# Patient Record
Sex: Female | Born: 1970 | Race: White | Hispanic: No | Smoking: Current every day smoker
Health system: Southern US, Community
[De-identification: ages and names within clinical notes are randomized; demographics above are authoritative.]

## PROBLEM LIST (undated history)

## (undated) DIAGNOSIS — F17200 Nicotine dependence, unspecified, uncomplicated: Secondary | ICD-10-CM

## (undated) DIAGNOSIS — F1910 Other psychoactive substance abuse, uncomplicated: Secondary | ICD-10-CM

## (undated) DIAGNOSIS — D649 Anemia, unspecified: Secondary | ICD-10-CM

## (undated) DIAGNOSIS — J189 Pneumonia, unspecified organism: Secondary | ICD-10-CM

## (undated) DIAGNOSIS — S66819A Strain of other specified muscles, fascia and tendons at wrist and hand level, unspecified hand, initial encounter: Secondary | ICD-10-CM

## (undated) DIAGNOSIS — F3110 Bipolar disorder, current episode manic without psychotic features, unspecified: Secondary | ICD-10-CM

## (undated) DIAGNOSIS — S62102A Fracture of unspecified carpal bone, left wrist, initial encounter for closed fracture: Secondary | ICD-10-CM

## (undated) DIAGNOSIS — F4310 Post-traumatic stress disorder, unspecified: Secondary | ICD-10-CM

## (undated) DIAGNOSIS — F419 Anxiety disorder, unspecified: Secondary | ICD-10-CM

## (undated) DIAGNOSIS — F1911 Other psychoactive substance abuse, in remission: Secondary | ICD-10-CM

## (undated) HISTORY — PX: TUBAL LIGATION: SHX77

## (undated) HISTORY — PX: WRIST SURGERY: SHX841

## (undated) HISTORY — PX: CHOLECYSTECTOMY: SHX55

---

## 1999-04-12 ENCOUNTER — Encounter: Admission: RE | Admit: 1999-04-12 | Discharge: 1999-04-12 | Payer: Self-pay | Admitting: Obstetrics

## 2000-05-14 ENCOUNTER — Emergency Department (HOSPITAL_COMMUNITY): Admission: EM | Admit: 2000-05-14 | Discharge: 2000-05-14 | Payer: Self-pay | Admitting: Internal Medicine

## 2000-05-14 ENCOUNTER — Encounter: Payer: Self-pay | Admitting: Internal Medicine

## 2001-02-02 ENCOUNTER — Emergency Department (HOSPITAL_COMMUNITY): Admission: EM | Admit: 2001-02-02 | Discharge: 2001-02-02 | Payer: Self-pay | Admitting: Emergency Medicine

## 2001-02-02 ENCOUNTER — Encounter: Payer: Self-pay | Admitting: Obstetrics

## 2001-02-03 ENCOUNTER — Ambulatory Visit (HOSPITAL_COMMUNITY): Admission: RE | Admit: 2001-02-03 | Discharge: 2001-02-03 | Payer: Self-pay | Admitting: Surgery

## 2001-02-03 ENCOUNTER — Encounter: Payer: Self-pay | Admitting: Surgery

## 2001-02-04 ENCOUNTER — Inpatient Hospital Stay (HOSPITAL_COMMUNITY): Admission: AD | Admit: 2001-02-04 | Discharge: 2001-02-04 | Payer: Self-pay | Admitting: Obstetrics

## 2001-02-05 ENCOUNTER — Encounter: Payer: Self-pay | Admitting: Surgery

## 2001-02-09 ENCOUNTER — Ambulatory Visit (HOSPITAL_COMMUNITY): Admission: RE | Admit: 2001-02-09 | Discharge: 2001-02-09 | Payer: Self-pay | Admitting: Surgery

## 2001-02-23 ENCOUNTER — Encounter: Payer: Self-pay | Admitting: Surgery

## 2001-02-23 ENCOUNTER — Observation Stay (HOSPITAL_COMMUNITY): Admission: RE | Admit: 2001-02-23 | Discharge: 2001-02-24 | Payer: Self-pay | Admitting: Surgery

## 2001-02-23 ENCOUNTER — Encounter (INDEPENDENT_AMBULATORY_CARE_PROVIDER_SITE_OTHER): Payer: Self-pay

## 2001-08-26 ENCOUNTER — Emergency Department (HOSPITAL_COMMUNITY): Admission: EM | Admit: 2001-08-26 | Discharge: 2001-08-27 | Payer: Self-pay

## 2001-08-28 ENCOUNTER — Emergency Department (HOSPITAL_COMMUNITY): Admission: EM | Admit: 2001-08-28 | Discharge: 2001-08-28 | Payer: Self-pay | Admitting: Emergency Medicine

## 2001-08-28 ENCOUNTER — Encounter: Payer: Self-pay | Admitting: Emergency Medicine

## 2001-09-20 ENCOUNTER — Emergency Department (HOSPITAL_COMMUNITY): Admission: EM | Admit: 2001-09-20 | Discharge: 2001-09-21 | Payer: Self-pay | Admitting: *Deleted

## 2001-09-23 ENCOUNTER — Emergency Department (HOSPITAL_COMMUNITY): Admission: EM | Admit: 2001-09-23 | Discharge: 2001-09-23 | Payer: Self-pay | Admitting: Emergency Medicine

## 2001-09-23 ENCOUNTER — Encounter: Payer: Self-pay | Admitting: Emergency Medicine

## 2001-10-09 ENCOUNTER — Encounter: Payer: Self-pay | Admitting: Emergency Medicine

## 2001-10-09 ENCOUNTER — Inpatient Hospital Stay (HOSPITAL_COMMUNITY): Admission: EM | Admit: 2001-10-09 | Discharge: 2001-10-12 | Payer: Self-pay | Admitting: Emergency Medicine

## 2001-10-20 ENCOUNTER — Encounter: Admission: RE | Admit: 2001-10-20 | Discharge: 2001-10-20 | Payer: Self-pay | Admitting: Internal Medicine

## 2001-12-24 ENCOUNTER — Emergency Department (HOSPITAL_COMMUNITY): Admission: EM | Admit: 2001-12-24 | Discharge: 2001-12-24 | Payer: Self-pay | Admitting: Emergency Medicine

## 2001-12-25 ENCOUNTER — Emergency Department (HOSPITAL_COMMUNITY): Admission: EM | Admit: 2001-12-25 | Discharge: 2001-12-25 | Payer: Self-pay | Admitting: Emergency Medicine

## 2001-12-25 ENCOUNTER — Encounter: Payer: Self-pay | Admitting: Emergency Medicine

## 2002-04-22 ENCOUNTER — Encounter: Payer: Self-pay | Admitting: Emergency Medicine

## 2002-04-22 ENCOUNTER — Emergency Department (HOSPITAL_COMMUNITY): Admission: EM | Admit: 2002-04-22 | Discharge: 2002-04-22 | Payer: Self-pay | Admitting: Emergency Medicine

## 2002-04-29 ENCOUNTER — Encounter: Admission: RE | Admit: 2002-04-29 | Discharge: 2002-04-29 | Payer: Self-pay | Admitting: Internal Medicine

## 2003-11-11 ENCOUNTER — Emergency Department (HOSPITAL_COMMUNITY): Admission: EM | Admit: 2003-11-11 | Discharge: 2003-11-11 | Payer: Self-pay | Admitting: Emergency Medicine

## 2003-11-26 ENCOUNTER — Emergency Department (HOSPITAL_COMMUNITY): Admission: EM | Admit: 2003-11-26 | Discharge: 2003-11-27 | Payer: Self-pay

## 2004-02-26 ENCOUNTER — Emergency Department (HOSPITAL_COMMUNITY): Admission: EM | Admit: 2004-02-26 | Discharge: 2004-02-26 | Payer: Self-pay | Admitting: Family Medicine

## 2005-08-15 ENCOUNTER — Emergency Department (HOSPITAL_COMMUNITY): Admission: EM | Admit: 2005-08-15 | Discharge: 2005-08-15 | Payer: Self-pay | Admitting: Emergency Medicine

## 2006-04-17 ENCOUNTER — Emergency Department (HOSPITAL_COMMUNITY): Admission: EM | Admit: 2006-04-17 | Discharge: 2006-04-17 | Payer: Self-pay | Admitting: Family Medicine

## 2008-04-16 ENCOUNTER — Emergency Department (HOSPITAL_COMMUNITY): Admission: EM | Admit: 2008-04-16 | Discharge: 2008-04-16 | Payer: Self-pay | Admitting: Emergency Medicine

## 2008-07-22 ENCOUNTER — Emergency Department (HOSPITAL_COMMUNITY): Admission: EM | Admit: 2008-07-22 | Discharge: 2008-07-22 | Payer: Self-pay | Admitting: Emergency Medicine

## 2008-07-25 ENCOUNTER — Emergency Department (HOSPITAL_COMMUNITY): Admission: EM | Admit: 2008-07-25 | Discharge: 2008-07-25 | Payer: Self-pay | Admitting: Family Medicine

## 2008-08-12 ENCOUNTER — Emergency Department (HOSPITAL_COMMUNITY): Admission: EM | Admit: 2008-08-12 | Discharge: 2008-08-13 | Payer: Self-pay | Admitting: Emergency Medicine

## 2008-08-30 ENCOUNTER — Emergency Department (HOSPITAL_COMMUNITY): Admission: EM | Admit: 2008-08-30 | Discharge: 2008-08-31 | Payer: Self-pay | Admitting: Emergency Medicine

## 2010-01-14 ENCOUNTER — Emergency Department (HOSPITAL_COMMUNITY): Admission: EM | Admit: 2010-01-14 | Discharge: 2010-01-14 | Payer: Self-pay | Admitting: Emergency Medicine

## 2010-04-22 ENCOUNTER — Emergency Department (HOSPITAL_COMMUNITY): Admission: EM | Admit: 2010-04-22 | Discharge: 2010-04-22 | Payer: Self-pay | Admitting: Emergency Medicine

## 2010-08-24 ENCOUNTER — Emergency Department (HOSPITAL_COMMUNITY): Admission: EM | Admit: 2010-08-24 | Discharge: 2010-08-24 | Payer: Self-pay | Admitting: Emergency Medicine

## 2010-12-02 ENCOUNTER — Emergency Department (HOSPITAL_COMMUNITY)
Admission: EM | Admit: 2010-12-02 | Discharge: 2010-12-02 | Payer: Self-pay | Source: Home / Self Care | Admitting: Emergency Medicine

## 2011-01-20 LAB — URINE MICROSCOPIC-ADD ON

## 2011-01-20 LAB — URINALYSIS, ROUTINE W REFLEX MICROSCOPIC
Glucose, UA: NEGATIVE mg/dL
Specific Gravity, Urine: 1.015 (ref 1.005–1.030)
pH: 5.5 (ref 5.0–8.0)

## 2011-01-20 LAB — PREGNANCY, URINE: Preg Test, Ur: NEGATIVE

## 2011-03-22 NOTE — Op Note (Signed)
[facility]  Patient:    MONTGOMERY, ROTHLISBERGER                       MRN: [id number] Proc. Date: 02/23/01 Adm. Date:  [id number] Disc. Date: [id number] Attending:  Charlton Haws                           Operative Report  PREOPERATIVE DIAGNOSIS:  Chronic calculus cholecystitis.  POSTOPERATIVE DIAGNOSIS:  Chronic calculus cholecystitis.  OPERATION PERFORMED:  Laparoscopic cholecystectomy with the operative cholangiogram.  SURGEON:  Dr. Jamey Ripa.  ASSISTANT:  Dr. Orpah Greek.  ANESTHESIA:  General endotracheal.  CLINICAL HISTORY:  This patient is a 40 year old who presented two weeks ago to the emergency room with abdominal pain and was found to have gallstones plus an active ulcer. She is undergoing therapy for the ulcer and some of her pain is better but some of her postprandial right sided pain has persisted.  DESCRIPTION OF PROCEDURE:  The patient was brought to the operating room and after satisfactory general endotracheal anesthesia had been obtained, the abdomen was prepped and draped. A 0.25% Marcaine was used at each incision. An umbilical incision was made first and the peritoneal cavity entered under direct vision. The Hasson was introduced and the abdomen insufflated to 15. The gallbladder was chronically inflamed with a little chronic thickening around it but there was no evidence of acute inflammation. The remainder of the abdomen looked normal but I was not able to visualize the pelvic organs.  Three additional trocars were placed in the usual positions with the patient in reverse Trendelenburg tilted to the left. The gallbladder was retracted and lifted over the liver. The area around the cystic duct was opened and what appeared to be a cystic duct was fairly large so I spent a long time trying to dissect this out and found the anterior branch of the cystic artery which once I had the clearly identified was clipped and divided to give [place] a  little more mobility. I was able to work my way around the cystic duct and I thought there appeared to be a stone wedged in it and what I thought I had was a very short cystic duct. I opened the peritoneum up on both sides of the gallbladder to make sure I couldnt find the other structures and all I could see was the posterior branch of the cystic artery. Once I thought I had the anatomy adequate identified, I put a clip above where I thought the stone was and then opened this structure and this did appear to be the cystic duct. Using a right angle, I was able to manipulate a single large stone from it and it was probably actually in the ampulla of the gallbladder. To confirm the anatomy, I placed a Reddick catheter in and held it in place with a clip and did an operative cholangiography. This showed what appeared to be a short cystic duct consistent with what the operative findings looked like and good filling of the hepatic ducts, distal common duct and duodenum with no evidence of filling defects.  The catheter was removed and four clips placed on the cystic duct. It was dilated from the stone having been in it and wanted to be sure we had adequate closure. Three clips were placed on the posterior branch of the cystic artery and these were divided. The gallbladder was removed from below to above with  coagulation current of the cautery. Just prior to disconnecting it, we irrigated it and made sure everything was dry. The gallbladder was placed in a bag because there had been some leakage around the cystic duct and I wanted to minimize contamination. This was removed and the gallbladder brought out the umbilical port.  The umbilical port was replaced and we made a final irrigation check for hemostasis and again everything seemed dry. The lateral trocars were removed and there was no bleeding. The umbilical port was closed with the pursestring and it appeared to be dry. The abdomen was  deflated through the epigastric port. The skin was closed with 4-0 monocryl subcuticular and Steri-Strips. The patient tolerated the procedure well. There were no operative complications. All counts were correct. DD:  02/23/01 TD:  02/24/01 Job: [id number]

## 2011-03-22 NOTE — Discharge Summary (Signed)
[place]  Patient:    Regina Daniel, Regina Daniel Visit Number: [id number] MRN: [id number]          Service Type: Attending:  Alvester Morin, M.D. Dictated by:   Ladell Pier, M.D. Adm. Date:  10/10/01 Disc. Date: 10/12/01                             Discharge Summary  ADMISSION DIAGNOSES: 1. Nausea and vomiting. 2. Diarrhea. 3. Substance abuse. 4. Orthostatic hypotension.  DISCHARGE DIAGNOSES: 1. Nausea and vomiting. 2. Diarrhea. 3. Substance abuse. 4. Orthostatic hypotension.  CONSULTATION:  Cardiology.  PROCEDURE:  Transesophageal echocardiogram on October 12, 2001.  HISTORY OF PRESENT ILLNESS:  [name] is a 40 year old, white female with past medical history significant for bipolar.  The patient has been having abdominal pain and nausea with diarrhea for the past four years on an off.  In April 2002, she had a cholecystectomy.  She stated that they told her at the time that she had a stomach ulcer for which she took Prevacid for two months. She came to the emergency department four times in the past three months because of abdominal pain and nausea.  She returned on the night of admission secondary to nausea, vomiting and diarrhea that started over the past 24 hours.  She had no fever, no chills or shortness of breath.  She had some lower right-sided pain that hurts when she breathes.  Her right upper quadrant pain radiated to her right side of her back.  She noted that the pain was sharp.  She also had some decreased appetite and lightheadedness.  PAST MEDICAL HISTORY: 1. Bipolar. 2. Inflammatory bowel disease. 3. Ovarian cyst.  PAST SURGICAL HISTORY: 1. BTL. 2. Cholecystectomy.  FAMILY HISTORY:  The patient was adopted.  SOCIAL HISTORY:  She smoked 1/2 pack a day since she was 40 years old. Occasional alcohol use.  She does cocaine.  She has a daughter and two sons. Her daughter lives with her sister and her two sons live  with her ex-husband. She is unemployed.  She lives with her fiance.  MEDICATIONS: 1. Tegretol.  She states she has not taken her Tegretol in a long time. 2. MiraLax.  ALLERGIES:  No known drug allergies.  REVIEW OF SYSTEMS:  Positive for weakness, headaches, nausea, vomiting, shortness of breath, cough, diarrhea, abdominal pain.  Diarrhea alternated with constipation, change in appetite.  She uses two pillows and has lightheadedness.  PHYSICAL EXAMINATION:  VITAL SIGNS:  Temperature 97.8, blood pressure 108/71, pulse 64, respiratory rate 16 with orthostatic blood pressure 108/54, pulse 59 lying down; sitting blood pressure 114/64, pulse 65; standing up blood pressure 109/64, pulse 82.  GENERAL:  The patient is laying in bed, sleepy, but awakes to voice.  HEENT:  Head normocephalic, atraumatic.  Pupils are equal, round and reactive to light.  Throat with no erythema.  CARDIOVASCULAR:  Regular rate and rhythm with no murmurs, rubs or gallops. S1, S2, no JVD or carotid bruits.  LUNGS:  Clear to auscultation bilaterally.  No wheezes, rhonchi or crackles.  ABDOMEN:  Soft, tender in the right upper quadrant to palpation.  Hypoactive bowel sounds.  RECTAL:  Heme negative, nontender.  PELVIC:  Negative, per ER physician.  EXTREMITIES:  No edema, 2+ DP pulse, 3+ DTRs.  LYMPHATICS:  No lymphadenopathy.  NEUROLOGIC:  Cranial nerves II-XII intact.  Strength 5/5 throughout.  LABORATORY DATA AND X-RAY FINDINGS:  Urine  pregnancy negative.  UA with ketones 15 mg/dl, otherwise normal.  Urine drug screen positive for cocaine. CBC with hemoglobin 13.6/39.6, WBC 8.8, platelets 193, MCV 92.6, neutrophils 81%.  BMP with sodium 136, potassium 3.6, chloride 106, CO2 25, BUN and creatinine 11/0.9, glucose 98, calcium 9.2.  Total protein 6.9, albumin 4.3, AST 26, ALT 21, Alk phos 75, total bilirubin 0.7.  Wet-prep negative.  EKG with normal sinus rhythm, normal axis, no ST segment elevation or  depression. 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  The findings suggest emboli.  Her pelvic CT showed a 2 cm cyst on the left ovary and 15 cm cyst on the right ovary.  There was a small amount of free fluid in the posterior pelvis.  Her TEE with normal LAR, ARV and LV, normal AV, MV, PV and TV.  No evidence for endocarditis.  She had a patent foramen ovale. Therefore, her TEE was normal.  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  It was determined that it is possible that the patient could have embolic source of her infarct of her kidney to her bowel.  She had a TEE done which was negative.  Most likely cause for infarct to kidney and bowel is patients cocaine use causing vasospasm.  The patient was told to discontinue use of her cocaine.  #2 - HYPOTENSION:  The patient was rehydrated during her stay and became normotensive.  Her nausea, vomiting and diarrhea resolved during her stay.  #3 - SUBSTANCE ABUSE:  The patient was offered substance abuse counseling and refused this.  She stated that she needs cocaine to numb her pain.  The patient was advised that she should follow up with me in the outpatient clinic and we will work through this.  If she is still having pain, we would try to manage her pain with pain medication which would work much better for her.  If she continues to use cocaine, she runs the risk of this happening again or something even worse.  DISPOSITION:  Discharged to home with her fiance.  CONDITION ON DISCHARGE:  Stable.  DISCHARGE MEDICATION:  Percocet 20 one p.o. q.6h. as needed for pain.  SPECIAL INSTRUCTIONS:  Do not use cocaine anymore since it is possible that she will have this happen again.   FOLLOWUP:  She  was told to follow up in the office at clinic and she will be called with appointment.  She was given a number, [id number], if she was not called within the next week. Dictated by:   Ladell Pier, M.D. Attending:  Alvester Morin, M.D. DD:  10/12/01 TD:  10/13/01 Job: [id number]

## 2011-03-22 NOTE — Consult Note (Signed)
[facility]  Patient:    Regina Daniel, Regina Daniel                       MRN: [id number] Adm. Date:  [id number] Disc. Date: [id number] Attending:  Doug Sou CC:         Charles A. Clearance Coots, M.D.   Consultation Report  REASON FOR CONSULTATION:  Abdominal pain.  CLINICAL HISTORY:  The patient is a 41 year old who came to the [facility] today to be checked out for abdominal pain.  It has been going on, what she told the nurses there, for one to two weeks and seems to radiate into her back.  It is sometimes associated with some nausea but she was currently hungry and is hungry this evening when I am seeing her at 2145 hours.  Patient actually relates that this pain has been going on off and on for four years.  At one time she was told she had a cyst in her ovary but that that was not the cause of her pain.  When she points, she points to about even with her umbilicus on the right side as the source of her pain but it does radiate up. It is no worse today than yesterday, the day before or last week, is not exacerbated any whatsoever and because she has been chronically complaining of this pain, her father made her come in for evaluation today.  She has had no fever or chills, no diarrhea, etc.  She has not had any other GI complaints.  PAST MEDICAL HISTORY:  She is a gravida 3.  She had a tubal ligation after her last child was born.  She has also had some hand surgery apparently.  PRIOR MEDICATIONS:  None.  ALLERGIES:  None known.  HABITS:  She smokes about a half pack per day and drinks occasional alcohol. The nurses at [facility] got a history positive for marijuana and crack.  REVIEW OF SYSTEMS:  HEENT:  Basically negative.  CHEST:  No cough or shortness of breath.  HEART:  Negative.  ABDOMEN:  Negative except for HPI.  GU: Negative.  EXTREMITIES:  Negative.  PHYSICAL EXAMINATION:  GENERAL:  This patient is a thin, otherwise healthy-appearing  40 year old who is alert, oriented and in no acute distress.  Vital signs have been unremarkable in the emergency room.  Her main complaint currently is that she is hungry and is tired from having been evaluated since 1:00 this afternoon.  HEENT:  Her head is normocephalic.  Eyes are nonicteric.  Pupils are round and regular.  NECK:  Supple.  No masses or thyromegaly.  LUNGS:  Clear to auscultation.  HEART:  Regular.  No murmurs, rubs, or gallops are heard.  ABDOMEN:  Soft.  She does have some tenderness in the right mid-abdomen parallel with the umbilicus.  The right subcostal area seems to be less tender actually.  She has no guarding, no rebound and bowel sounds are normal.  PELVIC:  Pelvic was negative at [facility], not repeated by me.  EXTREMITIES:  Good range of motion.  No evidence of any varicose veins, edema, etc.  LABORATORY DATA:  Laboratory studies are basically negative.  Her WBC is normal.  Liver functions are normal.  Amylase and lipase are normal.  Pelvic ultrasound was basically unremarkable.  Gallbladder ultrasound done here shows cholelithiasis with no evidence of acute cholecystitis.  Gallbladder is not tense or distended, no thick wall, no pericholecystic fluid.  IMPRESSION:  Abdominal pain, probably biliary in origin, although her tenderness seems a little low for gallbladder.  PLAN:  I would like to just get an upper GI and small-bowel follow-through to make sure we do not miss any other GI problem and we will try to set that up for tomorrow and then subsequently try to get her set up for a laparoscopic cholecystectomy.  I have discussed the cholecystectomy with her and gone over indications, risks and complications, etc.  I think all questions have been answered.  I am going to give her ______  Vicodins for pain should she need it in the interval between now and when we get her set up for surgery and she will follow up with our office tomorrow. DD:   02/02/01 TD:  02/03/01 Job: [id number]

## 2011-08-01 LAB — CBC
HCT: 37.8
MCV: 93.1
Platelets: 159
RBC: 4.06
WBC: 8.3

## 2011-08-01 LAB — URINALYSIS, ROUTINE W REFLEX MICROSCOPIC
Bilirubin Urine: NEGATIVE
Ketones, ur: NEGATIVE
Nitrite: NEGATIVE
pH: 6.5

## 2011-08-01 LAB — COMPREHENSIVE METABOLIC PANEL
BUN: 6
CO2: 26
Chloride: 107
Creatinine, Ser: 0.86
GFR calc non Af Amer: >60
Glucose, Bld: 83
Total Bilirubin: 0.6

## 2011-08-01 LAB — DIFFERENTIAL
Basophils Absolute: 0
Lymphocytes Relative: 14
Neutro Abs: 6.3
Neutrophils Relative %: 76

## 2011-08-01 LAB — POCT PREGNANCY, URINE: Operator id: 196461

## 2011-08-01 LAB — URINE MICROSCOPIC-ADD ON

## 2011-08-01 LAB — WET PREP, GENITAL: Yeast Wet Prep HPF POC: NONE SEEN

## 2011-08-01 LAB — GC/CHLAMYDIA PROBE AMP, GENITAL: GC Probe Amp, Genital: NEGATIVE

## 2011-08-05 LAB — COMPREHENSIVE METABOLIC PANEL
ALT: 13
AST: 14
Alkaline Phosphatase: 74
CO2: 26
Calcium: 9.1
Chloride: 102
GFR calc non Af Amer: 55 — ABNORMAL LOW
Glucose, Bld: 90
Sodium: 135
Total Bilirubin: 0.6

## 2011-08-05 LAB — POCT PREGNANCY, URINE: Preg Test, Ur: NEGATIVE

## 2011-08-05 LAB — URINALYSIS, ROUTINE W REFLEX MICROSCOPIC
Bilirubin Urine: NEGATIVE
Glucose, UA: NEGATIVE
Protein, ur: 30 — AB
Protein, ur: NEGATIVE
Specific Gravity, Urine: 1.012
Specific Gravity, Urine: 1.015
pH: 6

## 2011-08-05 LAB — URINE CULTURE

## 2011-08-05 LAB — DIFFERENTIAL
Basophils Absolute: 0
Basophils Relative: 0
Eosinophils Absolute: 0
Eosinophils Relative: 0
Neutrophils Relative %: 79 — ABNORMAL HIGH

## 2011-08-05 LAB — URINE MICROSCOPIC-ADD ON

## 2011-08-05 LAB — CBC
Platelets: 172
WBC: 11.2 — ABNORMAL HIGH

## 2011-08-05 LAB — LIPASE, BLOOD: Lipase: 17

## 2011-11-23 ENCOUNTER — Emergency Department (HOSPITAL_COMMUNITY)
Admission: EM | Admit: 2011-11-23 | Discharge: 2011-11-23 | Disposition: A | Discharge status: Home / Self Care | Payer: Self-pay | Attending: Emergency Medicine | Admitting: Emergency Medicine

## 2011-11-23 DIAGNOSIS — R109 Unspecified abdominal pain: Secondary | ICD-10-CM | POA: Insufficient documentation

## 2011-11-23 DIAGNOSIS — N390 Urinary tract infection, site not specified: Secondary | ICD-10-CM | POA: Insufficient documentation

## 2011-11-23 DIAGNOSIS — R300 Dysuria: Secondary | ICD-10-CM | POA: Insufficient documentation

## 2011-11-23 DIAGNOSIS — R350 Frequency of micturition: Secondary | ICD-10-CM | POA: Insufficient documentation

## 2011-11-23 DIAGNOSIS — R3915 Urgency of urination: Secondary | ICD-10-CM | POA: Insufficient documentation

## 2011-11-23 LAB — CBC
Hemoglobin: 12.5 g/dL (ref 12.0–15.0)
MCH: 32.6 pg (ref 26.0–34.0)
MCHC: 35.2 g/dL (ref 30.0–36.0)
RDW: 12.3 % (ref 11.5–15.5)

## 2011-11-23 LAB — POCT I-STAT, CHEM 8
Creatinine, Ser: 1 mg/dL (ref 0.50–1.10)
Glucose, Bld: 86 mg/dL (ref 70–99)
HCT: 36 % (ref 36.0–46.0)
Hemoglobin: 12.2 g/dL (ref 12.0–15.0)
TCO2: 27 mmol/L (ref 0–100)

## 2011-11-23 LAB — DIFFERENTIAL
Basophils Absolute: 0 10*3/uL (ref 0.0–0.1)
Basophils Relative: 1 % (ref 0–1)
Eosinophils Absolute: 0.2 10*3/uL (ref 0.0–0.7)
Monocytes Absolute: 0.5 10*3/uL (ref 0.1–1.0)
Monocytes Relative: 7 % (ref 3–12)
Neutro Abs: 3.8 10*3/uL (ref 1.7–7.7)
Neutrophils Relative %: 59 % (ref 43–77)

## 2011-11-23 LAB — URINE CULTURE: Colony Count: >=100000

## 2011-11-23 LAB — URINALYSIS, ROUTINE W REFLEX MICROSCOPIC
Bilirubin Urine: NEGATIVE
Ketones, ur: NEGATIVE mg/dL
Nitrite: POSITIVE — AB
Protein, ur: 30 mg/dL — AB

## 2011-11-23 LAB — URINE MICROSCOPIC-ADD ON

## 2011-11-23 MED ORDER — HYDROMORPHONE HCL PF 1 MG/ML IJ SOLN: 0.5000 mg | Freq: Once | INTRAMUSCULAR | Status: DC

## 2011-11-23 MED ORDER — CIPROFLOXACIN HCL 500 MG PO TABS: 500.0000 mg | ORAL_TABLET | Freq: Two times a day (BID) | ORAL | Status: AC

## 2011-11-23 MED ORDER — CIPROFLOXACIN HCL 500 MG PO TABS
500.0000 mg | ORAL_TABLET | Freq: Once | ORAL | Status: AC
  Administered 2011-11-23: 500 mg via ORAL
  Filled 2011-11-23: qty 1

## 2011-11-23 NOTE — ED Provider Notes (Signed)
History/physical exam/procedure(s) were performed by non-physician practitioner and as supervising physician I was immediately available for consultation/collaboration. I have reviewed all notes and am in agreement with care and plan.   Hilario Quarry, MD 11/23/11 [phone]

## 2011-11-23 NOTE — ED Provider Notes (Signed)
History     CSN: [id number]  Arrival date & time 11/23/11  1806   First MD Initiated Contact with Patient 11/23/11 1830      No chief complaint on file.   (Consider location/radiation/quality/duration/timing/severity/associated sxs/prior treatment) Patient is a 41 y.o. female presenting with frequency. The history is provided by the patient.  Urinary Frequency This is a new problem. The current episode started in the past 7 days. The problem occurs constantly. The problem has been gradually worsening. Associated symptoms include abdominal pain. Pertinent negatives include no chills, fever, nausea or vomiting.  Pt states she has had abdominal pressure, her urine is cloudy, and it is burning when she is urinating. Pt denies fever, chills, malaise, flank pain. 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   No past medical history on file.  No past surgical history on file.  No family history on file.  History  Substance Use Topics  . Smoking status: Not on file  . Smokeless tobacco: Not on file  . Alcohol Use: Not on file    OB History    No data available      Review of Systems  Constitutional: Negative for fever and chills.  HENT: Negative.   Eyes: Negative.   Respiratory: Negative.   Cardiovascular: Negative.   Gastrointestinal: Positive for abdominal pain. Negative for nausea and vomiting.  Genitourinary: Positive for dysuria, urgency and frequency. Negative for hematuria, flank pain, vaginal bleeding, vaginal discharge and pelvic pain.  Musculoskeletal: Negative.   Skin: Negative.   Neurological: Negative.   Psychiatric/Behavioral: Negative.     Allergies  Review of patient's allergies indicates no known allergies.  Home Medications   Current Outpatient Rx  Name Route Sig Dispense Refill  . ASPIRIN 81 MG PO TABS Oral Take 160 mg by mouth daily.      BP 90/47  Pulse 65  Temp(Src) 97.7 F (36.5 C)  (Oral)  Resp 16  SpO2 100%  Physical Exam  Nursing note and vitals reviewed. Constitutional: She is oriented to person, place, and time. She appears well-developed and well-nourished. No distress.  HENT:  Head: Normocephalic and atraumatic.  Eyes: Conjunctivae are normal.  Neck: Neck supple.  Cardiovascular: Normal rate, regular rhythm and normal heart sounds.   Pulmonary/Chest: Effort normal and breath sounds normal. No respiratory distress.  Abdominal: Soft. Bowel sounds are normal. She exhibits no distension. There is no tenderness.  Genitourinary:       No cva tenderness  Musculoskeletal: Normal range of motion.  Neurological: She is alert and oriented to person, place, and time.  Skin: Skin is warm and dry.  Psychiatric: She has a normal mood and affect.    ED Course  Procedures (including critical care time)  Labs Reviewed  URINALYSIS, ROUTINE W REFLEX MICROSCOPIC - Abnormal; Notable for the following:    APPearance CLOUDY (*)    Hgb urine dipstick SMALL (*)    Protein, ur 30 (*)    Nitrite POSITIVE (*)    Leukocytes, UA LARGE (*)    All other components within normal limits  CBC - Abnormal; Notable for the following:    RBC 3.83 (*)    HCT 35.5 (*)    All other components within normal limits  URINE MICROSCOPIC-ADD ON - Abnormal; Notable for the following:    Bacteria, UA MANY (*)    All other components within normal limits  DIFFERENTIAL  POCT PREGNANCY, URINE  POCT  I-STAT, CHEM 8  POCT PREGNANCY, URINE  I-STAT, CHEM 8  URINE CULTURE   Urine infected. Normal renal function. WIll treat, started on cipro. Cultures sent. Pt has lowe  Blood pressure, 90/47, however pt states her bp is always this low, and even warned nurse about it, before it was taken the first time. Pt has no sings of pyelonephritis, no vomiting, no flank pain, no fever. WIll d/c   No diagnosis found.    MDM          Lottie Mussel, PA 11/23/11 2044

## 2011-11-23 NOTE — ED Notes (Signed)
Patient stable upon discharge.  

## 2011-11-23 NOTE — ED Notes (Signed)
Pt states " my blood pressure is normally low so don' freak out". Pt placed on cardiac monitor and BP cycling for precaution.

## 2011-11-26 NOTE — ED Notes (Signed)
+   Urine Patient treated with Cipro-sensitive to same-chart appended per protocol MD. 

## 2011-11-28 ENCOUNTER — Encounter (HOSPITAL_COMMUNITY): Payer: Self-pay | Admitting: Emergency Medicine

## 2012-04-19 ENCOUNTER — Encounter (HOSPITAL_COMMUNITY): Payer: Self-pay | Admitting: Adult Health

## 2012-04-19 ENCOUNTER — Emergency Department (HOSPITAL_COMMUNITY)
Admission: EM | Admit: 2012-04-19 | Discharge: 2012-04-19 | Disposition: A | Discharge status: Home / Self Care | Payer: Self-pay | Attending: Emergency Medicine | Admitting: Emergency Medicine

## 2012-04-19 DIAGNOSIS — W108XXA Fall (on) (from) other stairs and steps, initial encounter: Secondary | ICD-10-CM | POA: Insufficient documentation

## 2012-04-19 DIAGNOSIS — F17200 Nicotine dependence, unspecified, uncomplicated: Secondary | ICD-10-CM | POA: Insufficient documentation

## 2012-04-19 DIAGNOSIS — S0181XA Laceration without foreign body of other part of head, initial encounter: Secondary | ICD-10-CM

## 2012-04-19 DIAGNOSIS — S0180XA Unspecified open wound of other part of head, initial encounter: Secondary | ICD-10-CM | POA: Insufficient documentation

## 2012-04-19 HISTORY — DX: Other psychoactive substance abuse, uncomplicated: F19.10

## 2012-04-19 MED ORDER — TETANUS-DIPHTH-ACELL PERTUSSIS 5-2.5-18.5 LF-MCG/0.5 IM SUSP
0.5000 mL | Freq: Once | INTRAMUSCULAR | Status: AC
  Administered 2012-04-19: 0.5 mL via INTRAMUSCULAR
  Filled 2012-04-19: qty 0.5

## 2012-04-19 MED ORDER — OXYMETAZOLINE HCL 0.05 % NA SOLN
1.0000 | Freq: Once | NASAL | Status: AC
  Administered 2012-04-19: 1 via NASAL
  Filled 2012-04-19: qty 15

## 2012-04-19 NOTE — ED Notes (Signed)
C/o fall down 12  Carpeted steps after tripping,  that happened one hour ago denies Loss of consciousness, denies neck pain, denies nausea. LAceration to forehead approx inch, bleeding controlled. Pt alert and oriented, PERRLA.

## 2012-04-19 NOTE — Discharge Instructions (Signed)
Facial Laceration A facial laceration is a cut on the face. It can take 1 to 2 years for the scar to heal completely. HOME CARE  For stitches (sutures):  Keep the cut clean and dry.   If you have a bandage (dressing), change it at least once a day. Change the bandage if it gets wet or dirty, or as told by your doctor.   Wash the cut with soap and water 2 times a day. Rinse the cut with water. Pat it dry with a clean towel.   Put a thin layer of medicated cream on the cut as told by your doctor.   You may shower after the first 24 hours. Do not soak the cut in water until the stitches are removed.   Only take medicines as told by your doctor.   Have your stitches removed as told by your doctor.   Do not wear makeup until a few days after your stitches are removed.  For skin adhesive strips:  Keep the cut clean and dry.   Do not get the strips wet. You may take a bath, but be careful to keep the cut dry.   If the cut gets wet, pat it dry with a clean towel.   The strips will fall off on their own. Do not remove the strips that are still stuck to the cut.  For wound glue:  You may shower or take baths. Do not soak or scrub the cut. Do not swim. Avoid heavy sweating until the glue falls off on its own. After a shower or bath, pat the cut dry with a clean towel.   Do not put medicine or makeup on your cut until the glue falls off.   If you have a bandage, do not put tape over the glue.   Avoid lots of sunlight or tanning lamps until the glue falls off. Put sunscreen on the cut for the first year to reduce your scar.   The glue will fall off on its own. Do not pick at the glue.  You may need a tetanus shot if:  You cannot remember when you had your last tetanus shot.   You have never had a tetanus shot.  If you need a tetanus shot and you choose not to have one, you may get tetanus. Sickness from tetanus can be serious. GET HELP RIGHT AWAY IF:   Your cut gets red, painful,  or puffy (swollen).   There is yellowish-white fluid (pus) coming from the cut.   You have chills or a fever.  MAKE SURE YOU:   Understand these instructions.   Will watch your condition.   Will get help right away if you are not doing well or get worse.  Document Released: 04/08/2008 Document Revised: 10/10/2011 Document Reviewed: 04/16/2011 [employer] Patient Information 2012 [employer]. 

## 2012-04-19 NOTE — ED Provider Notes (Addendum)
History  This chart was scribed for Gwyneth Sprout, MD by Cherlynn Perches. The patient was seen in room STRE8/STRE8. Patient's care was started at 1613.   CSN: [id number]  Arrival date & time 04/19/12  1613   First MD Initiated Contact with Patient 04/19/12 1631      Chief Complaint  Patient presents with  . Head Laceration    (Consider location/radiation/quality/duration/timing/severity/associated sxs/prior treatment) HPI  Regina Daniel is a 41 y.o. female who presents to the Emergency Department complaining of a laceration to her forehead that occurred about 1 hour ago with associated pain localized to the forehead and swelling of the nose. Pt states that she tripped and fell down 12 carpeted steps, hitting her head on the wall. Pt denies LOC, neck pain, nausea, and nose pain. Pt's last tetanus shot was over 10 years ago.   Past Medical History  Diagnosis Date  . Renal disorder   . Drug abuse     History reviewed. No pertinent past surgical history.  History reviewed. No pertinent family history.  History  Substance Use Topics  . Smoking status: Current Everyday Smoker    Types: Cigarettes  . Smokeless tobacco: Not on file  . Alcohol Use: No    OB History    Grav Para Term Preterm Abortions TAB SAB Ect Mult Living                  Review of Systems  Constitutional: Negative for fever and chills.  HENT: Negative for nosebleeds and neck pain.        Head laceration, nose swelling  Respiratory: Negative for shortness of breath.   Gastrointestinal: Negative for nausea and vomiting.  Skin: Positive for wound.  Neurological: Negative for weakness.    Allergies  Review of patient's allergies indicates no known allergies.  Home Medications   Current Outpatient Rx  Name Route Sig Dispense Refill  . ACETAMINOPHEN 500 MG PO TABS Oral Take 1,000 mg by mouth every 6 (six) hours as needed. For pain      BP 117/77  Pulse 94  Temp 98.2 F (36.8 C) (Oral)  Resp  22  SpO2 97%  Physical Exam  Nursing note and vitals reviewed. Constitutional: She is oriented to person, place, and time. She appears well-developed and well-nourished. No distress.  HENT:  Head: Normocephalic and atraumatic.         6 cm jagged wound on forehead, eyes and neck normal   Eyes: EOM are normal. Right eye exhibits no discharge. Left eye exhibits no discharge.  Neck: Neck supple. No tracheal deviation present.  Cardiovascular: Normal rate.   Pulmonary/Chest: Effort normal. No respiratory distress.  Musculoskeletal: Normal range of motion.  Neurological: She is alert and oriented to person, place, and time.  Skin: Skin is warm and dry.  Psychiatric: She has a normal mood and affect. Her behavior is normal.    ED Course  Procedures (including critical care time)  DIAGNOSTIC STUDIES: Oxygen Saturation is 97% on room air, adequate by my interpretation.    COORDINATION OF CARE: 4:37PM - Will suture laceration. Pt agrees with plan.    Labs Reviewed - No data to display No results found. LACERATION REPAIR Performed by: Gwyneth Sprout Authorized byGwyneth Sprout Consent: Verbal consent obtained. Risks and benefits: risks, benefits and alternatives were discussed Consent given by: patient Patient identity confirmed: provided demographic data Prepped and Draped in normal sterile fashion Wound explored  Laceration Location: Right fourth  Laceration Length: 6 cm  No Foreign Bodies seen or palpated  Anesthesia: local infiltration  Local anesthetic: lidocaine 1 % with epinephrine  Anesthetic total: 5 ml  Irrigation method: syringe Amount of cleaning: standard  Skin closure: 6.0 Prolene   Number of sutures: 10   Technique: Simple interrupted   Patient tolerance: Patient tolerated the procedure well with no immediate complications.   1. Facial laceration       MDM   Patient with mechanical fall down the stairs without LOC. She has a  laceration to her forehead but normal vision. Also mild swelling of her nasal bridge but no deformities. There is no deviated septum or septal hematoma. Rest of her exam is within normal limits. Was repaired and tetanus is updated.       Gwyneth Sprout, MD 04/19/12 1710  Gwyneth Sprout, MD 04/19/12 1712

## 2012-04-24 ENCOUNTER — Emergency Department (HOSPITAL_COMMUNITY)
Admission: EM | Admit: 2012-04-24 | Discharge: 2012-04-24 | Disposition: A | Discharge status: Home / Self Care | Payer: Self-pay | Attending: Emergency Medicine | Admitting: Emergency Medicine

## 2012-04-24 ENCOUNTER — Encounter (HOSPITAL_COMMUNITY): Payer: Self-pay | Admitting: Emergency Medicine

## 2012-04-24 DIAGNOSIS — Z4802 Encounter for removal of sutures: Secondary | ICD-10-CM

## 2012-04-24 DIAGNOSIS — F17200 Nicotine dependence, unspecified, uncomplicated: Secondary | ICD-10-CM | POA: Insufficient documentation

## 2012-04-24 NOTE — ED Notes (Signed)
Patient is returning from Sundays vist to have sutures removed from forehead

## 2012-04-24 NOTE — ED Provider Notes (Signed)
History     CSN: [id number]  Arrival date & time 04/24/12  7829   First MD Initiated Contact with Patient 04/24/12 1034      Chief Complaint  Patient presents with  . Suture / Staple Removal    (Consider location/radiation/quality/duration/timing/severity/associated sxs/prior treatment) HPI History from patient. 41 year old female who presents for suture removal. She tripped and fell down some steps on Sunday, 5 days ago, and suffered a laceration to her forehead. She was seen in the ED here and sutures were placed. She reports no drainage or redness to the area. She has not had fever or chills. Denies any nausea or vomiting since the fall. She complains of some intermittent dizziness which seems to be improving.  Past Medical History  Diagnosis Date  . Drug abuse   . Renal disorder     Past Surgical History  Procedure Date  . Cholecystectomy     No family history on file.  History  Substance Use Topics  . Smoking status: Current Everyday Smoker    Types: Cigarettes  . Smokeless tobacco: Not on file  . Alcohol Use: No    OB History    Grav Para Term Preterm Abortions TAB SAB Ect Mult Living                  Review of Systems as per history of present illness  Allergies  Review of patient's allergies indicates no known allergies.  Home Medications   Current Outpatient Rx  Name Route Sig Dispense Refill  . ACETAMINOPHEN 500 MG PO TABS Oral Take 1,000 mg by mouth every 6 (six) hours as needed. For pain      BP 99/40  Pulse 57  Temp 97.9 F (36.6 C) (Oral)  Resp 16  SpO2 100%  Physical Exam  Nursing note and vitals reviewed. Constitutional: She appears well-developed and well-nourished. No distress.  HENT:  Head: Normocephalic and atraumatic.  Neck: Normal range of motion.  Cardiovascular: Normal rate.   Pulmonary/Chest: Effort normal.  Musculoskeletal: Normal range of motion.  Neurological: She is alert.  Skin: Skin is warm and dry. She is not  diaphoretic.       6 cm laceration to the forehead with sutures in place. No drainage from the area. Edges are pink.  Psychiatric: She has a normal mood and affect.    ED Course  SUTURE REMOVAL Performed by: Grant Fontana Authorized by: Grant Fontana Consent: Verbal consent obtained. Risks and benefits: risks, benefits and alternatives were discussed Body area: head/neck Location details: forehead Wound Appearance: clean and pink Sutures Removed: 10 Post-removal: antibiotic ointment applied Facility: sutures placed in this facility Patient tolerance: Patient tolerated the procedure well with no immediate complications.   (including critical care time)   Labs Reviewed - No data to display No results found.   1. Visit for suture removal       MDM  No evidence of infection to the wound. Sutures removed which pt tolerated well. Wound care and return precautions discussed.        Grant Fontana, PA-C 04/24/12 1106

## 2012-04-24 NOTE — ED Provider Notes (Signed)
Medical screening examination/treatment/procedure(s) were performed by non-physician practitioner and as supervising physician I was immediately available for consultation/collaboration.   Loren Racer, MD 04/24/12 [phone]

## 2012-04-24 NOTE — Discharge Instructions (Signed)
Your stitches have been removed. Apply Neosporin or Bacitracin to the area for the next several days to help with wound healing. Apply vitamin E, cocoa butter, or Mederma to help with scarring. Use extra sunscreen on the scar when out in the sun to avoid the scar darkening. If you notice pus from the wound, redness of the surrounding skin, fever, swelling, or other concerns about worsening condition, return to the ED.   Scar Minimization You will have a scar anytime you have surgery and a cut is made in the skin or you have something removed from your skin (mole, skin cancer, cyst). Although scars are unavoidable following surgery, there are ways to minimize their appearance. It is important to follow all the instructions you receive from your caregiver about wound care. How your wound heals will influence the appearance of your scar. If you do not follow the wound care instructions as directed, complications such as infection may occur. Wound instructions include keeping the wound clean, moist, and not letting the wound form a scab. Some people form scars that are raised and lumpy (hypertrophic) or larger than the initial wound (keloidal). HOME CARE INSTRUCTIONS   Follow wound care instructions as directed.   Keep the wound clean by washing it with soap and water.   Keep the wound moist with provided antibiotic cream or petroleum jelly until completely healed. Moisten twice a day for about 2 weeks.   Get stitches (sutures) taken out at the scheduled time.   Avoid touching or manipulating your wound unless needed. Wash your hands thoroughly before and after touching your wound.   Follow all restrictions such as limits on exercise or work. This depends on where your scar is located.   Keep the scar protected from sunburn. Cover the scar with sunscreen/sunblock with SPF 30 or higher.   Gently massage the scar using a circular motion to help minimize the appearance of the scar. Do this only after  the wound has closed and all the sutures have been removed.   For hypertrophic or keloidal scars, there are several ways to treat and minimize their appearance. Methods include compression therapy, intralesional corticosteroids, laser therapy, or surgery. These methods are performed by your caregiver.  Remember that the scar may appear lighter or darker than your normal skin color. This difference in color should even out with time. SEEK MEDICAL CARE IF:   You have a fever.   You develop signs of infection such as pain, redness, pus, and warmth.   You have questions or concerns.  Document Released: 04/10/2010 Document Revised: 10/10/2011 Document Reviewed: 04/10/2010 [facility] Patient Information 2012 [place], [place].

## 2014-04-20 ENCOUNTER — Other Ambulatory Visit: Payer: Self-pay | Admitting: Family Medicine

## 2014-04-20 DIAGNOSIS — R102 Pelvic and perineal pain: Secondary | ICD-10-CM

## 2014-04-22 ENCOUNTER — Other Ambulatory Visit: Payer: Self-pay

## 2014-05-04 ENCOUNTER — Ambulatory Visit
Admission: RE | Admit: 2014-05-04 | Discharge: 2014-05-04 | Disposition: A | Discharge status: Home / Self Care | Payer: No Typology Code available for payment source | Source: Ambulatory Visit | Attending: Family Medicine | Admitting: Family Medicine

## 2014-05-04 DIAGNOSIS — R102 Pelvic and perineal pain: Secondary | ICD-10-CM

## 2014-05-16 ENCOUNTER — Emergency Department (HOSPITAL_COMMUNITY)
Admission: EM | Admit: 2014-05-16 | Discharge: 2014-05-16 | Disposition: A | Discharge status: Home / Self Care | Payer: Self-pay | Attending: Emergency Medicine | Admitting: Emergency Medicine

## 2014-05-16 ENCOUNTER — Encounter (HOSPITAL_COMMUNITY): Payer: Self-pay | Admitting: Emergency Medicine

## 2014-05-16 ENCOUNTER — Emergency Department (HOSPITAL_COMMUNITY): Payer: Self-pay

## 2014-05-16 DIAGNOSIS — IMO0002 Reserved for concepts with insufficient information to code with codable children: Secondary | ICD-10-CM

## 2014-05-16 DIAGNOSIS — Y9389 Activity, other specified: Secondary | ICD-10-CM | POA: Insufficient documentation

## 2014-05-16 DIAGNOSIS — Y929 Unspecified place or not applicable: Secondary | ICD-10-CM | POA: Insufficient documentation

## 2014-05-16 DIAGNOSIS — Z87448 Personal history of other diseases of urinary system: Secondary | ICD-10-CM | POA: Insufficient documentation

## 2014-05-16 DIAGNOSIS — W268XXA Contact with other sharp object(s), not elsewhere classified, initial encounter: Secondary | ICD-10-CM | POA: Insufficient documentation

## 2014-05-16 DIAGNOSIS — S61209A Unspecified open wound of unspecified finger without damage to nail, initial encounter: Secondary | ICD-10-CM | POA: Insufficient documentation

## 2014-05-16 DIAGNOSIS — F17200 Nicotine dependence, unspecified, uncomplicated: Secondary | ICD-10-CM | POA: Insufficient documentation

## 2014-05-16 MED ORDER — OXYCODONE-ACETAMINOPHEN 5-325 MG PO TABS
1.0000 | ORAL_TABLET | Freq: Once | ORAL | Status: AC
  Administered 2014-05-16: 1 via ORAL
  Filled 2014-05-16: qty 1

## 2014-05-16 NOTE — Discharge Instructions (Signed)

## 2014-05-16 NOTE — ED Provider Notes (Signed)
CSN: [id number]     Arrival date & time 05/16/14  1447 History  This chart was scribed for non-physician practitioner, Roxy Horsemanobert Yanna Leaks, PA-C working with Derwood KaplanAnkit Nanavati, MD by Greggory StallionKayla Andersen, ED scribe. This patient was seen in room TR11C/TR11C and the patient's care was started at 4:18 PM.   Chief Complaint  Patient presents with  . finger laceration    The history is provided by the patient. No language interpreter was used.   HPI Comments: Regina Daniel is a 43 y.o. female who presents to the Emergency Department complaining of left index finger laceration that occurred about one hour ago. Pt was cutting hedges, slipped and cut her finger with the hedge trimmer. Mild pain.  No radiating symptoms. States her last tetanus has been within the last five years. Pt is right hand dominant.   Past Medical History  Diagnosis Date  . Drug abuse   . Renal disorder    Past Surgical History  Procedure Laterality Date  . Cholecystectomy     No family history on file. History  Substance Use Topics  . Smoking status: Current Every Day Smoker    Types: Cigarettes  . Smokeless tobacco: Not on file  . Alcohol Use: No   OB History   Grav Para Term Preterm Abortions TAB SAB Ect Mult Living                 Review of Systems  Constitutional: Negative for fever.  HENT: Negative for congestion.   Eyes: Negative for redness.  Respiratory: Negative for shortness of breath.   Cardiovascular: Negative for chest pain.  Gastrointestinal: Negative for abdominal distention.  Musculoskeletal: Negative for gait problem.  Skin: Positive for wound.  Neurological: Negative for speech difficulty.  Psychiatric/Behavioral: Negative for confusion.   Allergies  Review of patient's allergies indicates no known allergies.  Home Medications   Prior to Admission medications   Medication Sig Start Date End Date Taking? Authorizing Provider  acetaminophen (TYLENOL) 500 MG tablet Take 1,000 mg by mouth every 6  (six) hours as needed. For pain    Historical Provider, MD   BP 126/84  Pulse 81  Temp(Src) 97.9 F (36.6 C) (Oral)  Resp 18  Ht 5\' 2"  (1.575 m)  Wt 115 lb (52.164 kg)  BMI 21.03 kg/m2  SpO2 99%  Physical Exam  Nursing note and vitals reviewed. Constitutional: She is oriented to person, place, and time. She appears well-developed and well-nourished. No distress.  HENT:  Head: Normocephalic and atraumatic.  Eyes: Conjunctivae and EOM are normal.  Cardiovascular: Normal rate and regular rhythm.   Pulmonary/Chest: Effort normal and breath sounds normal. No stridor. No respiratory distress.  Abdominal: She exhibits no distension.  Musculoskeletal: She exhibits no edema.  Neurological: She is alert and oriented to person, place, and time. No cranial nerve deficit.  Skin: Skin is warm and dry.  Left index finger remarkable for a 1 cm jagged laceration over the palmar aspect of the DIP, no evidence of tendon or ligament involvement. No retained foreign body. Bleeding is controlled.   Psychiatric: She has a normal mood and affect.    ED Course  Procedures (including critical care time)  LACERATION REPAIR PROCEDURE NOTE The patient's identification was confirmed and consent was obtained. This procedure was performed by Roxy Horsemanobert Charlynn Salih, PA-C at 5:29 PM. Site: left index finger Sterile procedures observed Anesthetic used (type and amt): 2% lidocaine without epi Suture type/size: 5-0 Vicryl Plus Length: 1 cm # of Sutures: 3  Technique: simple interrupted Complexity: simple Antibx ointment applied Tetanus UTD Site anesthetized, irrigated with NS, explored without evidence of foreign body, wound well approximated, site covered with dry, sterile dressing.  Patient tolerated procedure well without complications. Instructions for care discussed verbally and patient provided with additional written instructions for homecare and f/u.  DIAGNOSTIC STUDIES: Oxygen Saturation is 99% on RA,  normal by my interpretation.    COORDINATION OF CARE: 4:19 PM-Discussed treatment plan which includes cleaning wound, xray and laceration repair with pt at bedside and pt agreed to plan.   Labs Review Labs Reviewed - No data to display  Imaging Review Dg Finger Index Left  05/16/2014   CLINICAL DATA:  Left index finger injury  EXAM: LEFT INDEX FINGER 2+V  COMPARISON:  None  FINDINGS: There is no acute fracture nor dislocation. The interphalangeal joints are intact. No foreign bodies are demonstrated. There is mild soft tissue swelling along the radial aspect of the DIP joint.  IMPRESSION: There is no acute bony abnormality nor evidence retained radiopaque foreign bodies.   Electronically Signed   By: David  [place]   On: 05/16/2014 16:32     EKG Interpretation None      MDM   Final diagnoses:  Laceration      I personally performed the services described in this documentation, which was scribed in my presence. The recorded information has been reviewed and is accurate.  Roxy Horseman, PA-C 05/16/14 2123

## 2014-05-16 NOTE — ED Notes (Signed)
Declined W/C at D/C and was escorted to lobby by RN. 

## 2014-05-16 NOTE — ED Notes (Signed)
Per EMS, patient was cutting hedges and slipped and cut her finger with hedge trimmer.   Patient claims she cut her L index finger.  Patient has wrapped in gauze and bleeding controlled at this time.

## 2014-05-18 NOTE — ED Provider Notes (Signed)
Medical screening examination/treatment/procedure(s) were performed by non-physician practitioner and as supervising physician I was immediately available for consultation/collaboration.   EKG Interpretation None       Monroe Toure, MD 05/18/14 1232 

## 2014-06-13 ENCOUNTER — Emergency Department (HOSPITAL_COMMUNITY)
Admission: EM | Admit: 2014-06-13 | Discharge: 2014-06-13 | Disposition: A | Discharge status: Home / Self Care | Payer: Self-pay | Attending: Emergency Medicine | Admitting: Emergency Medicine

## 2014-06-13 ENCOUNTER — Encounter (HOSPITAL_COMMUNITY): Payer: Self-pay | Admitting: Emergency Medicine

## 2014-06-13 DIAGNOSIS — F17200 Nicotine dependence, unspecified, uncomplicated: Secondary | ICD-10-CM | POA: Insufficient documentation

## 2014-06-13 DIAGNOSIS — Z8742 Personal history of other diseases of the female genital tract: Secondary | ICD-10-CM | POA: Insufficient documentation

## 2014-06-13 DIAGNOSIS — Z4802 Encounter for removal of sutures: Secondary | ICD-10-CM | POA: Insufficient documentation

## 2014-06-13 NOTE — ED Notes (Signed)
Pt left before receiving instructions or signing discharge.

## 2014-06-13 NOTE — Discharge Instructions (Signed)
Call for a follow up appointment with a Family or Primary Care Provider.  °Return if Symptoms worsen.   °Take medication as prescribed.  ° °

## 2014-06-13 NOTE — ED Notes (Signed)
Pt reports 1 month ago, cut her left index finge, had 3 sutures placed, is here to have them removed.

## 2014-06-13 NOTE — ED Provider Notes (Signed)
CSN: [id number]     Arrival date & time 06/13/14  1300 History  This chart was scribed for non-physician practitioner, Mellody DrownLauren Lougenia Morrissey, PA-C working with Gilda Creasehristopher J. Pollina, MD by Greggory StallionKayla Andersen, ED scribe. This patient was seen in room TR06C/TR06C and the patient's care was started at 1:44 PM.   Chief Complaint  Patient presents with  . Suture / Staple Removal   HPI Comments: Elouise Munroember A Wilson is a 43 y.o. female who presents to the Emergency Department for suture removal. Pt had 3 sutures placed to her left index finger on 05/16/14. Denies drainage from the area. She states she was told they were dissolvable sutures so she never came back in for removal. Denies fever, chills.   The history is provided by the patient. No language interpreter was used.    Past Medical History  Diagnosis Date  . Drug abuse   . Renal disorder    Past Surgical History  Procedure Laterality Date  . Cholecystectomy     No family history on file. History  Substance Use Topics  . Smoking status: Current Every Day Smoker    Types: Cigarettes  . Smokeless tobacco: Not on file  . Alcohol Use: No   OB History   Grav Para Term Preterm Abortions TAB SAB Ect Mult Living                 Review of Systems  Constitutional: Negative for fever and chills.  All other systems reviewed and are negative.  Allergies  Other  Home Medications   Prior to Admission medications   Not on File   BP 98/56  Pulse 58  Temp(Src) 98.6 F (37 C) (Oral)  Resp 18  SpO2 99%  Physical Exam  Nursing note and vitals reviewed. Constitutional: She is oriented to person, place, and time. She appears well-developed and well-nourished.  Non-toxic appearance. She does not have a sickly appearance. She does not appear ill. No distress.  HENT:  Head: Normocephalic and atraumatic.  Eyes: Conjunctivae and EOM are normal.  Neck: Neck supple.  Pulmonary/Chest: Effort normal. No respiratory distress.  Musculoskeletal: Normal  range of motion.  Neurological: She is alert and oriented to person, place, and time.  Skin: Skin is warm and dry. She is not diaphoretic.  Three white, intact sutures in place in left index finger. No drainage. No erythema. No tenderness to palpation.   Psychiatric: She has a normal mood and affect. Her behavior is normal.    ED Course  Procedures (including critical care time)  COORDINATION OF CARE: 1:46 PM-Discussed treatment plan which includes suture removal with pt at bedside and pt agreed to plan.   Labs Review Labs Reviewed - No data to display  Imaging Review No results found.   EKG Interpretation None      MDM   Final diagnoses:  Visit for suture removal    Patient presents for suture removal placed approximately one month ago exam no drainage or concern for infection 3 and TAC dissolvable sutures visualizing finger. Nurse to remove sutures.  I personally performed the services described in this documentation, which was scribed in my presence. The recorded information has been reviewed and is accurate.  Mellody DrownLauren Carey Lafon, PA-C 06/16/14 1012

## 2014-06-16 NOTE — ED Provider Notes (Signed)
Medical screening examination/treatment/procedure(s) were performed by non-physician practitioner and as supervising physician I was immediately available for consultation/collaboration.  Christopher J. Pollina, MD 06/16/14 1746 

## 2015-04-18 ENCOUNTER — Encounter (HOSPITAL_COMMUNITY): Payer: Self-pay

## 2015-04-18 ENCOUNTER — Emergency Department (HOSPITAL_COMMUNITY): Payer: 59

## 2015-04-18 ENCOUNTER — Emergency Department (HOSPITAL_COMMUNITY)
Admission: EM | Admit: 2015-04-18 | Discharge: 2015-04-18 | Disposition: A | Discharge status: Home / Self Care | Payer: 59 | Attending: Emergency Medicine | Admitting: Emergency Medicine

## 2015-04-18 DIAGNOSIS — Z87448 Personal history of other diseases of urinary system: Secondary | ICD-10-CM | POA: Diagnosis not present

## 2015-04-18 DIAGNOSIS — S90122A Contusion of left lesser toe(s) without damage to nail, initial encounter: Secondary | ICD-10-CM | POA: Insufficient documentation

## 2015-04-18 DIAGNOSIS — W5519XA Other contact with horse, initial encounter: Secondary | ICD-10-CM | POA: Insufficient documentation

## 2015-04-18 DIAGNOSIS — Z720 Tobacco use: Secondary | ICD-10-CM | POA: Insufficient documentation

## 2015-04-18 DIAGNOSIS — Y998 Other external cause status: Secondary | ICD-10-CM | POA: Insufficient documentation

## 2015-04-18 DIAGNOSIS — Y9289 Other specified places as the place of occurrence of the external cause: Secondary | ICD-10-CM | POA: Insufficient documentation

## 2015-04-18 DIAGNOSIS — Y9389 Activity, other specified: Secondary | ICD-10-CM | POA: Insufficient documentation

## 2015-04-18 DIAGNOSIS — S99922A Unspecified injury of left foot, initial encounter: Secondary | ICD-10-CM | POA: Diagnosis present

## 2015-04-18 NOTE — ED Notes (Signed)
Pt reports onset last night hit left little toe on dresser.  Toe is red and swollen and having pain on lateral side of left foot.  Painful to walk on left foot.

## 2015-04-18 NOTE — ED Provider Notes (Signed)
CSN: [id number]     Arrival date & time 04/18/15  1110 History  This chart was scribed for non-physician practitioner, Teressa Lower, NP, working with Mancel Bale, MD, by Ronney Lion, ED Scribe. This patient was seen in room TR11C/TR11C and the patient's care was started at 11:38 AM.    Chief Complaint  Patient presents with  . Foot Injury   The history is provided by the patient. No language interpreter was used.    HPI Comments: Regina Daniel is a 44 y.o. female who presents to the Emergency Department complaining of constant, severe left fifth toe pain radiating to the lateral side of her foot that began last night after she stubbed her toe on a dresser. Touching it and walking exacerbates the pain. Patient had the same foot stomped on by a horse, although she is unsure whether she fractured it in the past, since she did not seek medical attention for it.  Past Medical History  Diagnosis Date  . Drug abuse   . Renal disorder    Past Surgical History  Procedure Laterality Date  . Cholecystectomy     History reviewed. No pertinent family history. History  Substance Use Topics  . Smoking status: Current Every Day Smoker -- 0.50 packs/day    Types: Cigarettes  . Smokeless tobacco: Not on file  . Alcohol Use: No   OB History    No data available     Review of Systems  Musculoskeletal: Positive for myalgias (left fifth toe).  All other systems reviewed and are negative.   Allergies  Other  Home Medications   Prior to Admission medications   Not on File   BP 98/47 mmHg  Pulse 61  Temp(Src) 97.5 F (36.4 C) (Oral)  Resp 16  Ht 5\' 2"  (1.575 m)  Wt 119 lb 8 oz (54.205 kg)  BMI 21.85 kg/m2  SpO2 97% Physical Exam  Constitutional: She is oriented to person, place, and time. She appears well-developed and well-nourished. No distress.  HENT:  Head: Normocephalic and atraumatic.  Eyes: Conjunctivae and EOM are normal.  Neck: Neck supple. No tracheal deviation  present.  Cardiovascular: Normal rate.   Pulmonary/Chest: Effort normal. No respiratory distress.  Musculoskeletal: Normal range of motion.  Neurological: She is alert and oriented to person, place, and time.  Skin:  Swelling and bruising to the left pinky toe. No gross deformity. Pulses intact  Psychiatric: She has a normal mood and affect. Her behavior is normal.  Nursing note and vitals reviewed.   ED Course  Procedures (including critical care time)  DIAGNOSTIC STUDIES: Oxygen Saturation is 97% on RA, normal by my interpretation.    COORDINATION OF CARE: 11:40 AM - Discussed treatment plan with pt at bedside which includes awaiting XR results and placing left foot in a post-op shoe, and pt agreed to plan.  Imaging Review Dg Foot Complete Left  04/18/2015   CLINICAL DATA:  44 year old female with severe left foot pain maximal at the fifth toe 2 days after blunt trauma on furniture. Initial encounter.  EXAM: LEFT FOOT - COMPLETE 3+ VIEW  COMPARISON:  None.  FINDINGS: Bone mineralization is within normal limits. Calcaneus intact. Joint spaces and alignment within normal limits. Metacarpals and phalanges appear intact. No fracture or dislocation identified. No arthropathic changes identified.  IMPRESSION: Negative radiographic appearance of the left foot.   Electronically Signed   By: Odessa Fleming M.D.   On: 04/18/2015 11:35    MDM   Final diagnoses:  Toe contusion, left, initial encounter   No acute bony abnormality noted. Pt placed in post op shoe for comfort  I personally performed the services described in this documentation, which was scribed in my presence. The recorded information has been reviewed and is accurate.     Teressa Lower, NP 04/18/15 1144  Mancel Bale, MD 04/19/15 [phone]

## 2015-04-18 NOTE — Discharge Instructions (Signed)
Contusion °A contusion is a deep bruise. Contusions are the result of an injury that caused bleeding under the skin. The contusion may turn blue, purple, or yellow. Minor injuries will give you a painless contusion, but more severe contusions may stay painful and swollen for a few weeks.  °CAUSES  °A contusion is usually caused by a blow, trauma, or direct force to an area of the body. °SYMPTOMS  °· Swelling and redness of the injured area. °· Bruising of the injured area. °· Tenderness and soreness of the injured area. °· Pain. °DIAGNOSIS  °The diagnosis can be made by taking a history and physical exam. An X-ray, CT scan, or MRI may be needed to determine if there were any associated injuries, such as fractures. °TREATMENT  °Specific treatment will depend on what area of the body was injured. In general, the best treatment for a contusion is resting, icing, elevating, and applying cold compresses to the injured area. Over-the-counter medicines may also be recommended for pain control. Ask your caregiver what the best treatment is for your contusion. °HOME CARE INSTRUCTIONS  °· Put ice on the injured area. °¨ Put ice in a plastic bag. °¨ Place a towel between your skin and the bag. °¨ Leave the ice on for 15-20 minutes, 3-4 times a day, or as directed by your health care provider. °· Only take over-the-counter or prescription medicines for pain, discomfort, or fever as directed by your caregiver. Your caregiver may recommend avoiding anti-inflammatory medicines (aspirin, ibuprofen, and naproxen) for 48 hours because these medicines may increase bruising. °· Rest the injured area. °· If possible, elevate the injured area to reduce swelling. °SEEK IMMEDIATE MEDICAL CARE IF:  °· You have increased bruising or swelling. °· You have pain that is getting worse. °· Your swelling or pain is not relieved with medicines. °MAKE SURE YOU:  °· Understand these instructions. °· Will watch your condition. °· Will get help right  away if you are not doing well or get worse. °Document Released: 07/31/2005 Document Revised: 10/26/2013 Document Reviewed: 08/26/2011 °[employer]® Patient Information ©2015 [employer]. This information is not intended to replace advice given to you by your health care provider. Make sure you discuss any questions you have with your health care provider. ° °

## 2015-06-10 ENCOUNTER — Encounter (HOSPITAL_COMMUNITY): Payer: Self-pay | Admitting: Emergency Medicine

## 2015-06-10 ENCOUNTER — Emergency Department (HOSPITAL_COMMUNITY)
Admission: EM | Admit: 2015-06-10 | Discharge: 2015-06-10 | Disposition: A | Discharge status: Home / Self Care | Payer: 59 | Attending: Emergency Medicine | Admitting: Emergency Medicine

## 2015-06-10 DIAGNOSIS — Z87448 Personal history of other diseases of urinary system: Secondary | ICD-10-CM | POA: Diagnosis not present

## 2015-06-10 DIAGNOSIS — S299XXA Unspecified injury of thorax, initial encounter: Secondary | ICD-10-CM | POA: Diagnosis not present

## 2015-06-10 DIAGNOSIS — Y9289 Other specified places as the place of occurrence of the external cause: Secondary | ICD-10-CM | POA: Diagnosis not present

## 2015-06-10 DIAGNOSIS — W01198A Fall on same level from slipping, tripping and stumbling with subsequent striking against other object, initial encounter: Secondary | ICD-10-CM | POA: Diagnosis not present

## 2015-06-10 DIAGNOSIS — R51 Headache: Secondary | ICD-10-CM

## 2015-06-10 DIAGNOSIS — Z720 Tobacco use: Secondary | ICD-10-CM | POA: Diagnosis not present

## 2015-06-10 DIAGNOSIS — Y93E5 Activity, floor mopping and cleaning: Secondary | ICD-10-CM | POA: Diagnosis not present

## 2015-06-10 DIAGNOSIS — S0990XA Unspecified injury of head, initial encounter: Secondary | ICD-10-CM | POA: Diagnosis present

## 2015-06-10 DIAGNOSIS — M546 Pain in thoracic spine: Secondary | ICD-10-CM

## 2015-06-10 DIAGNOSIS — R519 Headache, unspecified: Secondary | ICD-10-CM

## 2015-06-10 DIAGNOSIS — Y990 Civilian activity done for income or pay: Secondary | ICD-10-CM | POA: Diagnosis not present

## 2015-06-10 MED ORDER — HYDROCODONE-ACETAMINOPHEN 5-325 MG PO TABS
1.0000 | ORAL_TABLET | ORAL | Status: DC | PRN
Start: 2015-06-10 — End: 2015-11-11

## 2015-06-10 MED ORDER — METHOCARBAMOL 500 MG PO TABS: 500.0000 mg | ORAL_TABLET | Freq: Two times a day (BID) | ORAL | Status: DC

## 2015-06-10 MED ORDER — HYDROCODONE-ACETAMINOPHEN 5-325 MG PO TABS
1.0000 | ORAL_TABLET | Freq: Once | ORAL | Status: AC
  Administered 2015-06-10: 1 via ORAL
  Filled 2015-06-10: qty 1

## 2015-06-10 MED ORDER — IBUPROFEN 800 MG PO TABS: 800.0000 mg | ORAL_TABLET | Freq: Three times a day (TID) | ORAL | Status: DC

## 2015-06-10 MED ORDER — METHOCARBAMOL 500 MG PO TABS
500.0000 mg | ORAL_TABLET | Freq: Once | ORAL | Status: AC
  Administered 2015-06-10: 500 mg via ORAL
  Filled 2015-06-10: qty 1

## 2015-06-10 NOTE — ED Provider Notes (Signed)
CSN: [id number]     Arrival date & time 06/10/15  1346 History   First MD Initiated Contact with Patient 06/10/15 1427     Chief Complaint  Patient presents with  . Fall    slipped on wet floor  . Back Pain    landed on back  . Headache    hit head on door during fall     HPI   Regina Daniel is a 44 year old female who presents to the ED with back pain and headache after a mechanical fall this morning. 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 She denies loss of consciousness and states she remembers the whole event. She reports movement exacerbates her back pain and that her headache has been constant since this morning and has not worsened. She has not tried anything for symptom relief. She denies weakness, numbness, paresthesia, dizziness, light headedness, syncope, bowel/bladder incontinence, saddle anesthesia, changes in visual acuity, neck pain.    Past Medical History  Diagnosis Date  . Drug abuse   . Renal disorder    Past Surgical History  Procedure Laterality Date  . Cholecystectomy     No family history on file. History  Substance Use Topics  . Smoking status: Current Every Day Smoker -- 0.50 packs/day    Types: Cigarettes  . Smokeless tobacco: Not on file  . Alcohol Use: No   OB History    No data available     Review of Systems  HENT: Negative for ear discharge and rhinorrhea.        Denies rhinorrhea, otorrhea.  Eyes: Negative for photophobia and visual disturbance.  Respiratory: Negative for shortness of breath.   Cardiovascular: Negative for chest pain and palpitations.  Gastrointestinal: Negative for nausea, vomiting and abdominal pain.  Musculoskeletal: Positive for myalgias and back pain. Negative for gait problem, neck pain and neck stiffness.       Reports right sided thoracic back pain.  Skin: Negative for color change, rash and  wound.  Neurological: Positive for headaches. Negative for dizziness, syncope, facial asymmetry, speech difficulty, weakness, light-headedness and numbness.    Allergies  Other  Home Medications   Prior to Admission medications   Not on File    BP 119/64 mmHg  Pulse 67  Temp(Src) 98.6 F (37 C) (Oral)  Resp 18  SpO2 98% Physical Exam  Constitutional: She is oriented to person, place, and time. Vital signs are normal. She appears well-developed and well-nourished. No distress.  HENT:  Head: Normocephalic and atraumatic. Head is without raccoon's eyes, without Battle's sign, without abrasion, without contusion and without laceration.  Right Ear: Tympanic membrane, external ear and ear canal normal. No drainage. No hemotympanum.  Left Ear: Tympanic membrane, external ear and ear canal normal. No drainage. No hemotympanum.  Nose: Nose normal. No rhinorrhea or nasal deformity.  Mouth/Throat: Uvula is midline, oropharynx is clear and moist and mucous membranes are normal. No oropharyngeal exudate.  Eyes: Conjunctivae and EOM are normal. Pupils are equal, round, and reactive to light.  Neck: Normal range of motion and full passive range of motion without pain. Neck supple. No spinous process tenderness and no muscular tenderness present.  Cardiovascular: Normal rate, regular rhythm, normal heart sounds and intact distal pulses.   Pulmonary/Chest: Effort normal and breath sounds normal. No respiratory distress. She has no wheezes. She exhibits no  tenderness.  Abdominal: Soft. Normal appearance. She exhibits no distension and no mass. There is no tenderness. There is no rebound and no guarding.  Musculoskeletal: Normal range of motion.  Tenderness to palpation over right thoracic paraspinal muscles. No midline tenderness. No stepoff or deformity.  Lymphadenopathy:    She has no cervical adenopathy.  Neurological: She is alert and oriented to person, place, and time. She has normal strength  and normal reflexes. No cranial nerve deficit or sensory deficit. Coordination normal.  Strength 5/5 in lower extremities bilaterally. Sensation intact. Gait normal.  Skin: Skin is warm and dry. No rash noted. No erythema.  Psychiatric: She has a normal mood and affect. Her behavior is normal. Judgment and thought content normal.  Nursing note and vitals reviewed.   ED Course  Procedures (including critical care time)  Labs Review Labs Reviewed - No data to display  Imaging Review No results found.   EKG Interpretation None      MDM   Final diagnoses:  Right-sided thoracic back pain  Headache in front of head    44 yo female presents with right sided thoracic back pain and headache after mechanical fall this morning while mopping. No LOC. No neurological deficits and normal neuro exam. Strength and sensation intact. TTP over right thoracic paraspinal muscles. No midline tenderness, setp-off, or deformity. Patient can walk but states is painful. No loss of bowel or bladder control. No concern for cauda equina. No fever, chills, weight loss, h/o cancer, IVDU. No midline tenderness, step-off, or deformity. No evidence for fracture. Imaging of spine not indicated at this time. Patient is afebrile with no focal neuro deficits, nuchal rigidity, or change in vision. No evidence for meningitis, SAH, or ICH. Imaging of head not indicated at this time. RICE protocol, anti-inflammatory, muscle relaxant, and pain medicine indicated and discussed with patient. Strict return precautions given. Patient to follow up with PCP in 2 days.  BP 119/64 mmHg  Pulse 67  Temp(Src) 98.6 F (37 C) (Oral)  Resp 18  SpO2 98%         Mady Gemma, PA-C 06/10/15 2001  Melene Plan, DO 06/11/15 [phone]

## 2015-06-10 NOTE — ED Notes (Signed)
Pt A+ox4, reports was mopping a floor and slipped on a wet spot, approx 0930 this AM.  Pt reports landing backwards on her back and hit her head on the door.  Pt denies LOC, remembers entire event, able to stand on her own and continue working the next few hours .  Neuros grossly intact.  MAEI, ambulatory with steady gait.  Speaking full/clear sentences, rr even/un-lab.  NAD.

## 2015-06-10 NOTE — Discharge Instructions (Signed)
1. Medications: vicodin, robaxin, ibuprofen, usual home medications 2. Treatment: rest, drink plenty of fluids, heating pad, back exercises 3. Follow Up: please followup with your primary doctor in 2 days for discussion of your diagnoses and further evaluation after today's visit; if you do not have a primary care doctor use the resource guide provided to find one; please return to the ER for new or worsening symptoms   Back Exercises Back exercises help treat and prevent back injuries. The goal of back exercises is to increase the strength of your abdominal and back muscles and the flexibility of your back. These exercises should be started when you no longer have back pain. Back exercises include:  Pelvic Tilt. Lie on your back with your knees bent. Tilt your pelvis until the lower part of your back is against the floor. Hold this position 5 to 10 sec and repeat 5 to 10 times.  Knee to Chest. Pull first 1 knee up against your chest and hold for 20 to 30 seconds, repeat this with the other knee, and then both knees. This may be done with the other leg straight or bent, whichever feels better.  Sit-Ups or Curl-Ups. Bend your knees 90 degrees. Start with tilting your pelvis, and do a partial, slow sit-up, lifting your trunk only 30 to 45 degrees off the floor. Take at least 2 to 3 seconds for each sit-up. Do not do sit-ups with your knees out straight. If partial sit-ups are difficult, simply do the above but with only tightening your abdominal muscles and holding it as directed.  Hip-Lift. Lie on your back with your knees flexed 90 degrees. Push down with your feet and shoulders as you raise your hips a couple inches off the floor; hold for 10 seconds, repeat 5 to 10 times.  Back arches. Lie on your stomach, propping yourself up on bent elbows. Slowly press on your hands, causing an arch in your low back. Repeat 3 to 5 times. Any initial stiffness and discomfort should lessen with repetition over  time.  Shoulder-Lifts. Lie face down with arms beside your body. Keep hips and torso pressed to floor as you slowly lift your head and shoulders off the floor. Do not overdo your exercises, especially in the beginning. Exercises may cause you some mild back discomfort which lasts for a few minutes; however, if the pain is more severe, or lasts for more than 15 minutes, do not continue exercises until you see your caregiver. Improvement with exercise therapy for back problems is slow.  See your caregivers for assistance with developing a proper back exercise program. Document Released: 11/28/2004 Document Revised: 01/13/2012 Document Reviewed: 08/22/2011 [facility] Patient Information 2015 [place], [place]. This information is not intended to replace advice given to you by your health care provider. Make sure you discuss any questions you have with your health care provider.  Back Pain, Adult Low back pain is very common. About 1 in 5 people have back pain.The cause of low back pain is rarely dangerous. The pain often gets better over time.About half of people with a sudden onset of back pain feel better in just 2 weeks. About 8 in 10 people feel better by 6 weeks.  CAUSES Some common causes of back pain include:  Strain of the muscles or ligaments supporting the spine.  Wear and tear (degeneration) of the spinal discs.  Arthritis.  Direct injury to the back. DIAGNOSIS Most of the time, the direct cause of low back pain is not  known.However, back pain can be treated effectively even when the exact cause of the pain is unknown.Answering your caregiver's questions about your overall health and symptoms is one of the most accurate ways to make sure the cause of your pain is not dangerous. If your caregiver needs more information, he or she may order lab work or imaging tests (X-rays or MRIs).However, even if imaging tests show changes in your back, this usually does not require surgery. HOME CARE  INSTRUCTIONS For many people, back pain returns.Since low back pain is rarely dangerous, it is often a condition that people can learn to [facility] their own.   Remain active. It is stressful on the back to sit or stand in one place. Do not sit, drive, or stand in one place for more than 30 minutes at a time. Take short walks on level surfaces as soon as pain allows.Try to increase the length of time you walk each day.  Do not stay in bed.Resting more than 1 or 2 days can delay your recovery.  Do not avoid exercise or work.Your body is made to move.It is not dangerous to be active, even though your back may hurt.Your back will likely heal faster if you return to being active before your pain is gone.  Pay attention to your body when you bend and lift. Many people have less discomfortwhen lifting if they bend their knees, keep the load close to their bodies,and avoid twisting. Often, the most comfortable positions are those that put less stress on your recovering back.  Find a comfortable position to sleep. Use a firm mattress and lie on your side with your knees slightly bent. If you lie on your back, put a pillow under your knees.  Only take over-the-counter or prescription medicines as directed by your caregiver. Over-the-counter medicines to reduce pain and inflammation are often the most helpful.Your caregiver may prescribe muscle relaxant drugs.These medicines help dull your pain so you can more quickly return to your normal activities and healthy exercise.  Put ice on the injured area.  Put ice in a plastic bag.  Place a towel between your skin and the bag.  Leave the ice on for 15-20 minutes, 03-04 times a day for the first 2 to 3 days. After that, ice and heat may be alternated to reduce pain and spasms.  Ask your caregiver about trying back exercises and gentle massage. This may be of some benefit.  Avoid feeling anxious or stressed.Stress increases muscle tension and  can worsen back pain.It is important to recognize when you are anxious or stressed and learn ways to manage it.Exercise is a great option. SEEK MEDICAL CARE IF:  You have pain that is not relieved with rest or medicine.  You have pain that does not improve in 1 week.  You have new symptoms.  You are generally not feeling well. SEEK IMMEDIATE MEDICAL CARE IF:   You have pain that radiates from your back into your legs.  You develop new bowel or bladder control problems.  You have unusual weakness or numbness in your arms or legs.  You develop nausea or vomiting.  You develop abdominal pain.  You feel faint. Document Released: 10/21/2005 Document Revised: 04/21/2012 Document Reviewed: 02/22/2014 [facility] Patient Information 2015 [place], [place]. This information is not intended to replace advice given to you by your health care provider. Make sure you discuss any questions you have with your health care provider.   Emergency Department Resource Guide 1) Find a Doctor  and Pay Out of Pocket Although you won't have to find out who is covered by your insurance plan, it is a good idea to ask around and get recommendations. You will then need to call the office and see if the doctor you have chosen will accept you as a new patient and what types of options they offer for patients who are self-pay. Some doctors offer discounts or will set up payment plans for their patients who do not have insurance, but you will need to ask so you aren't surprised when you get to your appointment.  2) Contact Your Local Health Department Not all health departments have doctors that can see patients for sick visits, but many do, so it is worth a call to see if yours does. If you don't know where your local health department is, you can check in your phone book. The CDC also has a tool to help you locate your state's health department, and many state websites also have listings of all of their local health  departments.  3) Find a Walk-in Clinic If your illness is not likely to be very severe or complicated, you may want to try a walk in clinic. These are popping up all over the country in pharmacies, drugstores, and shopping centers. They're usually staffed by nurse practitioners or physician assistants that have been trained to treat common illnesses and complaints. They're usually fairly quick and inexpensive. However, if you have serious medical issues or chronic medical problems, these are probably not your best option.  No Primary Care Doctor: - Call Health Connect at  [phone] - they can help you locate a primary care doctor that  accepts your insurance, provides certain services, etc. - Physician Referral Service- [phone]  Chronic Pain Problems: Organization         Address  Phone   Notes  [facility]  [phone] Patients need to be referred by their primary care doctor.   Medication Assistance: Organization         Address  Phone   Notes  [facility] Medication [facility] [address] [place] [place], [place] [address] [phone] --Must be a resident of [facility] -- Must have NO insurance coverage whatsoever (no Medicaid/ Medicare, etc.) -- The pt. MUST have a primary care doctor that directs their care regularly and follows them in the community   [facility]  [phone]   [employer]  [phone]    Agencies that provide inexpensive medical care: Organization         Address  Phone   Notes  [facility]  [phone]   [facility]    [phone]   [facility] [address] [place], [place] [address] [phone]   [facility] [address] [place] [phone]   Planned Parenthood    [phone]   [facility]    [phone]   [facility]  [address], [place] Phone:  [phone], Fax:  [phone] Hours of Operation:  9 am - 6 pm, M-F.  Also accepts Medicaid/Medicare and self-pay.  [facility]  [address], [place] Phone: [phone], Fax: [phone]. Hours of Operation:  8:30 am - 5:30 pm, M-F.  Also accepts Medicaid and self-pay.  [facility] [place] [address] [employer] Phone: [phone]  [facility] [address] Natasha Bence [place], [place] [phone], Ext. [phone] Mondays & Thursdays: 7-9 AM.  First 15 patients are seen on a first come, first serve basis.    Medicaid-accepting [facility] Providers:  Organization         Address  Phone   Notes  [facility] [address], [place] [phone] Also accepts self-pay patients.  [facility] [address] Laurell Josephs [place], [place]  [phone]   [facility] [address], [place] [phone]   [facility] Family [facility] [address] [place] [phone]   Renaye Rakers [address], [place]   [phone] Only accepts [place] Access [place] patients after they have their name applied to their card.   Self-Pay (no insurance) in [facility]:  Organization         Address  Phone   Notes  Sickle Cell Patients, [facility] Internal Medicine [address] [place], [place] [phone]   [facility] [address] [place], [place] [phone]   [facility] [place]  [address], [place] [phone]   [facility]/Dr. Osei-Bonsu  [address], [place] or [address], [place] [phone] Phone number for both [place] and [place] locations is the same.  Urgent Medical and [facility] [address] [place] [phone]   [facility] [address], [place] or [address] [phone]   [facility] [address], [place] [phone], phone; [phone], fax Sees patients 1st and 3rd Saturday of every month.  Must not qualify for public or private insurance (i.e. Medicaid, Medicare, [place] Health Choice, Veterans' Benefits)  Household income should be no more than 200% of the poverty level The clinic cannot treat you if you are pregnant or think you are pregnant  Sexually transmitted diseases are not treated at the clinic.    Dental Care: Organization         Address  Phone  Notes  [facility] [address] [place], [place] [phone] Accepts children up to age 75 who are enrolled in [place] or [place] Health Choice; pregnant women with a Medicaid card; and children who have applied for Medicaid or [place] Health Choice, but were declined, whose parents can pay a reduced fee at time of service.  [facility] Department of [facility]  [address], [place] [phone] Accepts children up to age 70 who are enrolled in [place] or [place] Health Choice; pregnant women with a Medicaid card; and children who have applied for Medicaid or [place] Health Choice, but were declined, whose parents can pay a reduced fee at time of service.  [facility]  [address] [place], [place] [phone] Patients are seen by appointment only. Walk-ins are not accepted. [facility] will see patients 76 years of age and older. Monday - Tuesday (8am-5pm) Most Wednesdays (8:30-5pm) $30 per visit, cash only  [facility]  [address], [facility] [phone] Patients are seen by appointment only. Walk-ins are not accepted. [facility] will see patients 16 years of age and older. One Wednesday Evening (Monthly: Volunteer Based).  $30 per visit, cash  only  [employer] of Dentistry Clinics  [phone] for adults;  Children under age 36, call Graduate Pediatric Dentistry at [phone]. Children aged 28-14, please call [phone] to request a pediatric application.  Dental services are provided in all areas of dental care including fillings, crowns and bridges, complete and partial dentures, implants, gum treatment, root canals, and extractions. Preventive care is also provided. Treatment is provided to both adults and children. Patients are selected via a lottery and there is often a waiting list.   [facility] [address] [place]  [phone] [email or web address]   [facility] [address] [place], [place] [phone], Ext. [phone] Second and Fourth Thursday of each month, opens at 6:30 AM; Clinic ends at 9 AM.  Patients are seen on a first-come first-served basis, and a limited number are seen during each clinic.   [facility]  [address] Ether Griffins [place], [place] [phone]   Eligibility Requirements You must have lived in [place], [place], or [place] counties for at least the last three months.   You cannot be eligible for state or federal sponsored [place], including [employer], [place], or [employer].   You generally cannot be eligible for healthcare insurance through your employer.    How to apply: Eligibility screenings are held every Tuesday and Wednesday afternoon from 1:00 pm until 4:00 pm. You do not need an appointment for the interview!  [facility] [address] [place], [place] [phone]   [facility] Health Department  [phone]   [facility] Health Department  [phone]   [facility] Health Department  [phone]    Behavioral Health Resources in the Community: Intensive Outpatient Programs Organization         Address  Phone  Notes  [facility] [address], [place], [place] [phone]   [facility] Outpatient [address], [place], [place] [phone]   [facility] [address] [place], [place]  [phone]   [facility] [address]  [place], [place] [phone] or [phone]   Substance Abuse Resources Organization         Address  Phone  Notes  [facility]  [phone]   [facility]  [phone]   [facility]  [phone]   Floydene Flock  [phone]   Residential & Outpatient Substance Abuse Program  [phone]   Psychological Services Organization         Address  Phone  Notes  [facility] Behavioral Health  [phone]   [facility] Services  [phone]   [facility] Mental Health [address] [place] [phone] or [phone]    Mobile Crisis Teams Organization         Address  Phone  Notes  [facility], Mobile Crisis Care Unit  [phone]   [facility]  [address] [place], [place] [phone]   Doristine Locks [address] [place] [phone]    Self-Help/Support Groups Organization         Address  Phone             Notes  [facility] [place] - variety of support groups  336- I7437963 Call for more information  Narcotics Anonymous (NA), [facility] [address], [employer] [place]  2 meetings at this location   Statistician  Address  Phone  Notes  [facility] [address]    [place]  [phone]   [facility]  [address] [place] [address], [place], [place] [phone]   [facility] [address] [place], [place] [phone] Admissions: 8am-3pm M-F  [facility] [address]    [place], [place] [phone]   [facility] [address] [place], [place], [place] [phone]   [facility] [address]  [place], [place] [phone]   [facility] - Intensive  Outpatient [address] Laurell Josephs [address], [place], [place] [phone]   [facility] (Addiction Recovery [facility] [address] [place]  [place], [place] [phone] or [phone]   [facility] ([facility]) [address], [place], [place] [phone] Accepts Medicaid  [facility] [address]  [place] [phone] Substance Abuse/Addiction Treatment   [facility] Organization         Address  Phone  Notes  [facility]  5626697188   Angie Fava, PhD [address] Ervin Knack [place], [place]   [phone] or [phone]   [facility]   [address] [place], [place] [phone]   [facility] [address], [place], [place] [phone] Insurance/Medicaid/sponsorship through [facility] and Families [address]                                    [place], [place] [phone] Therapy/tele-psych/case  [facility] [address], [place] [phone]    Dr. Lolly Mustache  [phone]   [facility]  [employer] [facility] Dept. 1) [address] [place] 2) [address] [place] 3)  [address], [place] [phone]  [phone]   [facility] Child Abuse Hotline [phone] or [phone] (After Hours)

## 2015-11-10 DIAGNOSIS — S62102A Fracture of unspecified carpal bone, left wrist, initial encounter for closed fracture: Secondary | ICD-10-CM

## 2015-11-10 HISTORY — DX: Fracture of unspecified carpal bone, left wrist, initial encounter for closed fracture: S62.102A

## 2015-11-11 ENCOUNTER — Encounter (HOSPITAL_COMMUNITY): Payer: Self-pay | Admitting: Emergency Medicine

## 2015-11-11 ENCOUNTER — Emergency Department (HOSPITAL_COMMUNITY): Payer: No Typology Code available for payment source

## 2015-11-11 ENCOUNTER — Emergency Department (HOSPITAL_COMMUNITY)
Admission: EM | Admit: 2015-11-11 | Discharge: 2015-11-11 | Disposition: A | Discharge status: Home / Self Care | Payer: No Typology Code available for payment source | Attending: Emergency Medicine | Admitting: Emergency Medicine

## 2015-11-11 DIAGNOSIS — Y9389 Activity, other specified: Secondary | ICD-10-CM | POA: Insufficient documentation

## 2015-11-11 DIAGNOSIS — S022XXA Fracture of nasal bones, initial encounter for closed fracture: Secondary | ICD-10-CM | POA: Insufficient documentation

## 2015-11-11 DIAGNOSIS — Y998 Other external cause status: Secondary | ICD-10-CM | POA: Insufficient documentation

## 2015-11-11 DIAGNOSIS — Z87448 Personal history of other diseases of urinary system: Secondary | ICD-10-CM | POA: Insufficient documentation

## 2015-11-11 DIAGNOSIS — S52592A Other fractures of lower end of left radius, initial encounter for closed fracture: Secondary | ICD-10-CM | POA: Insufficient documentation

## 2015-11-11 DIAGNOSIS — Y9289 Other specified places as the place of occurrence of the external cause: Secondary | ICD-10-CM | POA: Diagnosis not present

## 2015-11-11 DIAGNOSIS — S62102A Fracture of unspecified carpal bone, left wrist, initial encounter for closed fracture: Secondary | ICD-10-CM

## 2015-11-11 DIAGNOSIS — F17210 Nicotine dependence, cigarettes, uncomplicated: Secondary | ICD-10-CM | POA: Diagnosis not present

## 2015-11-11 DIAGNOSIS — S0993XA Unspecified injury of face, initial encounter: Secondary | ICD-10-CM | POA: Diagnosis present

## 2015-11-11 DIAGNOSIS — Z791 Long term (current) use of non-steroidal anti-inflammatories (NSAID): Secondary | ICD-10-CM | POA: Insufficient documentation

## 2015-11-11 DIAGNOSIS — Z79899 Other long term (current) drug therapy: Secondary | ICD-10-CM | POA: Diagnosis not present

## 2015-11-11 MED ORDER — OXYCODONE-ACETAMINOPHEN 5-325 MG PO TABS
2.0000 | ORAL_TABLET | Freq: Once | ORAL | Status: AC
  Administered 2015-11-11: 2 via ORAL
  Filled 2015-11-11: qty 2

## 2015-11-11 MED ORDER — HYDROCODONE-ACETAMINOPHEN 5-325 MG PO TABS: 2.0000 | ORAL_TABLET | ORAL | Status: DC | PRN
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
## 2015-11-11 NOTE — Discharge Instructions (Signed)
Nasal Fracture A nasal fracture is a break or crack in the bones or cartilage of the nose. Minor breaks do not require treatment. These breaks usually heal on their own after about one month. Serious breaks may require surgery. CAUSES This injury is usually caused by a blunt injury to the nose. This type of injury often occurs from:  Contact sports.  Car accidents.  Falls.  Getting punched. SYMPTOMS Symptoms of this injury include:  Pain.  Swelling of the nose.  Bleeding from the nose.  Bruising around the nose or eyes. This may include having black eyes.  Crooked appearance of the nose. DIAGNOSIS This injury may be diagnosed with a physical exam. The health care provider will gently feel the nose for signs of broken bones. He or she will look inside the nostrils to make sure that there is not a blood-filled swelling on the dividing wall between the nostrils (septal hematoma). X-rays of the nose may not show a nasal fracture even when one is present. In some cases, X-rays or a CT scan may be done 1-5 days after the injury. Sometimes, the health care provider will want to wait until the swelling has gone down. TREATMENT Often, minor fractures that have caused no deformity do not require treatment. More serious fractures in which bones have moved out of position may require surgery, which will take place after the swelling is gone. Surgery will stabilize and align the fracture. In some cases, a health care provider may be able to reposition the bones without surgery. This may be done in the health care provider's office after medicine is given to numb the area (local anesthetic). HOME CARE INSTRUCTIONS  If directed, apply ice to the injured area:  Put ice in a plastic bag.  Place a towel between your skin and the bag.  Leave the ice on for 20 minutes, 2-3 times per day.  Take over-the-counter and prescription medicines only as told by your health care provider.  If your nose  starts to bleed, sit in an upright position while you squeeze the soft parts of your nose against the dividing wall between your nostrils (septum) for 10 minutes.  Try to avoid blowing your nose.  Return to your normal activities as told by your health care provider. Ask your health care provider what activities are safe for you.  Avoid contact sports for 3-4 weeks or as told by your health care provider.  Keep all follow-up visits as told by your health care provider. This is important. SEEK MEDICAL CARE IF:  Your pain increases or becomes severe.  You continue to have nosebleeds.  The shape of your nose does not return to normal within 5 days.  You have pus draining out of your nose. SEEK IMMEDIATE MEDICAL CARE IF:  You have bleeding from your nose that does not stop after you pinch your nostrils closed for 20 minutes and keep ice on your nose.  You have clear fluid draining out of your nose.  You notice a grape-like swelling on the septum. This swelling is a collection of blood (hematoma) that must be drained to help prevent infection.  You have difficulty moving your eyes.  You have repeated vomiting.   This information is not intended to replace advice given to you by your health care provider. Make sure you discuss any questions you have with your health care provider.   Document Released: 10/18/2000 Document Revised: 07/12/2015 Document Reviewed: 11/28/2014 Elsevier Interactive Patient Education [employer].  Wrist Fracture A wrist fracture is a break or crack in one of the bones of your wrist. Your wrist is made up of eight small bones at the palm of your hand (carpal bones) and two long bones that make up your forearm (radius and ulna). CAUSES  A direct blow to the wrist.  Falling on an outstretched hand.  Trauma, such as a car accident or a fall. RISK FACTORS Risk factors for wrist fracture include:  Participating in contact and high-risk sports, such  as skiing, biking, and ice skating.  Taking steroid medicines.  Smoking.  Being female.  Being Caucasian.  Drinking more than three alcoholic beverages per day.  Having low or lowered bone density (osteoporosis or osteopenia).  Age. Older adults have decreased bone density.  Women who have had menopause.  History of previous fractures. SIGNS AND SYMPTOMS Symptoms of wrist fractures include tenderness, bruising, and inflammation. Additionally, the wrist may hang in an odd position or appear deformed. DIAGNOSIS Diagnosis may include:  Physical exam.  X-ray. TREATMENT Treatment depends on many factors, including the nature and location of the fracture, your age, and your activity level. Treatment for wrist fracture can be nonsurgical or surgical. Nonsurgical Treatment A plaster cast or splint may be applied to your wrist if the bone is in a good position. If the fracture is not in good position, it may be necessary for your health care provider to realign it before applying a splint or cast. Usually, a cast or splint will be worn for several weeks. Surgical Treatment Sometimes the position of the bone is so far out of place that surgery is required to apply a device to hold it together as it heals. Depending on the fracture, there are a number of options for holding the bone in place while it heals, such as a cast and metal pins. HOME CARE INSTRUCTIONS  Keep your injured wrist elevated and move your fingers as much as possible.  Do not put pressure on any part of your cast or splint. It may break.  Use a plastic bag to protect your cast or splint from water while bathing or showering. Do not lower your cast or splint into water.  Take medicines only as directed by your health care provider.  Keep your cast or splint clean and dry. If it becomes wet, damaged, or suddenly feels too tight, contact your health care provider right away.  Do not use any tobacco products including  cigarettes, chewing tobacco, or electronic cigarettes. Tobacco can delay bone healing. If you need help quitting, ask your health care provider.  Keep all follow-up visits as directed by your health care provider. This is important.  Ask your health care provider if you should take supplements of calcium and vitamins C and D to promote bone healing. SEEK MEDICAL CARE IF:  Your cast or splint is damaged, breaks, or gets wet.  You have a fever.  You have chills.  You have continued severe pain or more swelling than you did before the cast was put on. SEEK IMMEDIATE MEDICAL CARE IF:  Your hand or fingernails on the injured arm turn blue or gray, or feel cold or numb.  You have decreased feeling in the fingers of your injured arm. MAKE SURE YOU:  Understand these instructions.  Will watch your condition.  Will get help right away if you are not doing well or get worse.   This information is not intended to replace advice given to you by  your health care provider. Make sure you discuss any questions you have with your health care provider.   Document Released: 07/31/2005 Document Revised: 07/12/2015 Document Reviewed: 11/08/2011 Elsevier Interactive Patient Education [employer] Elsevier Inc.
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
## 2015-11-11 NOTE — Progress Notes (Signed)
Orthopedic Tech Progress Note Patient Details:  Regina Daniel 1971-08-06 [id number] Applied fiberglass volar short arm splint to LUE.  Pulses, sensation, motion intact before and after splinting.  Capillary refill less than 2 seconds before and after splinting.  Placed splinted LUE in arm sling. Ortho Devices Type of Ortho Device: Arm sling, Volar splint Ortho Device/Splint Location: LUE Ortho Device/Splint Interventions: Application   Lesle ChrisGilliland, Meegan Shanafelt L 11/11/2015, 6:34 AM

## 2016-01-03 HISTORY — PX: NASAL SEPTUM SURGERY: SHX37

## 2016-03-20 ENCOUNTER — Other Ambulatory Visit: Payer: Self-pay | Admitting: Physician Assistant

## 2016-03-20 ENCOUNTER — Other Ambulatory Visit: Payer: Self-pay | Admitting: Orthopaedic Surgery

## 2016-03-21 NOTE — Patient Instructions (Addendum)
Regina Daniel  03/21/2016   Your procedure is scheduled on:   [facility]  [facility] to 3rd floor to  Short Stay Center at 1130 AM.  Call this number if you have problems the morning of surgery [phone]   Remember: ONLY 1 PERSON MAY GO WITH YOU TO SHORT STAY TO GET  READY MORNING OF YOUR SURGERY.  Do not eat food :After Midnight, MAY HAVE CLEAR LIQUIDS FROM MIDNIGHT UNTIL 730 AM DAY OF SURGERY, NOTHING BY MOUTH AFTER 730 AM DAY OF SURGERY.     Take these medicines the morning of surgery with A SIP OF WATER:NONE              You may not have any metal on your body including hair pins and              piercings  Do not wear jewelry, make-up, lotions, powders or perfumes, deodorant             Do not wear nail polish.  Do not shave  48 hours prior to surgery.              Men may shave face and neck.   Do not bring valuables to the hospital. [name] IS NOT             RESPONSIBLE   FOR VALUABLES.  Contacts, dentures or bridgework may not be worn into surgery.  Leave suitcase in the car. After surgery it may be brought to your room.                 Please read over the following fact sheets you were given: _____________________________________________________________________                CLEAR LIQUID DIET   Foods Allowed                                                                     Foods Excluded  Coffee and tea, regular and decaf                             liquids that you cannot  Plain Jell-O in any flavor                                             see through such as: Fruit ices (not with fruit pulp)                                     milk, soups, orange juice  Iced Popsicles                                    All solid food Carbonated beverages, regular and diet  Cranberry, grape and apple juices Sports drinks like Gatorade Lightly seasoned clear  broth or consume(fat free) Sugar, honey syrup  Sample Menu Breakfast                                Lunch                                     Supper Cranberry juice                    Beef broth                            Chicken broth Jell-O                                     Grape juice                           Apple juice Coffee or tea                        Jell-O                                      Popsicle                                                Coffee or tea                        Coffee or tea  _____________________________________________________________________  [facility] - Preparing for Surgery Before surgery, you can play an important role.  Because skin is not sterile, your skin needs to be as free of germs as possible.  You can reduce the number of germs on your skin by washing with CHG (chlorahexidine gluconate) soap before surgery.  CHG is an antiseptic cleaner which kills germs and bonds with the skin to continue killing germs even after washing. Please DO NOT use if you have an allergy to CHG or antibacterial soaps.  If your skin becomes reddened/irritated stop using the CHG and inform your nurse when you arrive at Short Stay. Do not shave (including legs and underarms) for at least 48 hours prior to the first CHG shower.  You may shave your face/neck. Please follow these instructions carefully:  1.  Shower with CHG Soap the night before surgery and the  morning of Surgery.  2.  If you choose to wash your hair, wash your hair first as usual with your  normal  shampoo.  3.  After you shampoo, rinse your hair and body thoroughly to remove the  shampoo.                           4.  Use CHG as you would any other liquid soap.  You can apply chg directly  to the skin and wash  Gently with a scrungie or clean washcloth.  5.  Apply the CHG Soap to your body ONLY FROM THE NECK DOWN.   Do not use on face/ open                           Wound or open  sores. Avoid contact with eyes, ears mouth and genitals (private parts).                       Wash face,  Genitals (private parts) with your normal soap.             6.  Wash thoroughly, paying special attention to the area where your surgery  will be performed.  7.  Thoroughly rinse your body with warm water from the neck down.  8.  DO NOT shower/wash with your normal soap after using and rinsing off  the CHG Soap.                9.  Pat yourself dry with a clean towel.            10.  Wear clean pajamas.            11.  Place clean sheets on your bed the night of your first shower and do not  sleep with pets. Day of Surgery : Do not apply any lotions/deodorants the morning of surgery.  Please wear clean clothes to the hospital/surgery center.  FAILURE TO FOLLOW THESE INSTRUCTIONS MAY RESULT IN THE CANCELLATION OF YOUR SURGERY PATIENT SIGNATURE_________________________________  NURSE SIGNATURE__________________________________  ________________________________________________________________________   Regina Daniel  An incentive spirometer is a tool that can help keep your lungs clear and active. This tool measures how well you are filling your lungs with each breath. Taking long deep breaths may help reverse or decrease the chance of developing breathing (pulmonary) problems (especially infection) following:  A long period of time when you are unable to move or be active. BEFORE THE PROCEDURE   If the spirometer includes an indicator to show your best effort, your nurse or respiratory therapist will set it to a desired goal.  If possible, sit up straight or lean slightly forward. Try not to slouch.  Hold the incentive spirometer in an upright position. INSTRUCTIONS FOR USE  1. Sit on the edge of your bed if possible, or sit up as far as you can in bed or on a chair. 2. Hold the incentive spirometer in an upright position. 3. Breathe out normally. 4. Place the mouthpiece  in your mouth and seal your lips tightly around it. 5. Breathe in slowly and as deeply as possible, raising the piston or the ball toward the top of the column. 6. Hold your breath for 3-5 seconds or for as long as possible. Allow the piston or ball to fall to the bottom of the column. 7. Remove the mouthpiece from your mouth and breathe out normally. 8. Rest for a few seconds and repeat Steps 1 through 7 at least 10 times every 1-2 hours when you are awake. Take your time and take a few normal breaths between deep breaths. 9. The spirometer may include an indicator to show your best effort. Use the indicator as a goal to work toward during each repetition. 10. After each set of 10 deep breaths, practice coughing to be sure your lungs are clear. If you have an incision (the cut made at the time of  surgery), support your incision when coughing by placing a pillow or rolled up towels firmly against it. Once you are able to get out of bed, walk around indoors and cough well. You may stop using the incentive spirometer when instructed by your caregiver.  RISKS AND COMPLICATIONS  Take your time so you do not get dizzy or light-headed.  If you are in pain, you may need to take or ask for pain medication before doing incentive spirometry. It is harder to take a deep breath if you are having pain. AFTER USE  Rest and breathe slowly and easily.  It can be helpful to keep track of a log of your progress. Your caregiver can provide you with a simple table to help with this. If you are using the spirometer at home, follow these instructions: SEEK MEDICAL CARE IF:   You are having difficultly using the spirometer.  You have trouble using the spirometer as often as instructed.  Your pain medication is not giving enough relief while using the spirometer.  You develop fever of 100.5 F (38.1 C) or higher. SEEK IMMEDIATE MEDICAL CARE IF:   You cough up bloody sputum that had not been present  before.  You develop fever of 102 F (38.9 C) or greater.  You develop worsening pain at or near the incision site. MAKE SURE YOU:   Understand these instructions.  Will watch your condition.  Will get help right away if you are not doing well or get worse. Document Released: 03/03/2007 Document Revised: 01/13/2012 Document Reviewed: 05/04/2007 [facility] Patient Information 2014 [place], [place].   ________________________________________________________________________

## 2016-03-25 ENCOUNTER — Encounter (HOSPITAL_COMMUNITY)
Admission: RE | Admit: 2016-03-25 | Discharge: 2016-03-25 | Disposition: A | Discharge status: Home / Self Care | Payer: No Typology Code available for payment source | Source: Ambulatory Visit | Attending: Orthopaedic Surgery | Admitting: Orthopaedic Surgery

## 2016-03-25 ENCOUNTER — Encounter (HOSPITAL_COMMUNITY): Payer: Self-pay

## 2016-03-25 DIAGNOSIS — M80032P Age-related osteoporosis with current pathological fracture, left forearm, subsequent encounter for fracture with malunion: Secondary | ICD-10-CM | POA: Diagnosis not present

## 2016-03-25 DIAGNOSIS — Z01812 Encounter for preprocedural laboratory examination: Secondary | ICD-10-CM | POA: Diagnosis present

## 2016-03-25 HISTORY — DX: Fracture of unspecified carpal bone, left wrist, initial encounter for closed fracture: S62.102A

## 2016-03-25 LAB — CBC
HCT: 42.6 % (ref 36.0–46.0)
Hemoglobin: 14.3 g/dL (ref 12.0–15.0)
MCH: 30.7 pg (ref 26.0–34.0)
MCHC: 33.6 g/dL (ref 30.0–36.0)
MCV: 91.4 fL (ref 78.0–100.0)
PLATELETS: 182 10*3/uL (ref 150–400)
RBC: 4.66 MIL/uL (ref 3.87–5.11)
RDW: 12.5 % (ref 11.5–15.5)
WBC: 6.6 10*3/uL (ref 4.0–10.5)

## 2016-03-25 NOTE — Progress Notes (Signed)
SPOKE WITH [place] [facility] AND MADE AWARE HISTORY OF COCAINE USE LAST USED June 2016, PT DOES NOT NEED URINE DRUG SCREEN MORNING OF SURGERY

## 2016-03-29 ENCOUNTER — Encounter (HOSPITAL_COMMUNITY)
Admission: RE | Disposition: A | Discharge status: Home / Self Care | Payer: Self-pay | Source: Ambulatory Visit | Attending: Orthopaedic Surgery

## 2016-03-29 ENCOUNTER — Encounter (HOSPITAL_COMMUNITY): Payer: Self-pay | Admitting: Anesthesiology

## 2016-03-29 ENCOUNTER — Observation Stay (HOSPITAL_COMMUNITY)
Admission: RE | Admit: 2016-03-29 | Discharge: 2016-03-30 | Disposition: A | Discharge status: Home / Self Care | Payer: No Typology Code available for payment source | Source: Ambulatory Visit | Attending: Orthopaedic Surgery | Admitting: Orthopaedic Surgery

## 2016-03-29 ENCOUNTER — Ambulatory Visit (HOSPITAL_COMMUNITY): Payer: No Typology Code available for payment source | Admitting: Anesthesiology

## 2016-03-29 DIAGNOSIS — S52502P Unspecified fracture of the lower end of left radius, subsequent encounter for closed fracture with malunion: Secondary | ICD-10-CM | POA: Diagnosis not present

## 2016-03-29 DIAGNOSIS — F17210 Nicotine dependence, cigarettes, uncomplicated: Secondary | ICD-10-CM | POA: Insufficient documentation

## 2016-03-29 DIAGNOSIS — M25532 Pain in left wrist: Secondary | ICD-10-CM | POA: Diagnosis present

## 2016-03-29 DIAGNOSIS — S52502S Unspecified fracture of the lower end of left radius, sequela: Secondary | ICD-10-CM

## 2016-03-29 DIAGNOSIS — S52509A Unspecified fracture of the lower end of unspecified radius, initial encounter for closed fracture: Secondary | ICD-10-CM | POA: Diagnosis present

## 2016-03-29 HISTORY — PX: ORIF WRIST FRACTURE: SHX2133

## 2016-03-29 LAB — RAPID URINE DRUG SCREEN, HOSP PERFORMED
AMPHETAMINES: NOT DETECTED
Barbiturates: NOT DETECTED
Benzodiazepines: NOT DETECTED
Cocaine: NOT DETECTED
OPIATES: NOT DETECTED
Tetrahydrocannabinol: NOT DETECTED

## 2016-03-29 SURGERY — OPEN REDUCTION INTERNAL FIXATION (ORIF) WRIST FRACTURE
Anesthesia: General | Site: Wrist | Laterality: Left

## 2016-03-29 MED ORDER — METHOCARBAMOL 500 MG PO TABS: 500.0000 mg | ORAL_TABLET | Freq: Four times a day (QID) | ORAL | Status: DC | PRN

## 2016-03-29 MED ORDER — ONDANSETRON HCL 4 MG/2ML IJ SOLN
4.0000 mg | Freq: Once | INTRAMUSCULAR | Status: AC
  Administered 2016-03-29: 4 mg via INTRAVENOUS

## 2016-03-29 MED ORDER — SODIUM CHLORIDE 0.9 % IR SOLN
Status: AC
  Filled 2016-03-29: qty 1

## 2016-03-29 MED ORDER — ONDANSETRON HCL 4 MG PO TABS: 4.0000 mg | ORAL_TABLET | Freq: Four times a day (QID) | ORAL | Status: DC | PRN

## 2016-03-29 MED ORDER — ACETAMINOPHEN 325 MG PO TABS: 650.0000 mg | ORAL_TABLET | Freq: Four times a day (QID) | ORAL | Status: DC | PRN

## 2016-03-29 MED ORDER — BUPIVACAINE-EPINEPHRINE (PF) 0.5% -1:200000 IJ SOLN
INTRAMUSCULAR | Status: AC
  Filled 2016-03-29: qty 30

## 2016-03-29 MED ORDER — CEFAZOLIN SODIUM-DEXTROSE 2-4 GM/100ML-% IV SOLN
INTRAVENOUS | Status: AC
  Filled 2016-03-29: qty 100

## 2016-03-29 MED ORDER — METOCLOPRAMIDE HCL 5 MG/ML IJ SOLN: 5.0000 mg | Freq: Three times a day (TID) | INTRAMUSCULAR | Status: DC | PRN

## 2016-03-29 MED ORDER — ONDANSETRON HCL 4 MG/2ML IJ SOLN: 4.0000 mg | Freq: Four times a day (QID) | INTRAMUSCULAR | Status: DC | PRN

## 2016-03-29 MED ORDER — CEFAZOLIN SODIUM 1-5 GM-% IV SOLN
1.0000 g | Freq: Four times a day (QID) | INTRAVENOUS | Status: AC
  Administered 2016-03-29 – 2016-03-30 (×3): 1 g via INTRAVENOUS
  Filled 2016-03-29 (×3): qty 50

## 2016-03-29 MED ORDER — PROMETHAZINE HCL 25 MG/ML IJ SOLN
3.1250 mg | Freq: Once | INTRAMUSCULAR | Status: AC
  Administered 2016-03-29: 3.25 mg via INTRAVENOUS

## 2016-03-29 MED ORDER — MEPERIDINE HCL 50 MG/ML IJ SOLN: 6.2500 mg | INTRAMUSCULAR | Status: DC | PRN

## 2016-03-29 MED ORDER — CEFAZOLIN SODIUM-DEXTROSE 2-4 GM/100ML-% IV SOLN
2.0000 g | INTRAVENOUS | Status: AC
  Administered 2016-03-29: 2 g via INTRAVENOUS
  Filled 2016-03-29: qty 100

## 2016-03-29 MED ORDER — PROMETHAZINE HCL 25 MG/ML IJ SOLN
INTRAMUSCULAR | Status: AC
  Filled 2016-03-29: qty 1

## 2016-03-29 MED ORDER — DEXAMETHASONE SODIUM PHOSPHATE 10 MG/ML IJ SOLN
INTRAMUSCULAR | Status: DC | PRN
  Administered 2016-03-29: 10 mg via INTRAVENOUS

## 2016-03-29 MED ORDER — OXYCODONE HCL 5 MG PO TABS
5.0000 mg | ORAL_TABLET | ORAL | Status: DC | PRN
  Administered 2016-03-30: 5 mg via ORAL
  Filled 2016-03-29: qty 1

## 2016-03-29 MED ORDER — LIDOCAINE HCL (CARDIAC) 20 MG/ML IV SOLN
INTRAVENOUS | Status: DC | PRN
  Administered 2016-03-29: 40 mg via INTRATRACHEAL

## 2016-03-29 MED ORDER — DEXAMETHASONE SODIUM PHOSPHATE 10 MG/ML IJ SOLN
INTRAMUSCULAR | Status: AC
  Filled 2016-03-29: qty 1

## 2016-03-29 MED ORDER — BUPIVACAINE-EPINEPHRINE (PF) 0.5% -1:200000 IJ SOLN
INTRAMUSCULAR | Status: DC | PRN
  Administered 2016-03-29: 20 mL via PERINEURAL

## 2016-03-29 MED ORDER — METHOCARBAMOL 1000 MG/10ML IJ SOLN
500.0000 mg | Freq: Four times a day (QID) | INTRAVENOUS | Status: DC | PRN
  Filled 2016-03-29: qty 5

## 2016-03-29 MED ORDER — FENTANYL CITRATE (PF) 250 MCG/5ML IJ SOLN
INTRAMUSCULAR | Status: AC
  Filled 2016-03-29: qty 5

## 2016-03-29 MED ORDER — SODIUM CHLORIDE 0.9 % IR SOLN
Status: DC | PRN
  Administered 2016-03-29: 500 mL

## 2016-03-29 MED ORDER — FENTANYL CITRATE (PF) 100 MCG/2ML IJ SOLN: 25.0000 ug | INTRAMUSCULAR | Status: DC | PRN

## 2016-03-29 MED ORDER — MIDAZOLAM HCL 5 MG/5ML IJ SOLN
INTRAMUSCULAR | Status: DC | PRN
  Administered 2016-03-29 (×2): 1 mg via INTRAVENOUS

## 2016-03-29 MED ORDER — MIDAZOLAM HCL 2 MG/2ML IJ SOLN
INTRAMUSCULAR | Status: AC
  Filled 2016-03-29: qty 2

## 2016-03-29 MED ORDER — ONDANSETRON HCL 4 MG/2ML IJ SOLN
INTRAMUSCULAR | Status: AC
  Filled 2016-03-29: qty 2

## 2016-03-29 MED ORDER — ACETAMINOPHEN 650 MG RE SUPP: 650.0000 mg | Freq: Four times a day (QID) | RECTAL | Status: DC | PRN

## 2016-03-29 MED ORDER — PROPOFOL 10 MG/ML IV BOLUS
INTRAVENOUS | Status: DC | PRN
  Administered 2016-03-29: 100 mg via INTRAVENOUS

## 2016-03-29 MED ORDER — PROPOFOL 10 MG/ML IV BOLUS
INTRAVENOUS | Status: AC
  Filled 2016-03-29: qty 20

## 2016-03-29 MED ORDER — SODIUM CHLORIDE 0.9 % IV SOLN
INTRAVENOUS | Status: DC
  Administered 2016-03-29: 1000 mL via INTRAVENOUS
  Administered 2016-03-30: 05:00:00 via INTRAVENOUS

## 2016-03-29 MED ORDER — LACTATED RINGERS IV SOLN
INTRAVENOUS | Status: DC
Start: 2016-03-29 — End: 2016-03-29
  Administered 2016-03-29: 13:00:00 via INTRAVENOUS

## 2016-03-29 MED ORDER — HYDROMORPHONE HCL 1 MG/ML IJ SOLN: 1.0000 mg | INTRAMUSCULAR | Status: DC | PRN

## 2016-03-29 MED ORDER — LIDOCAINE HCL (CARDIAC) 20 MG/ML IV SOLN
INTRAVENOUS | Status: AC
  Filled 2016-03-29: qty 5

## 2016-03-29 MED ORDER — DIPHENHYDRAMINE HCL 12.5 MG/5ML PO ELIX: 12.5000 mg | ORAL_SOLUTION | ORAL | Status: DC | PRN

## 2016-03-29 MED ORDER — FENTANYL CITRATE (PF) 250 MCG/5ML IJ SOLN
INTRAMUSCULAR | Status: DC | PRN
  Administered 2016-03-29 (×2): 50 ug via INTRAVENOUS

## 2016-03-29 MED ORDER — ONDANSETRON HCL 4 MG/2ML IJ SOLN
INTRAMUSCULAR | Status: DC | PRN
  Administered 2016-03-29: 4 mg via INTRAVENOUS

## 2016-03-29 MED ORDER — METOCLOPRAMIDE HCL 5 MG PO TABS: 5.0000 mg | ORAL_TABLET | Freq: Three times a day (TID) | ORAL | Status: DC | PRN

## 2016-03-29 SURGICAL SUPPLY — 62 items
BAG ZIPLOCK 12X15 (MISCELLANEOUS) ×3 IMPLANT
BANDAGE ACE 4X5 VEL STRL LF (GAUZE/BANDAGES/DRESSINGS) ×3 IMPLANT
BANDAGE ESMARK 6X9 LF (GAUZE/BANDAGES/DRESSINGS) ×1 IMPLANT
BIT DRILL 2.2 SS TIBIAL (BIT) ×3 IMPLANT
BLADE OSCILLATING/SAGITTAL (BLADE) ×2
BLADE SW THK.38XMED LNG THN (BLADE) ×1 IMPLANT
BNDG ESMARK 6X9 LF (GAUZE/BANDAGES/DRESSINGS) ×3
CLOSURE WOUND 1/2 X4 (GAUZE/BANDAGES/DRESSINGS) ×1
CUFF TOURN SGL QUICK 18 (TOURNIQUET CUFF) ×3 IMPLANT
DRAPE C-ARM 42X120 X-RAY (DRAPES) ×3 IMPLANT
DRAPE U-SHAPE 47X51 STRL (DRAPES) ×3 IMPLANT
DRSG ADAPTIC 3X8 NADH LF (GAUZE/BANDAGES/DRESSINGS) ×3 IMPLANT
DRSG PAD ABDOMINAL 8X10 ST (GAUZE/BANDAGES/DRESSINGS) ×3 IMPLANT
DURAPREP 26ML APPLICATOR (WOUND CARE) ×3 IMPLANT
ELECT REM PT RETURN 9FT ADLT (ELECTROSURGICAL) ×3
ELECTRODE REM PT RTRN 9FT ADLT (ELECTROSURGICAL) ×1 IMPLANT
GAUZE SPONGE 4X4 12PLY STRL (GAUZE/BANDAGES/DRESSINGS) ×3 IMPLANT
GAUZE XEROFORM 1X8 LF (GAUZE/BANDAGES/DRESSINGS) ×3 IMPLANT
GLOVE BIO SURGEON STRL SZ 6.5 (GLOVE) ×2 IMPLANT
GLOVE BIO SURGEON STRL SZ7.5 (GLOVE) ×3 IMPLANT
GLOVE BIO SURGEON STRL SZ8.5 (GLOVE) ×3 IMPLANT
GLOVE BIO SURGEONS STRL SZ 6.5 (GLOVE) ×1
GLOVE BIOGEL PI IND STRL 8 (GLOVE) ×1 IMPLANT
GLOVE BIOGEL PI IND STRL 8.5 (GLOVE) ×1 IMPLANT
GLOVE BIOGEL PI INDICATOR 8 (GLOVE) ×2
GLOVE BIOGEL PI INDICATOR 8.5 (GLOVE) ×2
GLOVE ECLIPSE 8.0 STRL XLNG CF (GLOVE) ×3 IMPLANT
GLOVE INDICATOR 6.5 STRL GRN (GLOVE) ×3 IMPLANT
GOWN SPEC L3 XXLG W/TWL (GOWN DISPOSABLE) ×3 IMPLANT
GOWN STRL REUS W/TWL XL LVL3 (GOWN DISPOSABLE) ×9 IMPLANT
K-WIRE 1.6 (WIRE) ×4
K-WIRE FX5X1.6XNS BN SS (WIRE) ×2
KIT BASIN OR (CUSTOM PROCEDURE TRAY) ×3 IMPLANT
KWIRE FX5X1.6XNS BN SS (WIRE) ×2 IMPLANT
PACK ORTHO EXTREMITY (CUSTOM PROCEDURE TRAY) ×3 IMPLANT
PAD CAST 4YDX4 CTTN HI CHSV (CAST SUPPLIES) ×2 IMPLANT
PADDING CAST COTTON 4X4 STRL (CAST SUPPLIES) ×4
PEG LOCKING SMOOTH 2.2X15 (Peg) ×3 IMPLANT
PEG LOCKING SMOOTH 2.2X16 (Screw) ×6 IMPLANT
PLATE NARROW DVR LEFT (Plate) ×3 IMPLANT
POSITIONER SURGICAL ARM (MISCELLANEOUS) ×3 IMPLANT
SCREW LOCK 12X2.7X 3 LD (Screw) ×3 IMPLANT
SCREW LOCK 16X2.7X 3 LD TPR (Screw) ×1 IMPLANT
SCREW LOCK 18X2.7X 3 LD TPR (Screw) ×1 IMPLANT
SCREW LOCK 20X2.7X 3 LD TPR (Screw) ×1 IMPLANT
SCREW LOCKING 2.7X11MM (Screw) ×3 IMPLANT
SCREW LOCKING 2.7X12MM (Screw) ×6 IMPLANT
SCREW LOCKING 2.7X16 (Screw) ×2 IMPLANT
SCREW LOCKING 2.7X18 (Screw) ×2 IMPLANT
SCREW LOCKING 2.7X20MM (Screw) ×2 IMPLANT
SPLINT PLASTER EXTRA FAST 3X15 (CAST SUPPLIES) ×2
SPLINT PLASTER GYPS XFAST 3X15 (CAST SUPPLIES) ×1 IMPLANT
STRIP CLOSURE SKIN 1/2X4 (GAUZE/BANDAGES/DRESSINGS) ×2 IMPLANT
SUT ETHILON 3 0 PS 1 (SUTURE) ×3 IMPLANT
SUT MNCRL AB 4-0 PS2 18 (SUTURE) ×3 IMPLANT
SUT VIC AB 0 CT1 27 (SUTURE) ×4
SUT VIC AB 0 CT1 27XBRD ANTBC (SUTURE) ×2 IMPLANT
SUT VIC AB 2-0 CT1 27 (SUTURE) ×2
SUT VIC AB 2-0 CT1 TAPERPNT 27 (SUTURE) ×1 IMPLANT
TOWEL OR 17X26 10 PK STRL BLUE (TOWEL DISPOSABLE) ×6 IMPLANT
TOWEL OR NON WOVEN STRL DISP B (DISPOSABLE) ×3 IMPLANT
UNDERPAD 30X30 INCONTINENT (UNDERPADS AND DIAPERS) ×6 IMPLANT

## 2016-03-29 NOTE — Anesthesia Procedure Notes (Addendum)
Anesthesia Regional Block:  Infraclavicular brachial plexus block  Pre-Anesthetic Checklist: ,, timeout performed, Correct Patient, Correct Site, Correct Laterality, Correct Procedure, Correct Position, site marked, Risks and benefits discussed,  Surgical consent,  Pre-op evaluation,  At surgeon's request and post-op pain management  Laterality: Left and Upper  Prep: chloraprep       Needles:  Injection technique: Single-shot  Needle Type: Echogenic Stimulator Needle     Needle Length: 5cm 5 cm Needle Gauge: 21 and 21 G    Additional Needles:  Procedures: ultrasound guided (picture in chart) Infraclavicular brachial plexus block Narrative:  Start time: 03/29/2016 12:46 PM End time: 03/29/2016 12:51 PM Injection made incrementally with aspirations every 5 mL.  Performed by: Personally  Anesthesiologist: CREWS, DAVID   Procedure Name: LMA Insertion Date/Time: 03/29/2016 1:06 PM Performed by: Thornell MuleSTUBBLEFIELD, Sherrina Zaugg G Pre-anesthesia Checklist: Patient identified, Emergency Drugs available, Suction available and Patient being monitored Patient Re-evaluated:Patient Re-evaluated prior to inductionOxygen Delivery Method: Circle system utilized Preoxygenation: Pre-oxygenation with 100% oxygen Intubation Type: IV induction LMA: LMA inserted LMA Size: 4.0 Number of attempts: 1 Placement Confirmation: positive ETCO2 Tube secured with: Tape Dental Injury: Teeth and Oropharynx as per pre-operative assessment       Left Infraclavicular block image

## 2016-03-29 NOTE — Progress Notes (Signed)
Assisted Dr Ivin Bootyrews with axillary block with ultrasound. Side rails up, monitors on throughout procedure. See vital signs in flow sheet. Tolerated Procedure well.

## 2016-03-29 NOTE — Transfer of Care (Signed)
Immediate Anesthesia Transfer of Care Note  Patient: Regina Daniel  Procedure(s) Performed: Procedure(s) with comments: LEFT WRIST OSTEOTOMY WITH OPEN REDUCTION INTERNAL FIXATION (ORIF)  (Left) - Left Intra-clavicular block  Patient Location: PACU  Anesthesia Type:General  Level of Consciousness: awake, alert  and oriented  Airway & Oxygen Therapy: Patient Spontanous Breathing and Patient connected to face mask oxygen  Post-op Assessment: Report given to RN and Post -op Vital signs reviewed and stable  Post vital signs: Reviewed and stable  Last Vitals:  Filed Vitals:   03/29/16 1248 03/29/16 1249  BP: 91/63   Pulse: 55 63  Temp:    Resp: 20 22    Last Pain: There were no vitals filed for this visit.       Complications: No apparent anesthesia complications

## 2016-03-29 NOTE — Anesthesia Postprocedure Evaluation (Signed)
Anesthesia Post Note  Patient: Hospital doctorAmber A Daniel  Procedure(s) Performed: Procedure(s) (LRB): LEFT WRIST OSTEOTOMY WITH OPEN REDUCTION INTERNAL FIXATION (ORIF)  (Left)  Patient location during evaluation: PACU Anesthesia Type: General Level of consciousness: awake and alert Pain management: pain level controlled Vital Signs Assessment: post-procedure vital signs reviewed and stable Respiratory status: spontaneous breathing, nonlabored ventilation and respiratory function stable Cardiovascular status: blood pressure returned to baseline and stable Postop Assessment: no signs of nausea or vomiting Anesthetic complications: no    Last Vitals:  Filed Vitals:   03/29/16 1544 03/29/16 1645  BP: 95/43 91/58  Pulse: 55 76  Temp: 36.4 C 36.5 C  Resp: 16 16    Last Pain:  Filed Vitals:   03/29/16 1705  PainSc: 0-No pain                 Aliegha Paullin A

## 2016-03-29 NOTE — Progress Notes (Signed)
Patient stated she has not had any illicit drug use ( Cocaine)  in at least 12 months

## 2016-03-29 NOTE — H&P (Signed)
Regina Daniel is an 45 y.o. female.   Chief Complaint: left wrist pain and decreased motion HPI:   45 yo female with a history of a left distal radius fracture several months ago.  The fracture has since healed, but with significant dorsal angulation that has negatively effected her wrist motion and is causing considerable discomfort.  Past Medical History  Diagnosis Date  . Drug abuse   . Renal disorder     RIGHT KIDNEY WAS DETERORRITING DUE TO DRUG ABUSE, PT TOLD THIS YEARS AGO  . Left wrist fracture JAN 6.2017  . 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 Cholecystectomy    . Nasal septum surgery  MARCH 2017  . Tubal ligation    . Wrist surgery Right YEARS AGO    TENDON REPAIR    No family history on file. Social History:  reports that she has been smoking Cigarettes.  She has a 16 pack-year smoking history. She has never used smokeless tobacco. She reports that she uses illicit drugs (Cocaine). She reports that she does not drink alcohol.  Allergies:  Allergies  Allergen Reactions  . Other Nausea And Vomiting    2 antibiotics cause her to get sick. She cannot remember the name of them. [facility] and they don't have any allergy information on file.     No prescriptions prior to admission    No results found for this or any previous visit (from the past 48 hour(s)). No results found.  Review of Systems  All other systems reviewed and are negative.   There were no vitals taken for this visit. Physical Exam  Constitutional: She is oriented to person, place, and time. She appears well-developed and well-nourished.  HENT:  Head: Normocephalic and atraumatic.  Eyes: EOM are normal. Pupils are equal, round, and reactive to light.  Neck: Normal range of motion. Neck supple.  Cardiovascular: Normal rate and regular rhythm.   Respiratory: Effort normal and breath  sounds normal.  GI: Soft. Bowel sounds are normal.  Musculoskeletal:       Left wrist: She exhibits decreased range of motion, tenderness, bony tenderness and deformity.  Neurological: She is alert and oriented to person, place, and time.  Skin: Skin is warm and dry.  Psychiatric: She has a normal mood and affect.     Assessment/Plan Left distal radius fracture malunion 1)  To the OR today for a corrective osteotomy and plating of the left distal radius to improve left wrist function.  Risks and benefits have been discussed in detail.  Admission overnight.  [name],Treyden Hakim Y, MD 03/29/2016, 7:21 AM

## 2016-03-29 NOTE — Anesthesia Preprocedure Evaluation (Addendum)
Anesthesia Evaluation  Patient identified by MRN, date of birth, ID band Patient awake    Reviewed: Allergy & Precautions, NPO status , Patient's Chart, lab work & pertinent test results  Airway Mallampati: I  TM Distance: >3 FB Neck ROM: Full    Dental  (+) Teeth Intact, Dental Advisory Given   Pulmonary Current Smoker,    breath sounds clear to auscultation       Cardiovascular  Rhythm:Regular Rate:Normal     Neuro/Psych    GI/Hepatic   Endo/Other    Renal/GU      Musculoskeletal   Abdominal   Peds  Hematology   Anesthesia Other Findings   Reproductive/Obstetrics                            Anesthesia Physical Anesthesia Plan  ASA: II  Anesthesia Plan: General   Post-op Pain Management: GA combined w/ Regional for post-op pain   Induction: Intravenous  Airway Management Planned: LMA  Additional Equipment:   Intra-op Plan:   Post-operative Plan: Extubation in OR  Informed Consent: I have reviewed the patients History and Physical, chart, labs and discussed the procedure including the risks, benefits and alternatives for the proposed anesthesia with the patient or authorized representative who has indicated his/her understanding and acceptance.   Dental advisory given  Plan Discussed with: CRNA, Anesthesiologist and Surgeon  Anesthesia Plan Comments:         Anesthesia Quick Evaluation

## 2016-03-29 NOTE — Brief Op Note (Signed)
03/29/2016  2:27 PM  PATIENT:  Elouise MunroeAmber A Wilson  45 y.o. female  PRE-OPERATIVE DIAGNOSIS:  left wrist fracture malunion  POST-OPERATIVE DIAGNOSIS:  left wrist fracture malunion  PROCEDURE:  Procedure(s) with comments: LEFT WRIST OSTEOTOMY WITH OPEN REDUCTION INTERNAL FIXATION (ORIF)  (Left) - Left Intra-clavicular block  SURGEON:  Surgeon(s) and Role:    * Kathryne Hitchhristopher Y Thereasa Iannello, MD - Primary  PHYSICIAN ASSISTANT: Rexene EdisonGil Clark, PA-C  ANESTHESIA:   regional and general  EBL:   minimal  COUNTS:  YES  TOURNIQUET:  * Missing tourniquet times found for documented tourniquets in log:  [id number] *  DICTATION: .Other Dictation: Dictation Number [id number]  PLAN OF CARE: Admit to inpatient   PATIENT DISPOSITION:  PACU - hemodynamically stable.   Delay start of Pharmacological VTE agent (>24hrs) due to surgical blood loss or risk of bleeding: no

## 2016-03-30 DIAGNOSIS — S52502P Unspecified fracture of the lower end of left radius, subsequent encounter for closed fracture with malunion: Secondary | ICD-10-CM | POA: Diagnosis not present

## 2016-03-30 MED ORDER — OXYCODONE-ACETAMINOPHEN 5-325 MG PO TABS: 1.0000 | ORAL_TABLET | ORAL | Status: DC | PRN

## 2016-03-30 NOTE — Progress Notes (Signed)
Discharge instructions reviewed with patient utilizing teach back method. Patient being discharged to home when ride arrives.

## 2016-03-30 NOTE — Discharge Instructions (Signed)
Expect hand swelling - ice and elevation as needed. Keep your splint clean and dry. No heavy lifting with your left hand

## 2016-03-30 NOTE — Care Management Note (Signed)
Case Management Note  Patient Details  Name: Regina Daniel MRN: [id number] Date of Birth: 11-02-1971  Subjective/Objective:     Traumatic closed displaced fracture of distal end of left radius with malunion               Action/Plan: Discharge Planning: AVS reviewed:  NCM spoke to pt and states she lives at with both Mother and Father. Pt states she can afford her medications at home. No DME or HH needed at this time.    Expected Discharge Date:  03/30/2016               Expected Discharge Plan:  Home/Self Care  In-House Referral:  NA  Discharge planning Services  CM Consult  Post Acute Care Choice:  NA Choice offered to:  NA  DME Arranged:  N/A DME Agency:  NA  HH Arranged:  NA HH Agency:  NA  Status of Service:  Completed, signed off  Medicare Important Message Given:    Date Medicare IM Given:    Medicare IM give by:    Date Additional Medicare IM Given:    Additional Medicare Important Message give by:     If discussed at Long Length of Stay Meetings, dates discussed:    Additional Comments:  Elliot CousinShavis, Aranda Bihm Ellen, RN 03/30/2016, 9:22 AM

## 2016-03-30 NOTE — Discharge Summary (Signed)
Patient ID: Regina Daniel MRN: [id number] DOB/AGE: 1971/04/19 45 y.o.  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 Drug abuse   . Renal disorder     RIGHT KIDNEY WAS DETERORRITING DUE TO DRUG ABUSE, PT TOLD THIS YEARS AGO  . Left wrist fracture JAN 6.2017  . 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 female who was admitted 03/29/2016 for operative treatment ofTraumatic closed displaced fracture of distal end of left radius with malunion. Patient has severe unremitting pain that affects sleep, daily activities, and work/hobbies. After pre-op clearance the patient was taken to the operating room on 03/29/2016 and underwent  Procedure(s): LEFT WRIST OSTEOTOMY WITH OPEN REDUCTION INTERNAL FIXATION (ORIF) .    Patient was given perioperative antibiotics: Anti-infectives    Start     Dose/Rate Route Frequency Ordered Stop   03/29/16 2000  ceFAZolin (ANCEF) IVPB 1 g/50 mL premix     1 g 100 mL/hr over 30 Minutes Intravenous Every 6 hours 03/29/16 1558 03/30/16 0909   03/29/16 1321  polymyxin B 500,000 Units, bacitracin 50,000 Units in sodium chloride irrigation 0.9 % 500 mL irrigation  Status:  Discontinued       As needed 03/29/16 1331 03/29/16 1442   03/29/16 1142  ceFAZolin (ANCEF) IVPB 2g/100 mL premix     2 g 200 mL/hr over 30 Minutes Intravenous On call to O.R. 03/29/16 1142 03/29/16 1308       Patient was given sequential compression devices, early ambulation, and chemoprophylaxis to  prevent DVT.  Patient benefited maximally from hospital stay and there were no complications.    Recent vital signs: Patient Vitals for the past 24 hrs:  BP Temp Temp src Pulse Resp SpO2 Height Weight  03/30/16 0749 (!) 88/57 mmHg 97.6 F (36.4 C) Oral (!) 53 16 - - -  03/30/16 0504 - - - (!) 50 - - - -  03/30/16 0503 (!) 80/50 mmHg - - (!) 44 - - - -  03/30/16 0456 (!) 79/46 mmHg 97.7 F (36.5 C) Oral (!) 44 16 96 % - -  03/30/16 0220 (!) 98/52 mmHg 97.8 F (36.6 C) Oral 61 16 96 % - -  03/29/16 2105 (!) 95/54 mmHg 97.7 F (36.5 C) Oral 61 16 98 % - -  03/29/16 1835 (!) 92/42 mmHg 97.8 F (36.6 C) Oral (!) 59 16 97 % - -  03/29/16 1740 97/63 mmHg 97.9 F (36.6 C) Oral (!) 53 16 97 % - -  03/29/16 1645 (!) 91/58 mmHg 97.7 F (36.5 C) Oral 76 16 100 % - -  03/29/16 1544 (!) 95/43 mmHg 97.5 F (36.4 C) - (!) 55 16 96 % - -  03/29/16 1530 114/83 mmHg - - (!) 59 14 98 % - -  03/29/16 1515 (!) 114/95 mmHg 98 F (36.7 C) - (!) 57 15 96 % - -  03/29/16 1500 (!) 104/34 mmHg - - (!) 59 19 99 % - -  03/29/16 1445 113/65 mmHg 97.9 F (  36.6 C) - (!) 58 15 100 % - -  03/29/16 1249 - - - 63 (!) 22 100 % - -  03/29/16 1248 91/63 mmHg - - (!) 55 20 100 % - -  03/29/16 1247 - - - 64 15 100 % - -  03/29/16 1246 - - - 60 19 100 % - -  03/29/16 1245 (!) 95/59 mmHg - - (!) 58 13 100 % - -  03/29/16 1244 - - - 61 12 100 % - -  03/29/16 1243 - - - (!) 59 13 100 % - -  03/29/16 1242 106/63 mmHg - - 60 15 100 % - -  03/29/16 1241 - - - 64 17 100 % - -  03/29/16 1240 - - - (!) 53 (!) 8 100 % - -  03/29/16 1239 114/77 mmHg - - (!) 56 11 100 % - -  03/29/16 1238 - - - (!) 58 - 100 % - -  03/29/16 1152 - - - - - - 5' 2.75" (1.594 m) 46.976 kg (103 lb 9 oz)  03/29/16 1132 120/60 mmHg 98 F (36.7 C) Oral (!) 55 16 100 % - -     Recent laboratory studies: No results for input(s): WBC, HGB, HCT, PLT, NA, K, CL, CO2, BUN, CREATININE, GLUCOSE, INR, CALCIUM in the last 72 hours.  Invalid input(s): PT,  2   Discharge Medications:     Medication List    TAKE these medications        calamine lotion  Apply 1 application topically as needed for itching. TO POISON OAK ON LEFT AND RIGHT ARMS     oxyCODONE-acetaminophen 5-325 MG tablet  Commonly known as:  ROXICET  Take 1-2 tablets by mouth every 4 (four) hours as needed.        Diagnostic Studies: No results found.  Disposition: 01-Home or Self Care      Discharge Instructions    Call MD / Call 911    Complete by:  As directed   If you experience chest pain or shortness of breath, CALL 911 and be transported to the hospital emergency room.  If you develope a fever above 101 F, pus (white drainage) or increased drainage or redness at the wound, or calf pain, call your surgeon's office.     Constipation Prevention    Complete by:  As directed   Drink plenty of fluids.  Prune juice may be helpful.  You may use a stool softener, such as Colace (over the counter) 100 mg twice a day.  Use MiraLax (over the counter) for constipation as needed.     Diet - low sodium heart healthy    Complete by:  As directed      Discharge patient    Complete by:  As directed      Increase activity slowly as tolerated    Complete by:  As directed            Follow-up Information    Follow up with Kathryne Hitch, MD In 2 weeks.   Specialty:  Orthopedic Surgery   Contact information:   [address] [place] [address] [phone]        Signed: Kathryne Hitch 03/30/2016, 9:19 AM

## 2016-03-30 NOTE — Progress Notes (Signed)
Subjective: 1 Day Post-Op Procedure(s) (LRB): LEFT WRIST OSTEOTOMY WITH OPEN REDUCTION INTERNAL FIXATION (ORIF)  (Left) Patient reports pain as mild.    Objective: Vital signs in last 24 hours: Temp:  [97.5 F (36.4 C)-98 F (36.7 C)] 97.6 F (36.4 C) (05/27 0749) Pulse Rate:  [44-76] 53 (05/27 0749) Resp:  [8-22] 16 (05/27 0749) BP: (79-120)/(34-95) 88/57 mmHg (05/27 0749) SpO2:  [96 %-100 %] 96 % (05/27 0456) Weight:  [46.976 kg (103 lb 9 oz)] 46.976 kg (103 lb 9 oz) (05/26 1152)  Intake/Output from previous day: 05/26 0701 - 05/27 0700 In: 3536.3 [P.O.:1380; I.V.:2156.3] Out: 1800 [Urine:1800] Intake/Output this shift:    No results for input(s): HGB in the last 72 hours. No results for input(s): WBC, RBC, HCT, PLT in the last 72 hours. No results for input(s): NA, K, CL, CO2, BUN, CREATININE, GLUCOSE, CALCIUM in the last 72 hours. No results for input(s): LABPT, INR in the last 72 hours.  Intact pulses distally  Fingers well perfused. Splint intact  Assessment/Plan: 1 Day Post-Op Procedure(s) (LRB): LEFT WRIST OSTEOTOMY WITH OPEN REDUCTION INTERNAL FIXATION (ORIF)  (Left) Discharge to home today  Regina Daniel,Regina Daniel 03/30/2016, 9:17 AM

## 2016-03-30 NOTE — Progress Notes (Signed)
Patient BP and pulse has been trending low post-op notified Dr. August Saucerean of patient's current  BP 88/57 and HR 53. Patient denies any dizziness and is currently asymptomatic at this time. No new orders received will continue to monitor and notifiy Dr. Magnus IvanBlackman on his morning rounds.
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
## 2016-04-02 ENCOUNTER — Encounter (HOSPITAL_COMMUNITY): Payer: Self-pay | Admitting: Orthopaedic Surgery

## 2016-06-26 ENCOUNTER — Ambulatory Visit: Payer: No Typology Code available for payment source | Attending: Physician Assistant | Admitting: Physical Therapy

## 2016-06-26 ENCOUNTER — Encounter: Payer: Self-pay | Admitting: Physical Therapy

## 2016-06-26 DIAGNOSIS — M25512 Pain in left shoulder: Secondary | ICD-10-CM | POA: Insufficient documentation

## 2016-06-26 DIAGNOSIS — M25632 Stiffness of left wrist, not elsewhere classified: Secondary | ICD-10-CM | POA: Diagnosis present

## 2016-06-26 DIAGNOSIS — M25532 Pain in left wrist: Secondary | ICD-10-CM | POA: Diagnosis present

## 2016-06-26 DIAGNOSIS — M6281 Muscle weakness (generalized): Secondary | ICD-10-CM | POA: Insufficient documentation

## 2016-06-26 NOTE — Patient Instructions (Signed)
Access Code: [email or web address]  URL: [email or web address]  Date: 06/26/2016  Prepared by: Army FossaJessica Kmya Placide   Exercises  Seated Scapular Retraction  Forearm Pronation and Supination with Hammer - 2x daily - 7x weekly  Wrist Flexion Extension AROM - Palms Down - 2x daily - 7x weekly

## 2016-06-26 NOTE — Therapy (Signed)
[facility] [address] [place], [place], [address] Phone: [phone]   Fax:  [phone]  Physical Therapy Evaluation  Patient Details  Name: Regina Daniel MRN: [id number] Date of Birth: 12/08/1970 Referring Provider: Kirtland BouchardGilbert W Clark PA-C  Encounter Date: 06/26/2016      PT End of Session - 06/26/16 1018    Visit Number 1   Number of Visits 13   Date for PT Re-Evaluation 08/09/16   Authorization Type self pay   PT Start Time 1015   PT Stop Time 1113   PT Time Calculation (min) 58 min   Activity Tolerance Patient limited by pain   Behavior [facility] Therapy [facility] for tasks assessed/performed;Anxious      Past Medical History:  Diagnosis Date  . Drug abuse   . Left wrist fracture JAN 6.2017  . 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 CHOLECYSTECTOMY    . NASAL SEPTUM SURGERY  MARCH 2017  . ORIF WRIST FRACTURE Left 03/29/2016   Procedure: LEFT WRIST OSTEOTOMY WITH OPEN REDUCTION INTERNAL FIXATION (ORIF) ;  Surgeon: Kathryne Hitchhristopher Y Blackman, MD;  Location: [facility];  Service: Orthopedics;  Laterality: Left;  Left Intra-clavicular block  . TUBAL LIGATION    . WRIST SURGERY Right YEARS AGO   TENDON REPAIR    There were no vitals filed for this visit.       Subjective Assessment - 06/26/16 1021    Subjective Constant ache in wirst. Denies N/T. Recently had injection which helped but still has pain with raising arm.    Patient Stated Goals back to work (used to work as Advertising copywriterhousekeeper), care for dog, make bed   Currently in Pain? Yes   Pain Score 6    Pain Location Wrist   Pain Orientation Left  palmar aspect   Pain Descriptors / Indicators Throbbing   Pain Type Surgical pain   Pain Frequency Constant   Aggravating Factors   movement, grabbing, lifting   Pain Relieving Factors rest, medications (somewhat)   Multiple Pain Sites Yes   Pain Score 9   Pain Location Shoulder   Pain Orientation Left   Pain Descriptors / Indicators Burning;Cramping   Pain Type Acute pain   Aggravating Factors  moving arm   Pain Relieving Factors rest            [facility] PT Assessment - 06/26/16 0001      Assessment   Medical Diagnosis L GHJ impingement, s/p L wrist osteotomy   Referring Provider Kirtland BouchardGilbert W Clark PA-C   Onset Date/Surgical Date 03/29/16   Hand Dominance Right   Next MD Visit 9/18   Prior Therapy no     Precautions   Precaution Comments pt reports MD requested limited use of wrist     Restrictions   Weight Bearing Restrictions No     Balance Screen   Has the patient fallen in the past 6 months Yes   How many times? 2  since surgery     Home Environment   Living Environment Private residence     Prior Function   Level of Independence Independent     Cognition   Overall Cognitive Status Within Functional Limits for tasks assessed     Observation/Other Assessments  Focus on Therapeutic Outcomes (FOTO)  45% ability, goal 65%     Posture/Postural Control   Posture Comments R cervical sidebend, L GHJ IR and scapular protraction     ROM / Strength   AROM / PROM / Strength AROM;PROM;Strength     AROM   AROM Assessment Site Shoulder;Wrist   Right/Left Shoulder Left   Left Shoulder Flexion 122 Degrees   Left Shoulder ABduction 97 Degrees   Right/Left Wrist Left   Left Wrist Extension 10 Degrees   Left Wrist Flexion 42 Degrees   Left Wrist Radial Deviation 18 Degrees   Left Wrist Ulnar Deviation 10 Degrees     PROM   Overall PROM Comments able to apply overpressure at end range AROM, no measurements due to pain   PROM Assessment Site Shoulder   Right/Left Shoulder Left     Strength   Strength Assessment Site Shoulder;Hand   Right/Left Shoulder Left   Left Shoulder Flexion 3-/5  unable  to move through full available ROM   Left Shoulder Extension 4/5   Left Shoulder ABduction 3-/5   Left Shoulder Internal Rotation 4+/5   Left Shoulder External Rotation 4+/5   Right/Left hand Left   Left Hand Grip (lbs) 15  R 50lb     Palpation   Palpation comment pectoralis major on L concordant pain upon palpation                   [facility] Adult PT Treatment/Exercise - 06/26/16 0001      Exercises   Exercises Wrist;Shoulder     Shoulder Exercises: Seated   Retraction 20 reps     Wrist Exercises   Forearm Supination Limitations alt pronation/supination, gel bottle in hand   Wrist Flexion Limitations seated alt flx/ext     Modalities   Modalities Cryotherapy;Electrical Stimulation     Cryotherapy   Number Minutes Cryotherapy 15 Minutes  concurrent with ESTIM   Cryotherapy Location Shoulder  L   Type of Cryotherapy Ice pack     Electrical Stimulation   Electrical Stimulation Location L GHJ   Electrical Stimulation Action IFC   Electrical Stimulation Goals Pain                PT Education - 06/26/16 1109    Education provided Yes   Education Details exercise form/rationale, anatomy of condition, POC, HEP   Person(s) Educated Patient   Methods Explanation;Demonstration;Tactile cues;Verbal cues;Handout   Comprehension Verbalized understanding;Returned demonstration;Verbal cues required;Tactile cues required;Need further instruction          PT Short Term Goals - 06/26/16 1122      PT SHORT TERM GOAL #1   Title Pt will verbalize average shoulder pain <7/10 by 9/15   Time 3   Period Weeks   Status New     PT SHORT TERM GOAL #2   Title Pt will be able to lift 5lb weight with L wrist pain <5/10   Time 3   Period Weeks   Status New           PT Long Term Goals - 06/26/16 1124      PT LONG TERM GOAL #1   Title Grip strength within 10 lb R to L to improve functional use of hand by 10/6   Baseline see flowsheet   Time 6   Period Weeks    Status New     PT LONG TERM GOAL #2   Title Pt will demonstrate GHJ ROM flx and abd  to at least 150 to improve functional reach   Baseline see flowsheet   Time 6   Period Weeks   Status New     PT LONG TERM GOAL #3   Title Pt will be able to perform all self care activities for she and her dog with average pain <3/10 to decrease pain effects on function   Baseline severe pain at eval   Time 6   Period Weeks   Status New     PT LONG TERM GOAL #4   Title Pt will be able to carry 10lb purse/grocery bag in L hand pain <3/10   Baseline unable at eval.    Time 6   Period Weeks   Status New     PT LONG TERM GOAL #5   Title Pt will verbalize improving confidence and ability toward returning to work utilizing L UE   Baseline unable at eval   Time 6   Period Weeks   Status New               Plan - 06/26/16 1112    Clinical Impression Statement Pt presents to PT with complaints of L wrist and shoulder pain following wrist osteotomy in May, 2017. Pt presents with limited ROM in both shoulder and wrist as well as limited strength. Notable bruising at L shoulder where injection was placed. Pt was tender along pectoralis major and was educated on trigger points and shortening causing pain v heart problems. Pt will benefit from skilled PT in order to decrease wrist and shoulder pain as well as reach LTGs to return to PLOF.    Rehab Potential Fair   PT Frequency 2x / week   PT Duration 6 weeks   PT Treatment/Interventions ADLs/Self Care Home Management;Cryotherapy;Electrical Stimulation;Iontophoresis 4mg /ml Dexamethasone;Functional mobility training;Ultrasound;Moist Heat;Therapeutic activities;Therapeutic exercise;Neuromuscular re-education;Patient/family education;Passive range of motion;Scar mobilization;Manual techniques;Dry needling;Taping   PT Next Visit Plan pulleys, measure shoulder PROM if tolerated, postural endurance   PT Home Exercise Plan scapular retraction, wrist  flx/ext/pronation/supination, tennis ball for pectoralis trigger points   Consulted and Agree with Plan of Care Patient      Patient will benefit from skilled therapeutic intervention in order to improve the following deficits and impairments:  Postural dysfunction, Decreased strength, Decreased range of motion, Increased muscle spasms, Impaired UE functional use, Pain, Decreased activity tolerance, Decreased scar mobility, Improper body mechanics  Visit Diagnosis: Pain in left shoulder - Plan: PT plan of care cert/re-cert  Pain in left wrist - Plan: PT plan of care cert/re-cert  Muscle weakness (generalized) - Plan: PT plan of care cert/re-cert  Stiffness of left wrist, not elsewhere classified - Plan: PT plan of care cert/re-cert     Problem List Patient Active Problem List   Diagnosis Date Noted  . 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 Distal radius fracture 03/29/2016    Donie Moulton C. Sopheap Basic PT, DPT 06/26/16 11:35 AM   [facility] [address] [place], [place], [address] Phone: [phone]   Fax:  [phone]  Name: Regina Daniel MRN: [id number] Date of Birth: [birth year]

## 2016-07-09 ENCOUNTER — Ambulatory Visit: Payer: No Typology Code available for payment source | Attending: Physician Assistant | Admitting: Physical Therapy

## 2016-07-09 DIAGNOSIS — M25532 Pain in left wrist: Secondary | ICD-10-CM | POA: Diagnosis present

## 2016-07-09 DIAGNOSIS — M6281 Muscle weakness (generalized): Secondary | ICD-10-CM | POA: Diagnosis present

## 2016-07-09 DIAGNOSIS — M25632 Stiffness of left wrist, not elsewhere classified: Secondary | ICD-10-CM | POA: Diagnosis present

## 2016-07-09 DIAGNOSIS — M25512 Pain in left shoulder: Secondary | ICD-10-CM | POA: Diagnosis not present

## 2016-07-09 NOTE — Therapy (Signed)
[facility] [address] [place], [place], [address] Phone: [phone]   Fax:  [phone]  Physical Therapy Treatment  Patient Details  Name: Regina Daniel MRN: [id number] Date of Birth: Nov 17, 1970 Referring Provider: Kirtland Bouchard PA-C  Encounter Date: 07/09/2016      PT End of Session - 07/09/16 1414    Visit Number 2   Number of Visits 13   Date for PT Re-Evaluation 08/09/16   PT Start Time 1415   PT Stop Time 1505   PT Time Calculation (min) 50 min   Activity Tolerance Patient tolerated treatment well   Behavior During Therapy [facility] for tasks assessed/performed      Past Medical History:  Diagnosis Date  . Drug abuse   . Left wrist fracture JAN 6.2017  . 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 CHOLECYSTECTOMY    . NASAL SEPTUM SURGERY  MARCH 2017  . ORIF WRIST FRACTURE Left 03/29/2016   Procedure: LEFT WRIST OSTEOTOMY WITH OPEN REDUCTION INTERNAL FIXATION (ORIF) ;  Surgeon: Kathryne Hitch, MD;  Location: [facility];  Service: Orthopedics;  Laterality: Left;  Left Intra-clavicular block  . TUBAL LIGATION    . WRIST SURGERY Right YEARS AGO   TENDON REPAIR    There were no vitals filed for this visit.      Subjective Assessment - 07/09/16 1416    Subjective Pt reports she has been working with her arm and it is feeling better.    Currently in Pain? Yes   Pain Score 2    Pain Location Wrist   Pain Orientation Left   Pain Descriptors / Indicators Sore   Pain Score 4   Pain Location Shoulder   Pain Orientation Left   Pain Descriptors / Indicators Sore            [facility] PT Assessment - 07/09/16 0001      AROM   Left Shoulder Flexion 152 Degrees   Left Shoulder ABduction 153 Degrees                      [facility] Adult PT Treatment/Exercise - 07/09/16 0001      Exercises   Exercises Hand     Elbow Exercises   Wrist Flexion Limitations supine flexion/ext 1# x20     Shoulder Exercises: Supine   Other Supine Exercises ABCs 1#     Shoulder Exercises: Standing   Extension Weight (lbs) 1   Extension Limitations triceps kicks x20   Row 20 reps   Theraband Level (Shoulder Row) Level 1 (Yellow)   Other Standing Exercises small circles red plyo ball overhead on wall     Shoulder Exercises: Pulleys   Flexion 2 minutes     Hand Exercises   Theraputty - Grip 3'     Wrist Exercises   Other wrist exercises velcroe small strip- large handle, large key turn; therabar pronation/supination; therabar smiley/frownies   Other wrist exercises flexion/extension stretch on table     Cryotherapy   Number Minutes Cryotherapy 10 Minutes  concurrent with ESTIM   Cryotherapy Location Wrist  L.   Type of Cryotherapy Ice pack     Electrical Stimulation   Electrical Stimulation Location L wrist  Electrical Stimulation Action [employer]   Electrical Stimulation Goals Pain                PT Education - 07/09/16 1413    Education provided Yes   Education Details exercise form/rationale   Person(s) Educated Patient   Methods Explanation;Demonstration;Tactile cues;Verbal cues   Comprehension Verbalized understanding;Returned demonstration;Verbal cues required;Tactile cues required;Need further instruction          PT Short Term Goals - 06/26/16 1122      PT SHORT TERM GOAL #1   Title Pt will verbalize average shoulder pain <7/10 by 9/15   Time 3   Period Weeks   Status New     PT SHORT TERM GOAL #2   Title Pt will be able to lift 5lb weight with L wrist pain <5/10   Time 3   Period Weeks   Status New           PT Long Term Goals - 06/26/16 1124      PT LONG TERM GOAL #1   Title Grip strength within 10 lb R to L to improve functional use of hand by  10/6   Baseline see flowsheet   Time 6   Period Weeks   Status New     PT LONG TERM GOAL #2   Title Pt will demonstrate GHJ ROM flx and abd to at least 150 to improve functional reach   Baseline see flowsheet   Time 6   Period Weeks   Status New     PT LONG TERM GOAL #3   Title Pt will be able to perform all self care activities for she and her dog with average pain <3/10 to decrease pain effects on function   Baseline severe pain at eval   Time 6   Period Weeks   Status New     PT LONG TERM GOAL #4   Title Pt will be able to carry 10lb purse/grocery bag in L hand pain <3/10   Baseline unable at eval.    Time 6   Period Weeks   Status New     PT LONG TERM GOAL #5   Title Pt will verbalize improving confidence and ability toward returning to work utilizing L UE   Baseline unable at eval   Time 6   Period Weeks   Status New               Plan - 07/09/16 1459    Clinical Impression Statement Significant improvement in pain reports as well as functional movement of GHJ. Limited strength and ROM in wrist/hand continue to inhibit functional daily activities and will benefit from further skilled therapy.    PT Next Visit Plan wrist/hand ROM and strength/endurance   Consulted and Agree with Plan of Care Patient      Patient will benefit from skilled therapeutic intervention in order to improve the following deficits and impairments:     Visit Diagnosis: Pain in left shoulder  Pain in left wrist  Muscle weakness (generalized)  Stiffness of left wrist, not elsewhere classified     Problem List Patient Active Problem List   Diagnosis Date Noted  . 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 Distal radius fracture 03/29/2016    Ilisha Blust C. Martise Waddell PT, DPT 07/09/16 3:01 PM   [facility] [address] [facility] [address] [place], [place], [address] Phone: [phone]   Fax:   [phone]  Name: MICHELENA CULMER MRN: [id number] Date of Birth: [birth year]

## 2016-07-11 ENCOUNTER — Ambulatory Visit: Payer: No Typology Code available for payment source | Admitting: Physical Therapy

## 2016-07-11 DIAGNOSIS — M25532 Pain in left wrist: Secondary | ICD-10-CM

## 2016-07-11 DIAGNOSIS — M25512 Pain in left shoulder: Secondary | ICD-10-CM

## 2016-07-11 DIAGNOSIS — M25632 Stiffness of left wrist, not elsewhere classified: Secondary | ICD-10-CM

## 2016-07-11 NOTE — Therapy (Signed)
[facility] [address] [place], [place], [address] Phone: [phone]   Fax:  [phone]  Physical Therapy Treatment  Patient Details  Name: Regina Daniel MRN: [id number] Date of Birth: [birth year] Referring Provider: Kirtland Bouchard PA-C  Encounter Date: 07/11/2016      PT End of Session - 07/11/16 1510    Visit Number 3   Number of Visits 13   PT Start Time 1420  short session .  Patient going to beach   PT Stop Time 1452   PT Time Calculation (min) 32 min   Activity Tolerance Patient tolerated treatment well   Behavior During Therapy [facility] for tasks assessed/performed      Past Medical History:  Diagnosis Date  . Drug abuse   . Left wrist fracture JAN 6.2017  . 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 CHOLECYSTECTOMY    . NASAL SEPTUM SURGERY  MARCH 2017  . ORIF WRIST FRACTURE Left 03/29/2016   Procedure: LEFT WRIST OSTEOTOMY WITH OPEN REDUCTION INTERNAL FIXATION (ORIF) ;  Surgeon: Kathryne Hitch, MD;  Location: [facility];  Service: Orthopedics;  Laterality: Left;  Left Intra-clavicular block  . TUBAL LIGATION    . WRIST SURGERY Right YEARS AGO   TENDON REPAIR    There were no vitals filed for this visit.      Subjective Assessment - 07/11/16 1424    Subjective not much pain.  I got playdough  and            [facility] Assessment - 07/11/16 0001      AROM   Left Wrist Extension 15 Degrees   Left Wrist Flexion 40 Degrees     Strength   Left Shoulder Flexion 4/5   Left Shoulder ABduction 4/5   Left Shoulder Internal Rotation 4+/5   Left Shoulder External Rotation 4+/5   Left Hand Grip (lbs) 23.66  27, 24, 20                     [facility] Adult PT Treatment/Exercise - 07/11/16 0001      Self-Care   Self-Care --  encouraged using hand more off and on.  Anatomy wrist,how      Elbow Exercises   Forearm Supination 10 reps   Forearm Pronation Limitations 10 reps     Hand Exercises   Opposition --  1 X   Theraputty - Pinch single to thumb   Thumb Opposition 1X     Wrist Exercises   Wrist Radial Deviation 10 reps   Wrist Ulnar Deviation 10 reps   Other wrist exercises wrist flexion/extension10 X each   Other wrist exercises stretching into extension1.  hands togetheroverhead height,  keeping hends together lowering hands and pulling elbows apart.  hand on flat surface, stabilized by the other hand,  passive stretch into extension by lifting elbow.     Manual Therapy   Manual therapy comments a/p glides ulna in supination grade 2 painfree,  soft tissue work to tendon medial wrist to decrease "Pinching"  with redial deviation                  PT Short Term Goals - 07/11/16 1513      PT SHORT  TERM GOAL #1   Title Pt will verbalize average shoulder pain <7/10 by 9/15   Baseline Hardly any pain at all   Time 3   Period Weeks   Status Achieved     PT SHORT TERM GOAL #2   Title Pt will be able to lift 5lb weight with L wrist pain <5/10   Time 3   Period Weeks   Status Unable to assess           PT Long Term Goals - 06/26/16 1124      PT LONG TERM GOAL #1   Title Grip strength within 10 lb R to L to improve functional use of hand by 10/6   Baseline see flowsheet   Time 6   Period Weeks   Status New     PT LONG TERM GOAL #2   Title Pt will demonstrate GHJ ROM flx and abd to at least 150 to improve functional reach   Baseline see flowsheet   Time 6   Period Weeks   Status New     PT LONG TERM GOAL #3   Title Pt will be able to perform all self care activities for she and her dog with average pain <3/10 to decrease pain effects on function   Baseline severe pain at eval   Time 6   Period Weeks   Status New     PT LONG TERM GOAL #4   Title  Pt will be able to carry 10lb purse/grocery bag in L hand pain <3/10   Baseline unable at eval.    Time 6   Period Weeks   Status New     PT LONG TERM GOAL #5   Title Pt will verbalize improving confidence and ability toward returning to work utilizing L UE   Baseline unable at eval   Time 6   Period Weeks   Status New               Plan - 07/11/16 1512    Clinical Impression Statement Grip in left hand increased to 23.66 pounds.  strength in shoulder also improved.   PT Next Visit Plan wrist/hand ROM and strength/endurance  try UBE? see if she can start with light barbells.    PT Home Exercise Plan continue,  use hand more off and on all day   Consulted and Agree with Plan of Care Patient      Patient will benefit from skilled therapeutic intervention in order to improve the following deficits and impairments:  Postural dysfunction, Decreased strength, Decreased range of motion, Increased muscle spasms, Impaired UE functional use, Pain, Decreased activity tolerance, Decreased scar mobility, Improper body mechanics  Visit Diagnosis: Pain in left shoulder  Pain in left wrist  Stiffness of left wrist, not elsewhere classified     Problem List Patient Active Problem List   Diagnosis Date Noted  . 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 Distal radius fracture 03/29/2016    [facility] CarmelARRIS,KAREN 07/11/2016, 3:16 PM  [facility] [address] [place], [place], [address] Phone: [phone]   Fax:  [phone]  Name: Regina Daniel MRN: [id number] Date of Birth: 1971/01/10   Liz BeachKaren Harris, PTA 07/11/16 3:17 PM Phone: [phone] Fax: [phone]

## 2016-07-16 ENCOUNTER — Ambulatory Visit: Payer: No Typology Code available for payment source | Admitting: Physical Therapy

## 2016-07-16 DIAGNOSIS — M25512 Pain in left shoulder: Secondary | ICD-10-CM

## 2016-07-16 DIAGNOSIS — M25632 Stiffness of left wrist, not elsewhere classified: Secondary | ICD-10-CM

## 2016-07-16 DIAGNOSIS — M6281 Muscle weakness (generalized): Secondary | ICD-10-CM

## 2016-07-16 DIAGNOSIS — M25532 Pain in left wrist: Secondary | ICD-10-CM

## 2016-07-16 NOTE — Therapy (Signed)
[facility] [address] [place], [place], [address] Phone: [phone]   Fax:  [phone]  Physical Therapy Treatment  Patient Details  Name: Regina Daniel MRN: [id number] Date of Birth: [birth year] Referring Provider: Kirtland Bouchard PA-C  Encounter Date: 07/16/2016      PT End of Session - 07/16/16 1456    Visit Number 4   Number of Visits 13   Date for PT Re-Evaluation 08/09/16   PT Start Time 1417   PT Stop Time 1459   PT Time Calculation (min) 42 min   Activity Tolerance Patient tolerated treatment well   Behavior During Therapy [facility] for tasks assessed/performed      Past Medical History:  Diagnosis Date  . Drug abuse   . Left wrist fracture JAN 6.2017  . 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 CHOLECYSTECTOMY    . NASAL SEPTUM SURGERY  MARCH 2017  . ORIF WRIST FRACTURE Left 03/29/2016   Procedure: LEFT WRIST OSTEOTOMY WITH OPEN REDUCTION INTERNAL FIXATION (ORIF) ;  Surgeon: Kathryne Hitch, MD;  Location: [facility];  Service: Orthopedics;  Laterality: Left;  Left Intra-clavicular block  . TUBAL LIGATION    . WRIST SURGERY Right YEARS AGO   TENDON REPAIR    There were no vitals filed for this visit.      Subjective Assessment - 07/16/16 1419    Subjective Mild pain.  Weather related.   Currently in Pain? Yes   Pain Score --  mild   Pain Location Wrist   Pain Orientation Left   Pain Frequency Intermittent   Aggravating Factors  weather, rain   Pain Relieving Factors nothing   Pain Score --  mild to moderate   Pain Location Shoulder   Pain Orientation Left   Pain Descriptors / Indicators Sore;Aching   Pain Radiating Towards whole arm   Aggravating Factors  weather   Pain Relieving Factors nothing            [facility] PT Assessment - 07/16/16 0001      Strength   Left Hand Grip (lbs) 31  35,28, 30  LBS.                     [facility] Adult PT Treatment/Exercise - 07/16/16 0001      Elbow Exercises   Forearm Supination 10 reps  1 pound barbell   Forearm Pronation Limitations 10,  1 pound barbell,  holding the end     Shoulder Exercises: Seated   Retraction 20 reps     Shoulder Exercises: Pulleys   Flexion 3 minutes     Shoulder Exercises: ROM/Strengthening   UBE (Upper Arm Bike) 2 minutes each direction, level 1,  hardest  reverse     Hand Exercises   Other Hand Exercises pinch, 5 X to thumb tip each finger   Other Hand Exercises velcro roll,  pinch, key,  rollong hammer handle.  1-2 X each,       Manual Therapy   Manual therapy comments 2 fans of kinesiotex tape for wrist to decrease edema,  retro rgade for sot tissue work                  PT Short Term  Goals - 07/16/16 1459      PT SHORT TERM GOAL #1   Title Pt will verbalize average shoulder pain <7/10 by 9/15   Time 3   Period Weeks     PT SHORT TERM GOAL #2   Title Pt will be able to lift 5lb weight with L wrist pain <5/10   Baseline uses 1 pound in clinic   Time 3   Period Weeks   Status On-going           PT Long Term Goals - 06/26/16 1124      PT LONG TERM GOAL #1   Title Grip strength within 10 lb R to L to improve functional use of hand by 10/6   Baseline see flowsheet   Time 6   Period Weeks   Status New     PT LONG TERM GOAL #2   Title Pt will demonstrate GHJ ROM flx and abd to at least 150 to improve functional reach   Baseline see flowsheet   Time 6   Period Weeks   Status New     PT LONG TERM GOAL #3   Title Pt will be able to perform all self care activities for she and her dog with average pain <3/10 to decrease pain effects on function   Baseline severe pain at eval   Time 6   Period Weeks   Status New     PT LONG TERM GOAL #4   Title Pt will be able  to carry 10lb purse/grocery bag in L hand pain <3/10   Baseline unable at eval.    Time 6   Period Weeks   Status New     PT LONG TERM GOAL #5   Title Pt will verbalize improving confidence and ability toward returning to work utilizing L UE   Baseline unable at eval   Time 6   Period Weeks   Status New               Plan - 07/16/16 1457    Clinical Impression Statement Grip 31 pounds. More sore with exercises due to the weather per patient (Makes her swell).  Progress toward grip strength goal.   PT Next Visit Plan Assess tape ,  strength/endurance shoulder/arm/hand   PT Home Exercise Plan continue   Consulted and Agree with Plan of Care Patient      Patient will benefit from skilled therapeutic intervention in order to improve the following deficits and impairments:     Visit Diagnosis: Pain in left shoulder  Pain in left wrist  Stiffness of left wrist, not elsewhere classified  Muscle weakness (generalized)     Problem List Patient Active Problem List   Diagnosis Date Noted  . 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 Distal radius fracture 03/29/2016    [facility] PlaceARRIS,Jabarri Stefanelli 07/16/2016, 3:01 PM  [facility] [address] [place], [place], [address] Phone: [phone]   Fax:  [phone]  Name: Regina Daniel MRN: [id number] Date of Birth: 14-Jun-1971   Liz BeachKaren Hannibal Skalla, PTA 07/16/16 3:01 PM Phone: [phone] Fax: [phone]

## 2016-07-18 ENCOUNTER — Ambulatory Visit: Payer: No Typology Code available for payment source | Admitting: Physical Therapy

## 2016-07-18 DIAGNOSIS — M25512 Pain in left shoulder: Secondary | ICD-10-CM | POA: Diagnosis not present

## 2016-07-18 DIAGNOSIS — M25632 Stiffness of left wrist, not elsewhere classified: Secondary | ICD-10-CM

## 2016-07-18 DIAGNOSIS — M25532 Pain in left wrist: Secondary | ICD-10-CM

## 2016-07-18 DIAGNOSIS — M6281 Muscle weakness (generalized): Secondary | ICD-10-CM

## 2016-07-18 NOTE — Therapy (Signed)
[facility] [address] [place], [place], [address] Phone: [phone]   Fax:  [phone]  Physical Therapy Treatment  Patient Details  Name: Regina Daniel MRN: [id number] Date of Birth: Apr 06, 1971 Referring Provider: Kirtland Bouchard PA-C  Encounter Date: 07/18/2016      PT End of Session - 07/18/16 1439    Visit Number 5   Number of Visits 13   Date for PT Re-Evaluation 08/09/16   Authorization Type self pay   PT Start Time 0217   PT Stop Time 0300   PT Time Calculation (min) 43 min   Activity Tolerance Patient tolerated treatment well   Behavior During Therapy [facility] for tasks assessed/performed      Past Medical History:  Diagnosis Date  . Drug abuse   . Left wrist fracture JAN 6.2017  . 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 CHOLECYSTECTOMY    . NASAL SEPTUM SURGERY  MARCH 2017  . ORIF WRIST FRACTURE Left 03/29/2016   Procedure: LEFT WRIST OSTEOTOMY WITH OPEN REDUCTION INTERNAL FIXATION (ORIF) ;  Surgeon: Kathryne Hitch, MD;  Location: [facility];  Service: Orthopedics;  Laterality: Left;  Left Intra-clavicular block  . TUBAL LIGATION    . WRIST SURGERY Right YEARS AGO   TENDON REPAIR    There were no vitals filed for this visit.      Subjective Assessment - 07/18/16 1425    Subjective Pain has improved since the rain went away. She is only having minor pain today.    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 Yes   Pain Score 2    Pain Location Wrist   Pain Orientation Left   Pain Descriptors / Indicators Sore   Pain Type Surgical pain   Pain Frequency Intermittent   Aggravating Factors  weather and rain    Pain Relieving Factors nothing     Multiple Pain Sites No                         [facility] Adult PT Treatment/Exercise - 07/18/16 0001      Shoulder Exercises: Seated   Retraction 20 reps     Shoulder Exercises: Pulleys   Flexion 3 minutes     Shoulder Exercises: ROM/Strengthening   UBE (Upper Arm Bike) 2 minutes each direction, level 1,  hardest  reverse     Hand Exercises   Other Hand Exercises pinch, 5 X to thumb tip each finger   Other Hand Exercises velcro roll,  pinch, key,  rollong hammer handle.  1-2 X each,       Wrist Exercises   Other wrist exercises wrist flexion/extension2x10  each     Manual Therapy   Manual therapy comments a/p glides ulna in supination grade 2 painfree,  soft tissue work to tendon medial wrist to decrease "Pinching"  with redial deviation                PT Education - 07/18/16 1439    Education provided Yes   Education Details exercise rational / rationale    Person(s) Educated Patient   Methods Explanation;Demonstration;Tactile cues;Verbal cues  Comprehension Verbalized understanding;Returned demonstration;Verbal cues required;Tactile cues required;Need further instruction          PT Short Term Goals - 07/18/16 1738      PT SHORT TERM GOAL #1   Title Pt will verbalize average shoulder pain <7/10 by 9/15   Baseline Hardly any pain at all   Time 3   Period Weeks   Status Achieved     PT SHORT TERM GOAL #2   Title Pt will be able to lift 5lb weight with L wrist pain <5/10   Baseline uses 1 pound in clinic   Time 3   Period Weeks   Status On-going           PT Long Term Goals - 06/26/16 1124      PT LONG TERM GOAL #1   Title Grip strength within 10 lb R to L to improve functional use of hand by 10/6   Baseline see flowsheet   Time 6   Period Weeks   Status New     PT LONG TERM GOAL #2   Title Pt will demonstrate GHJ ROM flx and abd to at least 150 to improve functional reach   Baseline see flowsheet   Time 6   Period Weeks    Status New     PT LONG TERM GOAL #3   Title Pt will be able to perform all self care activities for she and her dog with average pain <3/10 to decrease pain effects on function   Baseline severe pain at eval   Time 6   Period Weeks   Status New     PT LONG TERM GOAL #4   Title Pt will be able to carry 10lb purse/grocery bag in L hand pain <3/10   Baseline unable at eval.    Time 6   Period Weeks   Status New     PT LONG TERM GOAL #5   Title Pt will verbalize improving confidence and ability toward returning to work utilizing L UE   Baseline unable at eval   Time 6   Period Weeks   Status New               Plan - 07/18/16 1737    Clinical Impression Statement Patient tolerated treatment well. Therapy reviewed edema taping with her and gave her cut piece of tape. She had no increase in pain after the treatment.    Rehab Potential Fair   PT Frequency 2x / week   PT Duration 6 weeks   PT Treatment/Interventions ADLs/Self Care Home Management;Cryotherapy;Electrical Stimulation;Iontophoresis 4mg /ml Dexamethasone;Functional mobility training;Ultrasound;Moist Heat;Therapeutic activities;Therapeutic exercise;Neuromuscular re-education;Patient/family education;Passive range of motion;Scar mobilization;Manual techniques;Dry needling;Taping   PT Next Visit Plan Assess tape ,  strength/endurance shoulder/arm/hand   PT Home Exercise Plan continue   Consulted and Agree with Plan of Care Patient      Patient will benefit from skilled therapeutic intervention in order to improve the following deficits and impairments:  Postural dysfunction, Decreased strength, Decreased range of motion, Increased muscle spasms, Impaired UE functional use, Pain, Decreased activity tolerance, Decreased scar mobility, Improper body mechanics  Visit Diagnosis: Pain in left shoulder  Pain in left wrist  Stiffness of left wrist, not elsewhere classified  Muscle weakness (generalized)     Problem  List Patient Active Problem List   Diagnosis Date Noted  . 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 Distal radius fracture 03/29/2016  Dessie Comaavid J Tanyiah Laurich  PT DPT  07/18/2016, 5:40 PM  [facility] [address] [place], [place], [address] Phone: [phone]   Fax:  [phone]  Name: Regina Daniel MRN: [id number] Date of Birth: February 26, 1971

## 2016-07-23 ENCOUNTER — Ambulatory Visit: Payer: No Typology Code available for payment source | Admitting: Physical Therapy

## 2016-07-25 ENCOUNTER — Ambulatory Visit: Payer: No Typology Code available for payment source | Admitting: Physical Therapy

## 2016-07-25 DIAGNOSIS — M25632 Stiffness of left wrist, not elsewhere classified: Secondary | ICD-10-CM

## 2016-07-25 DIAGNOSIS — M6281 Muscle weakness (generalized): Secondary | ICD-10-CM

## 2016-07-25 DIAGNOSIS — M25512 Pain in left shoulder: Secondary | ICD-10-CM | POA: Diagnosis not present

## 2016-07-25 DIAGNOSIS — M25532 Pain in left wrist: Secondary | ICD-10-CM

## 2016-07-25 NOTE — Therapy (Signed)
[facility] [address] [place], [place], [address] Phone: [phone]   Fax:  [phone]  Physical Therapy Treatment  Patient Details  Name: Regina Daniel MRN: [id number] Date of Birth: [birth year] Referring Provider: Kirtland Bouchard PA-C  Encounter Date: 07/25/2016      PT End of Session - 07/25/16 1809    Visit Number 6   Number of Visits 13   Date for PT Re-Evaluation 08/09/16   PT Start Time 1418   PT Stop Time 1500   PT Time Calculation (min) 42 min   Activity Tolerance Patient tolerated treatment well   Behavior During Therapy [facility] for tasks assessed/performed      Past Medical History:  Diagnosis Date  . Drug abuse   . Left wrist fracture JAN 6.2017  . 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 CHOLECYSTECTOMY    . NASAL SEPTUM SURGERY  MARCH 2017  . ORIF WRIST FRACTURE Left 03/29/2016   Procedure: LEFT WRIST OSTEOTOMY WITH OPEN REDUCTION INTERNAL FIXATION (ORIF) ;  Surgeon: Kathryne Hitch, MD;  Location: [facility];  Service: Orthopedics;  Laterality: Left;  Left Intra-clavicular block  . TUBAL LIGATION    . WRIST SURGERY Right YEARS AGO   TENDON REPAIR    There were no vitals filed for this visit.      Subjective Assessment - 07/25/16 1423    Subjective It seems like I am picking up a bag of groceries easier. Minor bearable pain.    Currently in Pain? Yes   Pain Score --  minor   Pain Location Wrist   Pain Orientation Left   Pain Descriptors / Indicators Sore   Aggravating Factors  weather and rain   Pain Relieving Factors nothing   Pain Location Shoulder   Pain Orientation Left   Pain Descriptors / Indicators Aching   Pain Frequency Intermittent   Aggravating Factors  I don't know   Pain  Relieving Factors Icy hot patch            [facility] PT Assessment - 07/25/16 0001      AROM   Left Wrist Extension 15 Degrees  PROM after manual  30+ extension     Strength   Left Hand Grip (lbs) 30  31,30.29                     [facility] Adult PT Treatment/Exercise - 07/25/16 0001      Shoulder Exercises: Supine   Horizontal ABduction 10 reps   Theraband Level (Shoulder Horizontal ABduction) Level 1 (Yellow)   External Rotation 10 reps   Theraband Level (Shoulder External Rotation) Level 1 (Yellow)  HEP   Flexion 10 reps   Theraband Level (Shoulder Flexion) Level 1 (Yellow)  Narrow grip, HEP   Other Supine Exercises Diagonal bands 10 X each direction , yeoolw band,  cues initially,  HEP     Shoulder Exercises: ROM/Strengthening   UBE (Upper Arm Bike) 2 minutes each direction, level 1,  hardest  reverse     Manual Therapy   Manual therapy comments A/P glided radial/ulna,  scar tissue mobilization to increase wrist extension                PT Education -  07/25/16 1808    Education provided Yes   Education Details band scapolar, supine.  How to stretch scar tissue   Person(s) Educated Patient   Methods Explanation;Demonstration;Tactile cues;Verbal cues;Handout   Comprehension Verbalized understanding;Returned demonstration          PT Short Term Goals - 07/18/16 1738      PT SHORT TERM GOAL #1   Title Pt will verbalize average shoulder pain <7/10 by 9/15   Baseline Hardly any pain at all   Time 3   Period Weeks   Status Achieved     PT SHORT TERM GOAL #2   Title Pt will be able to lift 5lb weight with L wrist pain <5/10   Baseline uses 1 pound in clinic   Time 3   Period Weeks   Status On-going           PT Long Term Goals - 07/25/16 1816      PT LONG TERM GOAL #1   Title Grip strength within 10 lb R to L to improve functional use of hand by 10/6   Baseline 30 pounds   Time 6   Period Weeks   Status On-going     PT LONG TERM  GOAL #2   Title Pt will demonstrate GHJ ROM flx and abd to at least 150 to improve functional reach   Time 6   Period Weeks   Status Unable to assess     PT LONG TERM GOAL #3   Title Pt will be able to perform all self care activities for she and her dog with average pain <3/10 to decrease pain effects on function   Baseline less pain with dog care.   Time 6   Period Weeks   Status On-going     PT LONG TERM GOAL #4   Title Pt will be able to carry 10lb purse/grocery bag in L hand pain <3/10   Baseline uses 2 arms to carry groceries   Time 6   Period Weeks   Status On-going     PT LONG TERM GOAL #5   Time 6   Period Weeks   Status Unable to assess               Plan - 07/25/16 1813    Clinical Impression Statement Tape was helpful for edema with no edema today.  Wrist extension limited in part by scar tissue,  Range improved Passively to 30 + degrees post manual.  Patient reluctant to work on at home,  It feels Wierd!.  She is willing however to work on strengthebing.    PT Next Visit Plan review bands, continue scar tissue work, work toward goals.   PT Home Exercise Plan Supine scapular stabilization series with yellow band   Consulted and Agree with Plan of Care Patient      Patient will benefit from skilled therapeutic intervention in order to improve the following deficits and impairments:  Postural dysfunction, Decreased strength, Decreased range of motion, Increased muscle spasms, Impaired UE functional use, Pain, Decreased activity tolerance, Decreased scar mobility, Improper body mechanics  Visit Diagnosis: Pain in left shoulder  Pain in left wrist  Stiffness of left wrist, not elsewhere classified  Muscle weakness (generalized)     Problem List Patient Active Problem List   Diagnosis Date Noted  . 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 Distal radius fracture 03/29/2016    Kayton Ripp   PTA  07/25/2016, 6:18 PM  [facility] [address] [place], [place], [address] Phone: [phone]   Fax:  [phone]  Name: Regina Munroember A Daniel MRN: [id number] Date of Birth: 04/23/1971

## 2016-07-25 NOTE — Patient Instructions (Signed)
Over Head Pull: Narrow Grip       On back, knees bent, feet flat, band across thighs, elbows straight but relaxed. Pull hands apart (start). Keeping elbows straight, bring arms up and over head, hands toward floor. Keep pull steady on band. Hold momentarily. Return slowly, keeping pull steady, back to start. Repeat __1-_ times. Band color __Yellow____   Side Pull: Double Arm   On back, knees bent, feet flat. Arms perpendicular to body, shoulder level, elbows straight but relaxed. Pull arms out to sides, elbows straight. Resistance band comes across collarbones, hands toward floor. Hold momentarily. Slowly return to starting position. Repeat 10___ times. Band color ___yellow__   Sash   On back, knees bent, feet flat, left hand on left hip, right hand above left. Pull right arm DIAGONALLY (hip to shoulder) across chest. Bring right arm along head toward floor. Hold momentarily. Slowly return to starting position. Repeat _10__ times. Do with left arm. Band color _Yellow_____   Shoulder Rotation: Double Arm   On back, knees bent, feet flat, elbows tucked at sides, bent 90, hands palms up. Pull hands apart and down toward floor, keeping elbows near sides. Hold momentarily. Slowly return to starting position. Repeat __10_ times. Band color _Yellow_____

## 2016-08-06 ENCOUNTER — Ambulatory Visit: Payer: No Typology Code available for payment source | Attending: Physician Assistant | Admitting: Physical Therapy

## 2016-08-06 DIAGNOSIS — M25512 Pain in left shoulder: Secondary | ICD-10-CM | POA: Diagnosis present

## 2016-08-06 DIAGNOSIS — M6281 Muscle weakness (generalized): Secondary | ICD-10-CM

## 2016-08-06 DIAGNOSIS — M25532 Pain in left wrist: Secondary | ICD-10-CM | POA: Diagnosis present

## 2016-08-06 DIAGNOSIS — M25632 Stiffness of left wrist, not elsewhere classified: Secondary | ICD-10-CM

## 2016-08-06 NOTE — Therapy (Signed)
[facility] [address] [place], [place], [address] Phone: [phone]   Fax:  [phone]  Physical Therapy Treatment  Patient Details  Name: Regina Daniel MRN: [id number] Date of Birth: 1971-04-05 Referring Provider: Kirtland Bouchard PA-C  Encounter Date: 08/06/2016      PT End of Session - 08/06/16 1824    Visit Number 7   Number of Visits 13   Date for PT Re-Evaluation 08/09/16   PT Start Time 1419   PT Stop Time 1500   PT Time Calculation (min) 41 min   Activity Tolerance Patient tolerated treatment well   Behavior During Therapy [facility] for tasks assessed/performed;Anxious      Past Medical History:  Diagnosis Date  . Drug abuse   . Left wrist fracture JAN 6.2017  . 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 CHOLECYSTECTOMY    . NASAL SEPTUM SURGERY  MARCH 2017  . ORIF WRIST FRACTURE Left 03/29/2016   Procedure: LEFT WRIST OSTEOTOMY WITH OPEN REDUCTION INTERNAL FIXATION (ORIF) ;  Surgeon: Kathryne Hitch, MD;  Location: [facility];  Service: Orthopedics;  Laterality: Left;  Left Intra-clavicular block  . TUBAL LIGATION    . WRIST SURGERY Right YEARS AGO   TENDON REPAIR    There were no vitals filed for this visit.      Subjective Assessment - 08/06/16 1423    Subjective Mowed the grass yesterday.  MD said It would take a year.     Currently in Pain? Yes   Pain Score 6    Pain Location Wrist   Pain Orientation Left   Pain Descriptors / Indicators Shooting   Pain Radiating Towards wrist to shoulder   Pain Frequency Intermittent   Aggravating Factors  mowing grass push mower   Pain Relieving Factors muscle relaxers   Multiple Pain Sites No            [facility] PT Assessment - 08/06/16 0001      AROM   Left  Wrist Extension 29 Degrees  measured after manual     PROM   Overall PROM  --  35 PROM                     [facility] Adult PT Treatment/Exercise - 08/06/16 0001      Self-Care   Self-Care --  Vocational rehab info      Elbow Exercises   Forearm Supination 10 reps   Wrist Flexion 10 reps   Wrist Extension 10 reps     Hand Exercises   Opposition 5 reps   Opposition Limitations sore   Tendon Glides 4 glides 5-10 X each,  sensitive thumb area with these   Rubberbands intrinsic, 10 X each HEP     Wrist Exercises   Other wrist exercises --   Other wrist exercises wrist stretch into extension 3 x 20-10 seconds,  sore today     Manual Therapy   Manual Therapy Soft tissue mobilization  arm to shoulder   Manual therapy comments scar tissue mobilization,  dull achey with medial glides of scar.                PT Education - 08/06/16 1454    Education provided  Yes   Education Details intrinsics   Person(s) Educated Patient   Methods Explanation;Demonstration;Tactile cues;Verbal cues;Handout   Comprehension Verbalized understanding;Returned demonstration          PT Short Term Goals - 08/06/16 1830      PT SHORT TERM GOAL #1   Title Pt will verbalize average shoulder pain <7/10 by 9/15   Status Achieved     PT SHORT TERM GOAL #2   Title Pt will be able to lift 5lb weight with L wrist pain <5/10   Baseline able to lift / carrygallon of bleach, pain not assessed.   Time 3   Period Weeks   Status On-going           PT Long Term Goals - 07/25/16 1816      PT LONG TERM GOAL #1   Title Grip strength within 10 lb R to L to improve functional use of hand by 10/6   Baseline 30 pounds   Time 6   Period Weeks   Status On-going     PT LONG TERM GOAL #2   Title Pt will demonstrate GHJ ROM flx and abd to at least 150 to improve functional reach   Time 6   Period Weeks   Status Unable to assess     PT LONG TERM GOAL #3   Title Pt will be able to  perform all self care activities for she and her dog with average pain <3/10 to decrease pain effects on function   Baseline less pain with dog care.   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 Vocation rehab employment handout printed for patient and phone number given.  Encouragement needede for her to continue with her exercises.  Progress toward ROM goals. Shoulder pain has returned .   PT Next Visit Plan review finger bands,  continue scar tissue work,  stretch.  encourage to stay positive.   PT Home Exercise Plan intrinsic, rubberbands   Consulted and Agree with Plan of Care Patient      Patient will benefit from skilled therapeutic intervention in order to improve the following deficits and impairments:  Postural dysfunction, Decreased strength, Decreased range of motion, Increased muscle spasms, Impaired UE functional use, Pain, Decreased activity tolerance, Decreased scar mobility, Improper body mechanics  Visit Diagnosis: Left shoulder pain, unspecified chronicity  Pain in left wrist  Stiffness of left wrist, not elsewhere classified  Muscle weakness (generalized)     Problem List Patient Active Problem List   Diagnosis Date Noted  . 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 Distal radius fracture 03/29/2016    Taffy Delconte PTA 08/06/2016, 6:33 PM  [facility] [address] [place],  [place], [address] Phone: [phone]  Fax:  [phone]  Name: Regina Daniel MRN: [id number] Date of Birth: [birth year]

## 2016-08-06 NOTE — Patient Instructions (Signed)
Abduction (Resistive Strengthening of Intrinsics)    With rubber band around index and long fingers, held straight or resting on table, stretch fingers apart. Hold _1-2___ seconds. Repeat __10__ times. Repeat with long and ring fingers, then with ring and little fingers. Do __1__ sessions per day.  Copyright  VHI. All rights reserved.

## 2016-08-08 ENCOUNTER — Ambulatory Visit: Payer: No Typology Code available for payment source | Admitting: Physical Therapy

## 2016-08-13 ENCOUNTER — Encounter: Payer: Self-pay | Admitting: Physical Therapy

## 2016-08-13 ENCOUNTER — Ambulatory Visit: Payer: No Typology Code available for payment source | Admitting: Physical Therapy

## 2016-08-13 DIAGNOSIS — M25512 Pain in left shoulder: Secondary | ICD-10-CM | POA: Diagnosis not present

## 2016-08-13 DIAGNOSIS — M25632 Stiffness of left wrist, not elsewhere classified: Secondary | ICD-10-CM

## 2016-08-13 DIAGNOSIS — M6281 Muscle weakness (generalized): Secondary | ICD-10-CM

## 2016-08-13 DIAGNOSIS — M25532 Pain in left wrist: Secondary | ICD-10-CM

## 2016-08-13 NOTE — Therapy (Signed)
[facility] [address] [place], [place], [address] Phone: [phone]   Fax:  [phone]  Physical Therapy Treatment  Patient Details  Name: Regina Daniel MRN: [id number] Date of Birth: [birth year] Referring Provider: Kirtland Bouchard PA-C  Encounter Date: 08/13/2016      PT End of Session - 08/13/16 1414    Visit Number 8   Number of Visits 13   Date for PT Re-Evaluation 08/09/16   Authorization Type self pay   PT Start Time 1415   PT Stop Time 1456   PT Time Calculation (min) 41 min   Activity Tolerance Patient tolerated treatment well  encouragement needed   Behavior During Therapy [facility] for tasks assessed/performed;Anxious      Past Medical History:  Diagnosis Date  . Drug abuse   . Left wrist fracture JAN 6.2017  . 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 CHOLECYSTECTOMY    . NASAL SEPTUM SURGERY  MARCH 2017  . ORIF WRIST FRACTURE Left 03/29/2016   Procedure: LEFT WRIST OSTEOTOMY WITH OPEN REDUCTION INTERNAL FIXATION (ORIF) ;  Surgeon: Kathryne Hitch, MD;  Location: [facility];  Service: Orthopedics;  Laterality: Left;  Left Intra-clavicular block  . TUBAL LIGATION    . WRIST SURGERY Right YEARS AGO   TENDON REPAIR    There were no vitals filed for this visit.      Subjective Assessment - 08/13/16 1415    Subjective pt reports being in touch with vocational rehab and will be beginning work soon. Did hedge clipping which increased soreness.    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 Yes   Pain Score 0-No pain   Pain Location Wrist   Pain Orientation Left   Pain Descriptors / Indicators Aching  off/on            [facility] PT Assessment  - 08/13/16 0001      AROM   Left Wrist Extension 15 Degrees     Strength   Left Shoulder Flexion 4+/5   Left Shoulder Extension 5/5   Left Shoulder ABduction 5/5   Left Shoulder Internal Rotation 5/5   Left Shoulder External Rotation 5/5   Left Hand Grip (lbs) 30  R 40                     [facility] Adult PT Treatment/Exercise - 08/13/16 0001      Shoulder Exercises: Seated   Other Seated Exercises seated wrist flexion/extension x15 each 3#     Shoulder Exercises: Standing   Flexion 20 reps   Shoulder Flexion Weight (lbs) 3   Flexion Limitations reach for overhead shelf     Shoulder Exercises: ROM/Strengthening   UBE (Upper Arm Bike) 6' retro     Wrist Exercises   Other wrist exercises extension stretch at counter   Other wrist exercises velcroe turning                PT Education - 08/13/16 1443    Education provided Yes   Education Details progression of strength/ROM, return to work, 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, music) Educated Patient   Methods Explanation;Demonstration;Tactile cues;Verbal cues  Comprehension Verbalized understanding;Returned demonstration;Verbal cues required;Tactile cues required;Need further instruction          PT Short Term Goals - 08/13/16 1427      PT SHORT TERM GOAL #1   Title Pt will verbalize average shoulder pain <7/10 by 9/15   Status Achieved     PT SHORT TERM GOAL #2   Title Pt will be able to lift 5lb weight with L wrist pain <5/10   Baseline 2/10, more strenuous than painful   Status Achieved           PT Long Term Goals - 08/13/16 1428      PT LONG TERM GOAL #1   Title Grip strength within 10 lb R to L to improve functional use of hand by 10/6   Status Achieved     PT LONG TERM GOAL #2   Title Pt will demonstrate GHJ ROM flx and abd to at least 150 to improve functional reach   Baseline abd 150, flx 165   Status Achieved     PT LONG TERM GOAL #3   Title Pt will be able to perform all self care  activities for she and her dog with average pain <3/10 to decrease pain effects on function   Baseline "feels normal"  strenuous   Status Achieved     PT LONG TERM GOAL #4   Title Pt will be able to carry 10lb purse/grocery bag in L hand pain <3/10   Status On-going     PT LONG TERM GOAL #5   Title Pt will verbalize improving confidence and ability toward returning to work utilizing L UE   Baseline still forming confidence, open to trying anything   Status On-going               Plan - 08/13/16 1505    Clinical Impression Statement Pt has made significant improvements in strength and ROM. Continues to be challenged by ADLs and work activities as expected. Discussed soreness with all activities and how she is not permanently handicapped, told her she will continue to get better but will take time.    PT Next Visit Plan review finger bands,  continue scar tissue work,  stretch.  encourage to stay positive.   Consulted and Agree with Plan of Care Patient      Patient will benefit from skilled therapeutic intervention in order to improve the following deficits and impairments:     Visit Diagnosis: Left shoulder pain, unspecified chronicity - Plan: PT plan of care cert/re-cert  Pain in left wrist - Plan: PT plan of care cert/re-cert  Stiffness of left wrist, not elsewhere classified - Plan: PT plan of care cert/re-cert  Muscle weakness (generalized) - Plan: PT plan of care cert/re-cert     Problem List Patient Active Problem List   Diagnosis Date Noted  . 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 Distal radius fracture 03/29/2016   Jaime Grizzell C. Laelia Angelo PT, DPT 08/13/16 3:09 PM   [facility] [address] [place], [place], [address] Phone: [phone]   Fax:  [phone]  Name: Elouise Munroember A Wilson MRN: [id number] Date of Birth: 08-12-1971

## 2016-08-15 ENCOUNTER — Ambulatory Visit: Payer: No Typology Code available for payment source | Admitting: Physical Therapy

## 2016-08-15 DIAGNOSIS — M25512 Pain in left shoulder: Secondary | ICD-10-CM | POA: Diagnosis not present

## 2016-08-15 DIAGNOSIS — M25632 Stiffness of left wrist, not elsewhere classified: Secondary | ICD-10-CM

## 2016-08-15 DIAGNOSIS — M6281 Muscle weakness (generalized): Secondary | ICD-10-CM

## 2016-08-15 DIAGNOSIS — M25532 Pain in left wrist: Secondary | ICD-10-CM

## 2016-08-15 NOTE — Therapy (Signed)
[place], [place], [address] Phone: [phone]   Fax:  [phone]  Physical Therapy Treatment/Discharge Summary  Patient Details  Name: Regina Daniel MRN: [id number] Date of Birth: 05-13-1971 Referring Provider: Pete Pelt PA-C  Encounter Date: 08/15/2016      PT End of Session - 08/15/16 1414    Visit Number 9   Number of Visits 13   Date for PT Re-Evaluation 08/15/16   Authorization Type self pay   PT Start Time 1415   PT Stop Time 1440   PT Time Calculation (min) 25 min   Activity Tolerance Patient tolerated treatment well;Patient limited by pain   Behavior During Therapy [facility] for tasks assessed/performed      Past Medical History:  Diagnosis Date  . Drug abuse   . Left wrist fracture JAN 6.2017  . 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 CHOLECYSTECTOMY    . NASAL SEPTUM SURGERY  MARCH 2017  . ORIF WRIST FRACTURE Left 03/29/2016   Procedure: LEFT WRIST OSTEOTOMY WITH OPEN REDUCTION INTERNAL FIXATION (ORIF) ;  Surgeon: Mcarthur Rossetti, MD;  Location: [facility];  Service: Orthopedics;  Laterality: Left;  Left Intra-clavicular block  . TUBAL LIGATION    . WRIST SURGERY Right YEARS AGO   TENDON REPAIR    There were no vitals filed for this visit.      Subjective Assessment - 08/15/16 1415    Subjective Pt reports being able to give both dogs a bath yesterday without notable soreness in wrist/hand/shoulder. Verbalized readiness for d/c.    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 No/denies                         [facility] Adult PT Treatment/Exercise - 08/15/16 0001      Shoulder Exercises: Standing   Flexion 20 reps   Shoulder Flexion Weight (lbs) 3   Flexion Limitations reach for overhead shelf     Shoulder Exercises: ROM/Strengthening   UBE (Upper Arm Bike) 6' retro L 1.5     Hand Exercises   Tendon Glides tendon glides     Wrist Exercises   Other wrist exercises flexion/ext stretch at table                PT Education - 08/15/16 1419    Education provided Yes   Education Details exercise form/rationale, HEP, posture, functional use of UE   Person(s) Educated Patient   Methods Explanation;Demonstration;Tactile cues;Verbal cues   Comprehension Verbalized understanding;Returned demonstration;Verbal cues required;Tactile cues required          PT Short Term Goals - 08/13/16 1427      PT SHORT TERM GOAL #1   Title Pt will verbalize average shoulder pain <7/10 by 9/15   Status Achieved     PT SHORT TERM GOAL #2   Title Pt will be able to lift 5lb weight with L wrist pain <5/10   Baseline 2/10, more strenuous than painful   Status Achieved           PT Long Term Goals - 08/13/16 1428  PT LONG TERM GOAL #1   Title Grip strength within 10 lb R to L to improve functional use of hand by 10/6   Status Achieved     PT LONG TERM GOAL #2   Title Pt will demonstrate GHJ ROM flx and abd to at least 150 to improve functional reach   Baseline abd 150, flx 165   Status Achieved     PT LONG TERM GOAL #3   Title Pt will be able to perform all self care activities for she and her dog with average pain <3/10 to decrease pain effects on function   Baseline "feels normal"  strenuous   Status Achieved     PT LONG TERM GOAL #4   Title Pt will be able to carry 10lb purse/grocery bag in L hand pain <3/10   Status On-going     PT LONG TERM GOAL #5   Title Pt will verbalize improving confidence and ability toward returning to work utilizing L UE   Baseline still forming confidence, open to trying anything   Status On-going               Plan - 08/15/16  1444    Clinical Impression Statement Educated pt in importance of posture, stretching and continued use of shoulder/wrist/hand to return to PLOF. Pt is ready for d/c and verbalized comfort and understanding of HEP. Instructed to contact us with any furthe questions or needs.    PT Next Visit Plan review finger bands,  continue scar tissue work,  stretch.  encourage to stay positive.   Consulted and Agree with Plan of Care Patient      Patient will benefit from skilled therapeutic intervention in order to improve the following deficits and impairments:  Postural dysfunction, Decreased strength, Decreased range of motion, Increased muscle spasms, Impaired UE functional use, Pain, Decreased activity tolerance, Decreased scar mobility, Improper body mechanics  Visit Diagnosis: Left shoulder pain, unspecified chronicity  Pain in left wrist  Stiffness of left wrist, not elsewhere classified  Muscle weakness (generalized)     Problem List Patient Active Problem List   Diagnosis Date Noted  . 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 Distal radius fracture 03/29/2016    PHYSICAL THERAPY DISCHARGE SUMMARY  Visits from Start of Care: 9  Current functional level related to goals / functional outcomes: See above   Remaining deficits: See above   Education / Equipment: Anatomy of condition, POC, HEP, exercise form/rationale  Plan: Patient agrees to discharge.  Patient goals were met. Patient is being discharged due to meeting the stated rehab goals.  ?????      Jobin Montelongo C. Deniz Eskridge PT, DPT 08/15/16 2:48 PM   [facility] [address] [place], [place], [address] Phone: [phone]   Fax:  [phone]  Name: Regina Daniel MRN: [id number] Date of Birth: July 04, 1971

## 2016-09-17 ENCOUNTER — Telehealth (INDEPENDENT_AMBULATORY_CARE_PROVIDER_SITE_OTHER): Payer: Self-pay | Admitting: *Deleted

## 2016-09-17 NOTE — Telephone Encounter (Signed)
Pt. Called stating she needed to discuss her situation about her wrist and work situation with Bronson CurbGil. Pt stated he would know and would not disclose any more information. Pt requesting call back [phone].

## 2016-09-17 NOTE — Telephone Encounter (Signed)
Patient wanted to let us know to maybe right her a rating for voc rehab, Bronson CurbGil said he will discuss this with her at her appt on the 27th

## 2016-09-19 ENCOUNTER — Encounter (HOSPITAL_COMMUNITY): Payer: Self-pay | Admitting: Emergency Medicine

## 2016-09-19 ENCOUNTER — Emergency Department (HOSPITAL_COMMUNITY)
Admission: EM | Admit: 2016-09-19 | Discharge: 2016-09-19 | Disposition: A | Discharge status: Home / Self Care | Payer: No Typology Code available for payment source | Attending: Emergency Medicine | Admitting: Emergency Medicine

## 2016-09-19 ENCOUNTER — Emergency Department (HOSPITAL_COMMUNITY): Payer: No Typology Code available for payment source

## 2016-09-19 DIAGNOSIS — F17210 Nicotine dependence, cigarettes, uncomplicated: Secondary | ICD-10-CM | POA: Diagnosis not present

## 2016-09-19 DIAGNOSIS — R109 Unspecified abdominal pain: Secondary | ICD-10-CM

## 2016-09-19 DIAGNOSIS — Z79899 Other long term (current) drug therapy: Secondary | ICD-10-CM | POA: Diagnosis not present

## 2016-09-19 DIAGNOSIS — M62830 Muscle spasm of back: Secondary | ICD-10-CM | POA: Insufficient documentation

## 2016-09-19 LAB — URINALYSIS, ROUTINE W REFLEX MICROSCOPIC
BILIRUBIN URINE: NEGATIVE
GLUCOSE, UA: NEGATIVE mg/dL
HGB URINE DIPSTICK: NEGATIVE
KETONES UR: NEGATIVE mg/dL
Leukocytes, UA: NEGATIVE
Nitrite: NEGATIVE
PROTEIN: NEGATIVE mg/dL
Specific Gravity, Urine: 1.014 (ref 1.005–1.030)
pH: 6 (ref 5.0–8.0)

## 2016-09-19 LAB — I-STAT BETA HCG BLOOD, ED (MC, WL, AP ONLY): I-stat hCG, quantitative: 7.3 m[IU]/mL — ABNORMAL HIGH (ref <5)

## 2016-09-19 LAB — CBC
HEMATOCRIT: 37.5 % (ref 36.0–46.0)
Hemoglobin: 12.9 g/dL (ref 12.0–15.0)
MCH: 31.1 pg (ref 26.0–34.0)
MCHC: 34.4 g/dL (ref 30.0–36.0)
MCV: 90.4 fL (ref 78.0–100.0)
Platelets: 162 10*3/uL (ref 150–400)
RBC: 4.15 MIL/uL (ref 3.87–5.11)
RDW: 12 % (ref 11.5–15.5)
WBC: 5.4 10*3/uL (ref 4.0–10.5)

## 2016-09-19 LAB — BASIC METABOLIC PANEL
Anion gap: 4 — ABNORMAL LOW (ref 5–15)
BUN: 14 mg/dL (ref 6–20)
CO2: 28 mmol/L (ref 22–32)
CREATININE: 0.98 mg/dL (ref 0.44–1.00)
Calcium: 9.4 mg/dL (ref 8.9–10.3)
Chloride: 107 mmol/L (ref 101–111)
GFR calc Af Amer: >60 mL/min (ref >60)
GLUCOSE: 93 mg/dL (ref 65–99)
POTASSIUM: 4 mmol/L (ref 3.5–5.1)
Sodium: 139 mmol/L (ref 135–145)

## 2016-09-19 LAB — HCG, QUANTITATIVE, PREGNANCY: hCG, Beta Chain, Quant, S: 8 m[IU]/mL — ABNORMAL HIGH (ref <5)

## 2016-09-19 MED ORDER — METHOCARBAMOL 500 MG PO TABS
1000.0000 mg | ORAL_TABLET | Freq: Once | ORAL | Status: AC
  Administered 2016-09-19: 1000 mg via ORAL
  Filled 2016-09-19: qty 2

## 2016-09-19 MED ORDER — METHOCARBAMOL 500 MG PO TABS: 1000.0000 mg | ORAL_TABLET | Freq: Two times a day (BID) | ORAL | 0 refills | Status: DC

## 2016-09-19 NOTE — ED Provider Notes (Signed)
[facility]-EMERGENCY DEPT Provider Note   CSN: [id number] Arrival date & time: 09/19/16  1354     History   Chief Complaint Chief Complaint  Patient presents with  . Flank Pain    HPI Regina Daniel is a 45 y.o. female.  HPI Patient presents with 20 years of right flank pain. 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 She's not been evaluated for this since her initial diagnosis. She denies dysuria, hematuria, frequency, incontinence. No fever or chills. States the pain worsened 4 days ago. Pain is worse with movement and palpation. No radiation to the legs. No focal weakness or numbness. Past Medical History:  Diagnosis Date  . Drug abuse   . Left wrist fracture JAN 6.2017  . 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 Renal disorder    RIGHT KIDNEY WAS DETERORRITING DUE TO DRUG ABUSE, PT TOLD THIS YEARS AGO    Patient Active Problem List   Diagnosis Date Noted  . 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 Distal radius fracture 03/29/2016    Past Surgical History:  Procedure Laterality Date  . CHOLECYSTECTOMY    . NASAL SEPTUM SURGERY  MARCH 2017  . ORIF WRIST FRACTURE Left 03/29/2016   Procedure: LEFT WRIST OSTEOTOMY WITH OPEN REDUCTION INTERNAL FIXATION (ORIF) ;  Surgeon: Kathryne Hitchhristopher Y Blackman, MD;  Location: [facility];  Service: Orthopedics;  Laterality: Left;  Left Intra-clavicular block  . TUBAL LIGATION    . WRIST SURGERY Right YEARS AGO   TENDON REPAIR    OB History    No data available       Home Medications    Prior to Admission medications   Medication Sig Start Date End Date Taking? Authorizing Provider  naproxen sodium (ANAPROX) 220 MG tablet Take 660 mg by mouth 2 (two) times daily as needed (pain).   Yes Historical Provider, MD  oxyCODONE-acetaminophen (PERCOCET/ROXICET) 5-325 MG tablet Take 1 tablet by mouth every 4  (four) hours as needed for severe pain.   Yes Historical Provider, MD  acetaminophen-codeine 120-12 MG/5ML solution Take by mouth every 8 (eight) hours as needed for moderate pain.    Historical Provider, MD  calamine lotion Apply 1 application topically as needed for itching. TO POISON OAK ON LEFT AND RIGHT ARMS    Historical Provider, MD  methocarbamol (ROBAXIN) 500 MG tablet Take 2 tablets (1,000 mg total) by mouth 2 (two) times daily. 09/19/16   Loren Raceravid Darcie Mellone, MD  oxyCODONE-acetaminophen (ROXICET) 5-325 MG tablet Take 1-2 tablets by mouth every 4 (four) hours as needed. Patient not taking: Reported on 09/19/2016 03/30/16   Kathryne Hitchhristopher Y Blackman, MD    Family History No family history on file.  Social History Social History  Substance Use Topics  . Smoking status: Current Every Day Smoker    Packs/day: 0.50    Years: 32.00    Types: Cigarettes  . Smokeless tobacco: Never Used     Comment: USING E CIGARETTE NOW  . Alcohol use No     Allergies   Other   Review of Systems Review of Systems  Constitutional: Negative for chills, fatigue and fever.  Respiratory: Negative for shortness of breath.   Cardiovascular: Negative for chest pain.  Gastrointestinal: Positive for nausea. Negative for abdominal pain, diarrhea and vomiting.  Genitourinary: Positive for flank pain. Negative for dysuria, frequency, pelvic pain,  vaginal bleeding and vaginal discharge.  Musculoskeletal: Positive for back pain and myalgias. Negative for neck pain and neck stiffness.  Skin: Negative for rash and wound.  Neurological: Negative for dizziness, weakness, light-headedness, numbness and headaches.  All other systems reviewed and are negative.    Physical Exam Updated Vital Signs BP 98/86 (BP Location: Left Arm)   Pulse 61   Temp 98.1 F (36.7 C) (Oral)   Resp 18   LMP 11/04/2010   SpO2 99%   Physical Exam  Constitutional: She is oriented to person, place, and time. She appears  well-developed and well-nourished. No distress.  HENT:  Head: Normocephalic and atraumatic.  Mouth/Throat: Oropharynx is clear and moist.  Eyes: EOM are normal. Pupils are equal, round, and reactive to light.  Neck: Normal range of motion. Neck supple.  Cardiovascular: Normal rate and regular rhythm.  Exam reveals no gallop and no friction rub.   No murmur heard. Pulmonary/Chest: Effort normal and breath sounds normal. No respiratory distress. She has no wheezes. She has no rales. She exhibits no tenderness.  Abdominal: Soft. Bowel sounds are normal. There is no tenderness. There is no rebound and no guarding.  Musculoskeletal: Normal range of motion. She exhibits tenderness. She exhibits no edema.  No midline thoracic or lumbar tenderness. Patient has diffuse right-sided paraspinal lumbar tenderness to palpation. No definite CVA tenderness. Negative straight leg raise bilaterally. 2+ distal pulses. No lower extremity swelling, asymmetry or tenderness.  Neurological: She is alert and oriented to person, place, and time.  5/5 motor in all extremity is. Sensation is intact. No saddle anesthesia. Patient ambulatory.  Skin: Skin is warm and dry. Capillary refill takes less than 2 seconds. No rash noted. No erythema.  Psychiatric: She has a normal mood and affect. Her behavior is normal.  Nursing note and vitals reviewed.    ED Treatments / Results  Labs (all labs ordered are listed, but only abnormal results are displayed) Labs Reviewed  BASIC METABOLIC PANEL - Abnormal; Notable for the following:       Result Value   Anion gap 4 (*)    All other components within normal limits  HCG, QUANTITATIVE, PREGNANCY - Abnormal; Notable for the following:    hCG, Beta Chain, Quant, S 8 (*)    All other components within normal limits  I-STAT BETA HCG BLOOD, ED ([facility], [facility], [facility] ONLY) - Abnormal; Notable for the following:    I-stat hCG, quantitative 7.3 (*)    All other components within normal limits    URINALYSIS, ROUTINE W REFLEX MICROSCOPIC (NOT AT [facility])  CBC    EKG  EKG Interpretation None       Radiology [place] Renal  Result Date: 09/19/2016 CLINICAL DATA:  45 y/o  F; right flank pain for 10 years. EXAM: RENAL / URINARY TRACT ULTRASOUND COMPLETE COMPARISON:  09/22/2014 CT abdomen and pelvis FINDINGS: Right Kidney: Length: 8.8 cm. Echogenicity within normal limits. No mass or hydronephrosis visualized. Interpolar cortical defect similar to prior CT. Left Kidney: Length: 9.6 cm. Echogenicity within normal limits. No mass or hydronephrosis visualized. Bladder: Appears normal for degree of bladder distention. Bilateral renal jets noted. IMPRESSION: No mass or hydronephrosis of the kidneys visualized. Electronically Signed   By: Mitzi HansenLance  Furusawa-Stratton M.D.   On: 09/19/2016 20:02    Procedures Procedures (including critical care time)  Medications Ordered in ED Medications  methocarbamol (ROBAXIN) tablet 1,000 mg (not administered)     Initial Impression / Assessment and Plan / ED Course  I have reviewed the triage vital signs and the nursing notes.  Pertinent labs & imaging results that were available during my care of the patient were reviewed by me and considered in my medical decision making (see chart for details).  Clinical Course    Cortical thinning of uncertain significance. The patient's pain seems to be more muscular in nature. We'll have her follow-up with nephrology will treat with muscle relaxants. Advised heat and Tylenol.   HCG is 8. Patient states she is postmenopausal and has not had a period in 3 years she also has had a tubal ligation.  Final Clinical Impressions(s) / ED Diagnoses   Final diagnoses:  Right flank pain  Lumbar paraspinal muscle spasm    New Prescriptions New Prescriptions   METHOCARBAMOL (ROBAXIN) 500 MG TABLET    Take 2 tablets (1,000 mg total) by mouth 2 (two) times daily.     Loren Racer, MD 09/19/16 [phone]

## 2016-09-19 NOTE — ED Triage Notes (Signed)
Per pt, states right flank pain for a week-states same symptoms years ago-no dysuria

## 2016-10-30 ENCOUNTER — Ambulatory Visit (INDEPENDENT_AMBULATORY_CARE_PROVIDER_SITE_OTHER): Payer: Self-pay | Admitting: Physician Assistant

## 2016-10-31 ENCOUNTER — Encounter (INDEPENDENT_AMBULATORY_CARE_PROVIDER_SITE_OTHER): Payer: Self-pay | Admitting: Physician Assistant

## 2016-10-31 ENCOUNTER — Ambulatory Visit (INDEPENDENT_AMBULATORY_CARE_PROVIDER_SITE_OTHER): Payer: Self-pay

## 2016-10-31 ENCOUNTER — Ambulatory Visit (INDEPENDENT_AMBULATORY_CARE_PROVIDER_SITE_OTHER): Payer: No Typology Code available for payment source | Admitting: Physician Assistant

## 2016-10-31 DIAGNOSIS — M25532 Pain in left wrist: Secondary | ICD-10-CM | POA: Diagnosis not present

## 2016-10-31 MED ORDER — METHOCARBAMOL 500 MG PO TABS: 1000.0000 mg | ORAL_TABLET | Freq: Two times a day (BID) | ORAL | 1 refills | Status: DC

## 2016-10-31 NOTE — Progress Notes (Signed)
Office Visit Note   Patient: Regina Daniel           Date of Birth: 06-May-1971           MRN: [id number] Visit Date: 10/31/2016              Requested by: No referring provider defined for this encounter. PCP: Pcp Not In System   Assessment & Plan: Visit Diagnoses:  1. Pain in left wrist     Plan: forms filled out for vocational rehab in regards to her work status. She'll continue to work on her range of motion and strengthening left wrist and hand. We explained to her today that most likely it will take a full year for her to be at [facility], this is from the date of her surgery.See her back in May and at that point in time most likely rate and release.  Follow-Up Instructions: Return in about 5 months (around 03/31/2017).   Orders:  Orders Placed This Encounter  Procedures  . XR Wrist Complete Left   Meds ordered this encounter  Medications  . methocarbamol (ROBAXIN) 500 MG tablet    Sig: Take 2 tablets (1,000 mg total) by mouth 2 (two) times daily.    Dispense:  40 tablet    Refill:  1      Procedures: No procedures performed   Clinical Data: No additional findings.   Subjective: Chief Complaint  Patient presents with  . Left Wrist - Follow-up    HPI Regina Daniel returns today now 7 months status post left wrist osteotomy with open reduction internal fixation. Still has pain, weakness and decreased range of motion of the left wrist. Reports that 3 weeks [place] 7 steps but did not injury her left wrist. Review of Systems   Objective: Vital Signs: LMP 11/04/2010   Physical Exam  Ortho Exam left wrist full supination and pronation Decreased flexion and extension of the wrist. Radial pulses present sensation intact throughout the hand. Surgical incisions well healed. Specialty Comments:  No specialty comments available.  Imaging: No results found.   PMFS History: Patient Active Problem List   Diagnosis Date Noted  . Traumatic closed displaced  fracture of distal end of left radius with malunion 03/29/2016  . Distal radius fracture 03/29/2016   Past Medical History:  Diagnosis Date  . Drug abuse   . Left wrist fracture JAN 6.2017  . 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 Renal disorder    RIGHT KIDNEY WAS DETERORRITING DUE TO DRUG ABUSE, PT TOLD THIS YEARS AGO    No family history on file.  Past Surgical History:  Procedure Laterality Date  . CHOLECYSTECTOMY    . NASAL SEPTUM SURGERY  MARCH 2017  . ORIF WRIST FRACTURE Left 03/29/2016   Procedure: LEFT WRIST OSTEOTOMY WITH OPEN REDUCTION INTERNAL FIXATION (ORIF) ;  Surgeon: Kathryne Hitchhristopher Y Blackman, MD;  Location: [facility];  Service: Orthopedics;  Laterality: Left;  Left Intra-clavicular block  . TUBAL LIGATION    . WRIST SURGERY Right YEARS AGO   TENDON REPAIR   Social History   Occupational History  . Not on file.   Social History Main Topics  . Smoking status: Current Every Day Smoker    Packs/day: 0.50    Years: 32.00    Types: Cigarettes  . Smokeless tobacco: Never Used     Comment: USING E CIGARETTE NOW  . Alcohol use No  .  Drug use:     Types: Cocaine     Comment: last used 04-15-15  . Sexual activity: Not on file

## 2017-01-13 ENCOUNTER — Ambulatory Visit (INDEPENDENT_AMBULATORY_CARE_PROVIDER_SITE_OTHER): Payer: No Typology Code available for payment source | Admitting: Orthopaedic Surgery

## 2017-01-13 ENCOUNTER — Ambulatory Visit (INDEPENDENT_AMBULATORY_CARE_PROVIDER_SITE_OTHER): Payer: No Typology Code available for payment source | Admitting: Physician Assistant

## 2017-01-23 ENCOUNTER — Encounter (INDEPENDENT_AMBULATORY_CARE_PROVIDER_SITE_OTHER): Payer: Self-pay | Admitting: Physician Assistant

## 2017-01-23 ENCOUNTER — Ambulatory Visit (INDEPENDENT_AMBULATORY_CARE_PROVIDER_SITE_OTHER): Payer: Self-pay | Admitting: Physician Assistant

## 2017-01-23 DIAGNOSIS — S52502S Unspecified fracture of the lower end of left radius, sequela: Secondary | ICD-10-CM

## 2017-01-23 DIAGNOSIS — M25512 Pain in left shoulder: Secondary | ICD-10-CM | POA: Insufficient documentation

## 2017-01-23 MED ORDER — METHYLPREDNISOLONE ACETATE 40 MG/ML IJ SUSP
40.0000 mg | INTRAMUSCULAR | Status: AC | PRN
  Administered 2017-01-23: 40 mg via INTRA_ARTICULAR

## 2017-01-23 MED ORDER — LIDOCAINE HCL 1 % IJ SOLN
3.0000 mL | INTRAMUSCULAR | Status: AC | PRN
  Administered 2017-01-23: 3 mL

## 2017-01-23 NOTE — Progress Notes (Signed)
Office Visit Note   Patient: Regina Daniel           Date of Birth: [birth year]           MRN: [id number] Visit Date: 01/23/2017              Requested by: No referring provider defined for this encounter. PCP: Pcp Not In System   Assessment & Plan: Visit Diagnoses:  1. Acute pain of left shoulder   2. Closed fracture of distal end of left radius, unspecified fracture morphology, sequela     Plan:Due to patient's  left wrist osteotomy with open reduction internal fixation, permanent hardware decreased range of motion and scarring would give her a  permanent disability rating 35% left wrist. Regards to her shoulder she will begin pendulum exercises forward flexion exercises, exercises and wall crawls. We'll see her back in 2 weeks' check progress of the left shoulder status post cortisone injection.. Questions encouraged and answered.  Follow-Up Instructions: Return in about 2 weeks (around 02/06/2017).   Orders:  Orders Placed This Encounter  Procedures  . Large Joint Injection/Arthrocentesis   No orders of the defined types were placed in this encounter.     Procedures: Large Joint Inj Date/Time: 01/23/2017 3:14 PM Performed by: Kirtland BouchardLARK, Larken Urias W Authorized by: Kirtland BouchardLARK, Tarini Carrier W   Consent Given by:  Patient Indications:  Pain Location:  Shoulder Site:  L subacromial bursa Needle Size:  22 G Needle Length:  1.5 inches Approach:  Anterolateral Ultrasound Guidance: No   Fluoroscopic Guidance: No   Arthrogram: No   Medications:  40 mg methylPREDNISolone acetate 40 MG/ML; 3 mL lidocaine 1 % Aspiration Attempted: No   Patient tolerance:  Patient tolerated the procedure well with no immediate complications     Clinical Data: No additional findings.   Subjective: Chief Complaint  Patient presents with  . Left Shoulder - Pain  . Left Wrist - Pain, Follow-up    HPI Regina Daniel returns status post left wrist osteotomy with open reduction internal fixation  03/29/2016. She states she continues to have pain in the wrist and decreased range of motion. She does have some paperwork first developed in regards to her impairment with her wrist. Unfortunately she is having some pain in the left arm from the shoulder down to the hand. No numbness tingling down the arm. She reports that her father is over 200 pounds 7 on her wrist. She is helping care for him as he has radical problems. She denies any neck pain. She's had shoulder pain in the past some stone well with cortisone injection in the shoulder Mrs. back in August 2017. She's had no other injury to the shoulder. Review of Systems Denies any numbness tingling in left arm. Denies any neck pain. No shortness breath fevers chills. Chronic left wrist pain with decreased range of motion  Objective: Vital Signs: LMP 11/04/2010   Physical Exam  Constitutional: She is oriented to person, place, and time. She appears well-developed and well-nourished. No distress.  Pulmonary/Chest: Effort normal.  Neurological: She is alert and oriented to person, place, and time.    Ortho Exam Left radial pulse 2+. Sensation intact throughout the hand. Healed surgical incision over the volar aspect of the wrist. She has diminished dorsiflexion and volar flexion of the left wrist compared to the right which is full and range of motion after wrist approximately 35-40 of dorsiflexion and 40 of volar flexion. Limited radial and ulnar deviation of the left wrist.  She has full supination pronation of the left forearm. 5 out of 5 strengths bilateral shoulders with external and internal rotation against resistance. Empty can test negative. She has tenderness in the left trapezius region no tenderness over the medial border scapula. Positive impingement testing on the left negative on the right. Range of motion of the cervical spine without pain. Specialty Comments:  No specialty comments available.  Imaging: No results  found.   PMFS History: Patient Active Problem List   Diagnosis Date Noted  . Acute pain of left shoulder 01/23/2017  . 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 Distal radius fracture 03/29/2016   Past Medical History:  Diagnosis Date  . Drug abuse   . Left wrist fracture JAN 6.2017  . 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 Renal disorder    RIGHT KIDNEY WAS DETERORRITING DUE TO DRUG ABUSE, PT TOLD THIS YEARS AGO    No family history on file.  Past Surgical History:  Procedure Laterality Date  . CHOLECYSTECTOMY    . NASAL SEPTUM SURGERY  MARCH 2017  . ORIF WRIST FRACTURE Left 03/29/2016   Procedure: LEFT WRIST OSTEOTOMY WITH OPEN REDUCTION INTERNAL FIXATION (ORIF) ;  Surgeon: Kathryne Hitch, MD;  Location: [facility];  Service: Orthopedics;  Laterality: Left;  Left Intra-clavicular block  . TUBAL LIGATION    . WRIST SURGERY Right YEARS AGO   TENDON REPAIR   Social History   Occupational History  . Not on file.   Social History Main Topics  . Smoking status: Current Every Day Smoker    Packs/day: 0.50    Years: 32.00    Types: Cigarettes  . Smokeless tobacco: Never Used     Comment: USING E CIGARETTE NOW  . Alcohol use No  . Drug use: Yes    Types: Cocaine     Comment: last used 04-15-15  . Sexual activity: Not on file

## 2017-02-07 ENCOUNTER — Ambulatory Visit (INDEPENDENT_AMBULATORY_CARE_PROVIDER_SITE_OTHER): Payer: No Typology Code available for payment source | Admitting: Physician Assistant

## 2017-02-07 DIAGNOSIS — M25512 Pain in left shoulder: Secondary | ICD-10-CM

## 2017-02-07 NOTE — Progress Notes (Signed)
Mrs. Regina Daniel returns today follow-up low shoulder injection on 01/23/2017. She states she's having no pain in the shoulder whatsoever. Did have her range her shoulder today she had no limitations in range of motion. She has excellent strength with external and internal rotation against resistance bilateral shoulders. Negative impingement. She'll follow with [place] as needed

## 2017-03-27 ENCOUNTER — Ambulatory Visit (INDEPENDENT_AMBULATORY_CARE_PROVIDER_SITE_OTHER): Payer: No Typology Code available for payment source | Admitting: Orthopaedic Surgery

## 2017-05-10 ENCOUNTER — Encounter (HOSPITAL_COMMUNITY): Payer: Self-pay | Admitting: Emergency Medicine

## 2017-05-10 ENCOUNTER — Emergency Department (HOSPITAL_COMMUNITY)
Admission: EM | Admit: 2017-05-10 | Discharge: 2017-05-10 | Disposition: A | Discharge status: Home / Self Care | Payer: No Typology Code available for payment source | Attending: Emergency Medicine | Admitting: Emergency Medicine

## 2017-05-10 DIAGNOSIS — F17290 Nicotine dependence, other tobacco product, uncomplicated: Secondary | ICD-10-CM | POA: Insufficient documentation

## 2017-05-10 DIAGNOSIS — G43009 Migraine without aura, not intractable, without status migrainosus: Secondary | ICD-10-CM

## 2017-05-10 MED ORDER — DIPHENHYDRAMINE HCL 50 MG/ML IJ SOLN
25.0000 mg | Freq: Once | INTRAMUSCULAR | Status: AC
  Administered 2017-05-10: 25 mg via INTRAVENOUS
  Filled 2017-05-10: qty 1

## 2017-05-10 MED ORDER — SODIUM CHLORIDE 0.9 % IV BOLUS (SEPSIS)
500.0000 mL | Freq: Once | INTRAVENOUS | Status: AC
  Administered 2017-05-10: 500 mL via INTRAVENOUS

## 2017-05-10 MED ORDER — KETOROLAC TROMETHAMINE 30 MG/ML IJ SOLN
30.0000 mg | Freq: Once | INTRAMUSCULAR | Status: AC
  Administered 2017-05-10: 30 mg via INTRAVENOUS
  Filled 2017-05-10: qty 1

## 2017-05-10 MED ORDER — METOCLOPRAMIDE HCL 5 MG/ML IJ SOLN
10.0000 mg | Freq: Once | INTRAMUSCULAR | Status: AC
  Administered 2017-05-10: 10 mg via INTRAVENOUS
  Filled 2017-05-10: qty 2

## 2017-05-10 NOTE — ED Triage Notes (Signed)
Pt c/o headache that is intermittent that increases at night. Pt able to take ibuprofen with relief.

## 2017-05-10 NOTE — Discharge Instructions (Signed)
If you develop new or worsening symptoms, please return to the emergency department for reevaluation. You can call [place] [facility] to get established with primary care.

## 2017-05-10 NOTE — ED Provider Notes (Signed)
[facility] Provider Note   CSN: [id number] Arrival date & time: 05/10/17  1011     History   Chief Complaint Chief Complaint  Patient presents with  . Headache    HPI Regina Daniel is a 46 y.o. female with a h/o migraines who presents to the ED for a constant, worsening HA that began at approximately 11:30 last night with associated photophobia and nausea. She denies phonophobia, fever, chills, visual changes, numbness, weakness, emesis, dizziness, or lightheadedness. She reports she is usually able to control her HAs at home with ibuprofen and Excedrin, but states no improvement after treatment at home. She reports she has been under an increased amount of stress at work and home recently. LMP in 2012 d/t hysterectomy. She is not established with neurology. No PCP.   The history is provided by the patient. No language interpreter was used.    Past Medical History:  Diagnosis Date  . Drug abuse   . Left wrist fracture JAN 6.2017  . 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 Renal disorder    RIGHT KIDNEY WAS DETERORRITING DUE TO DRUG ABUSE, PT TOLD THIS YEARS AGO    Patient Active Problem List   Diagnosis Date Noted  . Acute pain of left shoulder 01/23/2017  . 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 Distal radius fracture 03/29/2016    Past Surgical History:  Procedure Laterality Date  . CHOLECYSTECTOMY    . NASAL SEPTUM SURGERY  MARCH 2017  . ORIF WRIST FRACTURE Left 03/29/2016   Procedure: LEFT WRIST OSTEOTOMY WITH OPEN REDUCTION INTERNAL FIXATION (ORIF) ;  Surgeon: Kathryne Hitchhristopher Y Blackman, MD;  Location: [facility];  Service: Orthopedics;  Laterality: Left;  Left Intra-clavicular block  . TUBAL LIGATION    . WRIST SURGERY Right YEARS AGO   TENDON REPAIR    OB History    No data available       Home Medications    Prior to Admission medications   Medication  Sig Start Date End Date Taking? Authorizing Provider  ibuprofen (ADVIL,MOTRIN) 200 MG tablet Take 600 mg by mouth every 6 (six) hours as needed for headache or moderate pain.   Yes [provider]    Family History No family history on file.  Social History Social History  Substance Use Topics  . Smoking status: Current Every Day Smoker    Packs/day: 0.50    Years: 32.00    Types: Cigarettes  . Smokeless tobacco: Never Used     Comment: USING E CIGARETTE NOW  . Alcohol use No     Allergies   Other   Review of Systems Review of Systems  Constitutional: Negative for activity change, chills and fever.  HENT: Negative for tinnitus.   Eyes: Positive for photophobia.  Respiratory: Negative for shortness of breath.   Cardiovascular: Negative for chest pain.  Gastrointestinal: Positive for nausea. Negative for abdominal pain, diarrhea and vomiting.  Musculoskeletal: Negative for back pain, neck pain and neck stiffness.  Skin: Negative for rash and wound.  Neurological: Positive for headaches. Negative for dizziness, weakness, light-headedness and numbness.   Physical Exam Updated Vital Signs BP (!) 119/96   Pulse 76   Temp 97.8 F (36.6 C)   Resp 18   Ht 5\' 2"  (1.575 m)   Wt 49.9 kg (110 lb)   LMP 11/04/2010   SpO2 99%  BMI 20.12 kg/m   Physical Exam  Constitutional: She appears well-developed and well-nourished. No distress.  HENT:  Head: Normocephalic.  Right Ear: Tympanic membrane and external ear normal.  Left Ear: Tympanic membrane and external ear normal.  Nose: Nose normal. Right sinus exhibits no maxillary sinus tenderness and no frontal sinus tenderness. Left sinus exhibits no maxillary sinus tenderness and no frontal sinus tenderness.  Eyes: Conjunctivae and EOM are normal. Pupils are equal, round, and reactive to light.  Neck: Neck supple.  No meningeal signs.   Cardiovascular: Normal rate, regular rhythm, normal heart sounds and intact distal  pulses.  Exam reveals no gallop and no friction rub.   No murmur heard. Pulmonary/Chest: Effort normal. No respiratory distress. She has no wheezes. She has no rales.  Abdominal: Soft. Bowel sounds are normal. She exhibits no distension. There is no tenderness. There is no rebound and no guarding.  Musculoskeletal: Normal range of motion. She exhibits no edema, tenderness or deformity.  Neurological: She is alert.  Cranial nerves 2-12 intact. Finger-to-nose is normal. 5/5 motor strength of the bilateral upper and lower extremities. Moves all four extremities. Negative Romberg. Ambulatory without difficulty. NVI.   Skin: Skin is warm. Capillary refill takes less than 2 seconds. No rash noted. She is not diaphoretic.  Psychiatric: Her behavior is normal.  Nursing note and vitals reviewed.  ED Treatments / Results  Labs (all labs ordered are listed, but only abnormal results are displayed) Labs Reviewed - No data to display  EKG  EKG Interpretation None       Radiology No results found.  Procedures Procedures (including critical care time)  Medications Ordered in ED Medications  sodium chloride 0.9 % bolus 500 mL (0 mLs Intravenous Stopped 05/10/17 1306)  ketorolac (TORADOL) 30 MG/ML injection 30 mg (30 mg Intravenous Given 05/10/17 1211)  diphenhydrAMINE (BENADRYL) injection 25 mg (25 mg Intravenous Given 05/10/17 1211)  metoCLOPramide (REGLAN) injection 10 mg (10 mg Intravenous Given 05/10/17 1211)     Initial Impression / Assessment and Plan / ED Course  I have reviewed the triage vital signs and the nursing notes.  Pertinent labs & imaging results that were available during my care of the patient were reviewed by me and considered in my medical decision making (see chart for details).     Pt HA treated and improved while in ED with migraine cocktail and IVF.  Presentation is like pts typical HA and non concerning for [facility], ICH, Meningitis, or temporal arteritis. Pt is afebrile  with no focal neuro deficits, nuchal rigidity, or change in vision. Pt is to follow up with PCP to discuss prophylactic medication. Pt verbalizes understanding and is agreeable with plan to dc.   Final Clinical Impressions(s) / ED Diagnoses   Final diagnoses:  Migraine without aura and without status migrainosus, not intractable    New Prescriptions Discharge Medication List as of 05/10/2017 12:56 PM       Frederik Pear A, PA-C 05/12/17 1436    Cardama, Amadeo Garnet, MD 05/15/17 0004

## 2017-05-16 ENCOUNTER — Telehealth (INDEPENDENT_AMBULATORY_CARE_PROVIDER_SITE_OTHER): Payer: Self-pay

## 2017-05-16 NOTE — Telephone Encounter (Signed)
Patient is calling stating that she is having swelling in her left wrist,  Recently went to work.  Would like to know if something can be prescribed for inflammation?  CB# is [phone]  Please advise.  Thank You

## 2017-05-19 MED ORDER — NAPROXEN 500 MG PO TABS: ORAL_TABLET | ORAL | 1 refills | Status: DC

## 2017-05-19 NOTE — Addendum Note (Signed)
Addended byPrescott Parma: Evan Mackie on: 05/19/2017 08:57 AM   Modules accepted: Orders

## 2017-05-19 NOTE — Telephone Encounter (Signed)
Naproxen 500 mg twice daily as needed, #60, 1 refill

## 2017-05-19 NOTE — Telephone Encounter (Signed)
Please advise. Thanks.  

## 2017-05-19 NOTE — Telephone Encounter (Signed)
Submitted. Patient aware

## 2017-12-18 ENCOUNTER — Encounter (HOSPITAL_COMMUNITY): Payer: Self-pay | Admitting: Emergency Medicine

## 2017-12-18 DIAGNOSIS — R300 Dysuria: Secondary | ICD-10-CM | POA: Insufficient documentation

## 2017-12-18 DIAGNOSIS — Z5321 Procedure and treatment not carried out due to patient leaving prior to being seen by health care provider: Secondary | ICD-10-CM | POA: Insufficient documentation

## 2017-12-18 LAB — URINALYSIS, ROUTINE W REFLEX MICROSCOPIC
Bilirubin Urine: NEGATIVE
Glucose, UA: NEGATIVE mg/dL
Ketones, ur: NEGATIVE mg/dL
Nitrite: NEGATIVE
PH: 6 (ref 5.0–8.0)
Protein, ur: NEGATIVE mg/dL
SPECIFIC GRAVITY, URINE: 1.004 — AB (ref 1.005–1.030)

## 2017-12-18 NOTE — ED Triage Notes (Signed)
C/o painful with urination and urinary frequency all week. Reports PCP cancelled her appt for tomorrow due to all providers being out with the flu.

## 2017-12-19 ENCOUNTER — Emergency Department (HOSPITAL_COMMUNITY)
Admission: EM | Admit: 2017-12-19 | Discharge: 2017-12-19 | Disposition: A | Discharge status: Home / Self Care | Payer: No Typology Code available for payment source | Attending: Emergency Medicine | Admitting: Emergency Medicine

## 2017-12-19 ENCOUNTER — Emergency Department (HOSPITAL_COMMUNITY): Payer: Self-pay

## 2017-12-19 ENCOUNTER — Encounter (HOSPITAL_COMMUNITY): Payer: Self-pay | Admitting: Emergency Medicine

## 2017-12-19 ENCOUNTER — Inpatient Hospital Stay (HOSPITAL_COMMUNITY)
Admission: EM | Admit: 2017-12-19 | Discharge: 2017-12-22 | DRG: 690 | Disposition: A | Discharge status: Home / Self Care | Payer: No Typology Code available for payment source | Attending: Family Medicine | Admitting: Family Medicine

## 2017-12-19 DIAGNOSIS — I959 Hypotension, unspecified: Secondary | ICD-10-CM | POA: Diagnosis present

## 2017-12-19 DIAGNOSIS — N760 Acute vaginitis: Secondary | ICD-10-CM | POA: Diagnosis present

## 2017-12-19 DIAGNOSIS — D696 Thrombocytopenia, unspecified: Secondary | ICD-10-CM | POA: Diagnosis present

## 2017-12-19 DIAGNOSIS — F1910 Other psychoactive substance abuse, uncomplicated: Secondary | ICD-10-CM | POA: Diagnosis present

## 2017-12-19 DIAGNOSIS — N10 Acute tubulo-interstitial nephritis: Principal | ICD-10-CM | POA: Diagnosis present

## 2017-12-19 DIAGNOSIS — B9689 Other specified bacterial agents as the cause of diseases classified elsewhere: Secondary | ICD-10-CM | POA: Diagnosis present

## 2017-12-19 DIAGNOSIS — N12 Tubulo-interstitial nephritis, not specified as acute or chronic: Secondary | ICD-10-CM

## 2017-12-19 DIAGNOSIS — F17290 Nicotine dependence, other tobacco product, uncomplicated: Secondary | ICD-10-CM | POA: Diagnosis present

## 2017-12-19 DIAGNOSIS — N289 Disorder of kidney and ureter, unspecified: Secondary | ICD-10-CM | POA: Diagnosis present

## 2017-12-19 DIAGNOSIS — Z9049 Acquired absence of other specified parts of digestive tract: Secondary | ICD-10-CM

## 2017-12-19 LAB — RAPID URINE DRUG SCREEN, HOSP PERFORMED
AMPHETAMINES: NOT DETECTED
Barbiturates: NOT DETECTED
Benzodiazepines: NOT DETECTED
Cocaine: NOT DETECTED
Opiates: NOT DETECTED
Tetrahydrocannabinol: NOT DETECTED

## 2017-12-19 LAB — CBC
HEMATOCRIT: 38.2 % (ref 36.0–46.0)
Hemoglobin: 13.2 g/dL (ref 12.0–15.0)
MCH: 31.7 pg (ref 26.0–34.0)
MCHC: 34.6 g/dL (ref 30.0–36.0)
MCV: 91.8 fL (ref 78.0–100.0)
Platelets: 117 10*3/uL — ABNORMAL LOW (ref 150–400)
RBC: 4.16 MIL/uL (ref 3.87–5.11)
RDW: 12.6 % (ref 11.5–15.5)
WBC: 9.9 10*3/uL (ref 4.0–10.5)

## 2017-12-19 LAB — HEPATIC FUNCTION PANEL
ALBUMIN: 4 g/dL (ref 3.5–5.0)
ALT: 15 U/L (ref 14–54)
AST: 22 U/L (ref 15–41)
Alkaline Phosphatase: 94 U/L (ref 38–126)
BILIRUBIN DIRECT: 0.2 mg/dL (ref 0.1–0.5)
BILIRUBIN TOTAL: 0.8 mg/dL (ref 0.3–1.2)
Indirect Bilirubin: 0.6 mg/dL (ref 0.3–0.9)
Total Protein: 7.1 g/dL (ref 6.5–8.1)

## 2017-12-19 LAB — BASIC METABOLIC PANEL
ANION GAP: 10 (ref 5–15)
BUN: 15 mg/dL (ref 6–20)
CHLORIDE: 104 mmol/L (ref 101–111)
CO2: 21 mmol/L — ABNORMAL LOW (ref 22–32)
Calcium: 9.2 mg/dL (ref 8.9–10.3)
Creatinine, Ser: 1.02 mg/dL — ABNORMAL HIGH (ref 0.44–1.00)
GFR calc non Af Amer: >60 mL/min (ref >60)
Glucose, Bld: 132 mg/dL — ABNORMAL HIGH (ref 65–99)
POTASSIUM: 3.7 mmol/L (ref 3.5–5.1)
SODIUM: 135 mmol/L (ref 135–145)

## 2017-12-19 LAB — URINALYSIS, ROUTINE W REFLEX MICROSCOPIC
Bilirubin Urine: NEGATIVE
GLUCOSE, UA: NEGATIVE mg/dL
Ketones, ur: 20 mg/dL — AB
Nitrite: NEGATIVE
PH: 6 (ref 5.0–8.0)
Protein, ur: 30 mg/dL — AB
SPECIFIC GRAVITY, URINE: 1.011 (ref 1.005–1.030)

## 2017-12-19 LAB — WET PREP, GENITAL
SPERM: NONE SEEN
TRICH WET PREP: NONE SEEN
YEAST WET PREP: NONE SEEN

## 2017-12-19 LAB — LIPASE, BLOOD: Lipase: 20 U/L (ref 11–51)

## 2017-12-19 LAB — I-STAT BETA HCG BLOOD, ED (MC, WL, AP ONLY): I-stat hCG, quantitative: 11.1 m[IU]/mL — ABNORMAL HIGH (ref <5)

## 2017-12-19 MED ORDER — CEFTRIAXONE SODIUM 1 G IJ SOLR
1.0000 g | Freq: Once | INTRAMUSCULAR | Status: AC
  Administered 2017-12-19: 1 g via INTRAVENOUS
  Filled 2017-12-19: qty 10

## 2017-12-19 MED ORDER — ONDANSETRON HCL 4 MG/2ML IJ SOLN
4.0000 mg | Freq: Once | INTRAMUSCULAR | Status: AC
  Administered 2017-12-19: 4 mg via INTRAVENOUS
  Filled 2017-12-19: qty 2

## 2017-12-19 MED ORDER — ACETAMINOPHEN 500 MG PO TABS
1000.0000 mg | ORAL_TABLET | Freq: Once | ORAL | Status: DC
  Filled 2017-12-19: qty 2

## 2017-12-19 MED ORDER — IOPAMIDOL (ISOVUE-300) INJECTION 61%
INTRAVENOUS | Status: AC
  Filled 2017-12-19: qty 30

## 2017-12-19 MED ORDER — SODIUM CHLORIDE 0.9 % IV BOLUS (SEPSIS)
1000.0000 mL | Freq: Once | INTRAVENOUS | Status: AC
Start: 2017-12-19 — End: 2017-12-19
  Administered 2017-12-19: 1000 mL via INTRAVENOUS

## 2017-12-19 MED ORDER — IBUPROFEN 800 MG PO TABS
800.0000 mg | ORAL_TABLET | Freq: Once | ORAL | Status: AC
  Administered 2017-12-19: 800 mg via ORAL
  Filled 2017-12-19: qty 1

## 2017-12-19 MED ORDER — IOPAMIDOL (ISOVUE-300) INJECTION 61%
15.0000 mL | Freq: Once | INTRAVENOUS | Status: AC | PRN
  Administered 2017-12-19: 15 mL via ORAL

## 2017-12-19 MED ORDER — SODIUM CHLORIDE 0.9 % IV BOLUS (SEPSIS)
1000.0000 mL | Freq: Once | INTRAVENOUS | Status: AC
  Administered 2017-12-19: 1000 mL via INTRAVENOUS

## 2017-12-19 NOTE — ED Notes (Addendum)
Pt called for vs no response from the lobby

## 2017-12-19 NOTE — ED Notes (Signed)
Called lab to have UDS added, ran. Lab will call if there is not enough urine to run.

## 2017-12-19 NOTE — ED Provider Notes (Signed)
[facility]-EMERGENCY DEPT Provider Note   CSN: [id number] Arrival date & time: 12/19/17  1811     History   Chief Complaint Chief Complaint  Patient presents with  . Urinary Tract Infection  . Fever    HPI Regina Daniel is a 47 y.o. female.  HPI Regina Daniel is a 47 y.o. female with history of polysubstance abuse, presents to emergency department complaining of dysuria, diffuse abdominal pain, fever.  She states she has had symptoms for 5 days.  She states she went to urgent care today and was diagnosed with urinary tract infection and placed on Cipro.  She has had one tablets so far.  She states she felt like she was running high fever at home, reports pain was getting worse so she decided to come to emergency department.  She reports associated nausea, no vomiting.  Denies any diarrhea.  She states abdominal pain is diffuse and radiates into the lower back.  Denies associated vaginal discharge or bleeding but states "felt like I just passed a huge baby out of my vagina."  Patient states she was last sexually active 1 week ago.  States took Tylenol just prior to transport to emergency department by EMS which was about 3 hours ago from now.  States took 500 mg.  Denies any other treatment  Past Medical History:  Diagnosis Date  . Drug abuse (HCC)   . Left wrist fracture JAN 6.2017  . 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 Renal disorder    RIGHT KIDNEY WAS DETERORRITING DUE TO DRUG ABUSE, PT TOLD THIS YEARS AGO    Patient Active Problem List   Diagnosis Date Noted  . Acute pain of left shoulder 01/23/2017  . 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 Distal radius fracture 03/29/2016    Past Surgical History:  Procedure Laterality Date  . CHOLECYSTECTOMY    . NASAL SEPTUM SURGERY  MARCH 2017  . ORIF WRIST FRACTURE Left 03/29/2016   Procedure: LEFT  WRIST OSTEOTOMY WITH OPEN REDUCTION INTERNAL FIXATION (ORIF) ;  Surgeon: Kathryne Hitch, MD;  Location: [facility];  Service: Orthopedics;  Laterality: Left;  Left Intra-clavicular block  . TUBAL LIGATION    . WRIST SURGERY Right YEARS AGO   TENDON REPAIR    OB History    No data available       Home Medications    Prior to Admission medications   Medication Sig Start Date End Date Taking? Authorizing Provider  ibuprofen (ADVIL,MOTRIN) 200 MG tablet Take 600 mg by mouth every 6 (six) hours as needed for headache or moderate pain.    [provider]  naproxen (NAPROSYN) 500 MG tablet 1 po BID PRN 05/19/17   Kathryne Hitch, MD    Family History No family history on file.  Social History Social History   Tobacco Use  . Smoking status: Current Every Day Smoker    Packs/day: 0.50    Years: 32.00    Pack years: 16.00    Types: Cigarettes  . Smokeless tobacco: Never Used  . Tobacco comment: USING E CIGARETTE NOW  Substance Use Topics  . Alcohol use: No  . Drug use: No    Comment: last used 04-15-15     Allergies   Other   Review of Systems Review of Systems  Constitutional: Positive for chills and fever.  Respiratory:  Negative for cough, chest tightness and shortness of breath.   Cardiovascular: Negative for chest pain, palpitations and leg swelling.  Gastrointestinal: Positive for abdominal pain, nausea and vomiting. Negative for diarrhea.  Genitourinary: Positive for dysuria, frequency, pelvic pain and urgency. Negative for flank pain, hematuria, vaginal bleeding, vaginal discharge and vaginal pain.  Musculoskeletal: Positive for back pain. Negative for arthralgias, myalgias, neck pain and neck stiffness.  Skin: Negative for rash.  Neurological: Positive for headaches. Negative for dizziness and weakness.  All other systems reviewed and are negative.    Physical Exam Updated Vital Signs BP 106/63 (BP Location: Left Arm)   Pulse 67   Temp  98.6 F (37 C) (Oral)   Resp 16   Ht 5\' 2"  (1.575 m)   Wt 49.9 kg (110 lb)   LMP 11/04/2010   SpO2 98%   BMI 20.12 kg/m   Physical Exam  Constitutional: She is oriented to person, place, and time. She appears well-developed and well-nourished. No distress.  HENT:  Head: Normocephalic.  Eyes: Conjunctivae are normal.  Neck: Neck supple.  Cardiovascular: Normal rate, regular rhythm and normal heart sounds.  Pulmonary/Chest: Effort normal and breath sounds normal. No respiratory distress. She has no wheezes. She has no rales.  Abdominal: Soft. Bowel sounds are normal. She exhibits no distension. There is tenderness. There is no rebound.  Diffuse tenderness to palpation  Musculoskeletal: She exhibits no edema.  Neurological: She is alert and oriented to person, place, and time.  Skin: Skin is warm and dry.  Psychiatric: She has a normal mood and affect. Her behavior is normal.  Nursing note and vitals reviewed.    ED Treatments / Results  Labs (all labs ordered are listed, but only abnormal results are displayed) Labs Reviewed  URINALYSIS, ROUTINE W REFLEX MICROSCOPIC - Abnormal; Notable for the following components:      Result Value   APPearance CLOUDY (*)    Hgb urine dipstick MODERATE (*)    Ketones, ur 20 (*)    Protein, ur 30 (*)    Leukocytes, UA LARGE (*)    Bacteria, UA RARE (*)    Squamous Epithelial / LPF 0-5 (*)    All other components within normal limits  BASIC METABOLIC PANEL - Abnormal; Notable for the following components:   CO2 21 (*)    Glucose, Bld 132 (*)    Creatinine, Ser 1.02 (*)    All other components within normal limits  CBC - Abnormal; Notable for the following components:   Platelets 117 (*)    All other components within normal limits  I-STAT BETA HCG BLOOD, ED ([facility], [facility], [facility] ONLY) - Abnormal; Notable for the following components:   I-stat hCG, quantitative 11.1 (*)    All other components within normal limits  URINE CULTURE  WET PREP,  GENITAL  CULTURE, BLOOD (ROUTINE X 2)  CULTURE, BLOOD (ROUTINE X 2)  HEPATIC FUNCTION PANEL  LIPASE, BLOOD  GC/CHLAMYDIA PROBE AMP ([place]) NOT AT [facility]    EKG  EKG Interpretation None       Radiology No results found.  Procedures Procedures (including critical care time)  Medications Ordered in ED Medications  ibuprofen (ADVIL,MOTRIN) tablet 800 mg (not administered)  sodium chloride 0.9 % bolus 1,000 mL (not administered)  ondansetron (ZOFRAN) injection 4 mg (not administered)  cefTRIAXone (ROCEPHIN) 1 g in sodium chloride 0.9 % 100 mL IVPB (not administered)     Initial Impression / Assessment and Plan / ED Course  I  have reviewed the triage vital signs and the nursing notes.  Pertinent labs & imaging results that were available during my care of the patient were reviewed by me and considered in my medical decision making (see chart for details).     Patient in emergency department with diffuse abdominal pain, nausea, fever 103 upon presentation here.  Blood pressure is borderline of 90/59.  Given borderline blood pressure, fever, will obtain blood cultures, basic labs already ordered by RN upon triage, unremarkable.  She is not pregnant.  Urinalysis with moderate hemoglobin, large leukocytes, too numerous to count white blood cells, rare bacteria.  Will obtain blood cultures, go ahead and order antibiotics here, Rocephin ordered.  Will perform pelvic exam and add LFTs and lipase to her labs.  Will administer IV fluids and antiemetics.  12:40 AM I was told by RN that patient's blood pressure dropped into 80s systolic.  Patient is very small stature.  Her normal blood pressures are 90 systolic.  I will give her more IV fluids.  Her white blood cell count, lactic acid, and map pressure are normal.  She is mentating well.  CT scan of abdomen pelvis pending to rule out any other intra-abdominal pathology that would explain her symptoms.  She has received Rocephin and  currently receiving fluids.  Patient updated on plan.  Signed out at shift change pending results of the CT scan and IV fluid bolus.  Vitals:   12/19/17 1817 12/19/17 2103 12/19/17 2315  BP: (!) 99/59 106/63 (!) 86/54  Pulse: 97 67 61  Resp: 18 16 18   Temp: (!) 103 F (39.4 C) 98.6 F (37 C) 97.7 F (36.5 C)  TempSrc: Oral Oral Oral  SpO2: 100% 98% 100%  Weight: 49.9 kg (110 lb)    Height: 5\' 2"  (1.575 m)       Final Clinical Impressions(s) / ED Diagnoses   Final diagnoses:  None    ED Discharge Orders    None       Jaynie Crumble, PA-C 12/20/17 0041    Charlynne Pander, MD 12/22/17 [phone]

## 2017-12-19 NOTE — ED Triage Notes (Signed)
Patient here from home with complaints of UTI today. Fever. Seen by PCP today received diagnosis of UTI today prescribed CIPRO.

## 2017-12-19 NOTE — ED Triage Notes (Signed)
Called to take to room  No response from lobby  

## 2017-12-20 ENCOUNTER — Other Ambulatory Visit: Payer: Self-pay

## 2017-12-20 DIAGNOSIS — R509 Fever, unspecified: Secondary | ICD-10-CM

## 2017-12-20 DIAGNOSIS — D696 Thrombocytopenia, unspecified: Secondary | ICD-10-CM

## 2017-12-20 DIAGNOSIS — N12 Tubulo-interstitial nephritis, not specified as acute or chronic: Secondary | ICD-10-CM | POA: Diagnosis not present

## 2017-12-20 DIAGNOSIS — N760 Acute vaginitis: Secondary | ICD-10-CM

## 2017-12-20 DIAGNOSIS — I959 Hypotension, unspecified: Secondary | ICD-10-CM

## 2017-12-20 DIAGNOSIS — B9689 Other specified bacterial agents as the cause of diseases classified elsewhere: Secondary | ICD-10-CM

## 2017-12-20 LAB — I-STAT CG4 LACTIC ACID, ED
LACTIC ACID, VENOUS: 0.42 mmol/L — AB (ref 0.5–1.9)
LACTIC ACID, VENOUS: 0.46 mmol/L — AB (ref 0.5–1.9)

## 2017-12-20 LAB — BASIC METABOLIC PANEL
ANION GAP: 4 — AB (ref 5–15)
BUN: 12 mg/dL (ref 6–20)
CALCIUM: 7.4 mg/dL — AB (ref 8.9–10.3)
CO2: 21 mmol/L — ABNORMAL LOW (ref 22–32)
Chloride: 115 mmol/L — ABNORMAL HIGH (ref 101–111)
Creatinine, Ser: 0.77 mg/dL (ref 0.44–1.00)
Glucose, Bld: 94 mg/dL (ref 65–99)
Potassium: 3.5 mmol/L (ref 3.5–5.1)
SODIUM: 140 mmol/L (ref 135–145)

## 2017-12-20 LAB — CBC
HEMATOCRIT: 32.3 % — AB (ref 36.0–46.0)
Hemoglobin: 11.1 g/dL — ABNORMAL LOW (ref 12.0–15.0)
MCH: 32.3 pg (ref 26.0–34.0)
MCHC: 34.4 g/dL (ref 30.0–36.0)
MCV: 93.9 fL (ref 78.0–100.0)
PLATELETS: 80 10*3/uL — AB (ref 150–400)
RBC: 3.44 MIL/uL — ABNORMAL LOW (ref 3.87–5.11)
RDW: 12.8 % (ref 11.5–15.5)
WBC: 5.1 10*3/uL (ref 4.0–10.5)

## 2017-12-20 LAB — I-STAT TROPONIN, ED: TROPONIN I, POC: 0 ng/mL (ref 0.00–0.08)

## 2017-12-20 LAB — HIV ANTIBODY (ROUTINE TESTING W REFLEX): HIV SCREEN 4TH GENERATION: NONREACTIVE

## 2017-12-20 MED ORDER — SODIUM CHLORIDE 0.9 % IV BOLUS (SEPSIS)
1000.0000 mL | Freq: Once | INTRAVENOUS | Status: AC
  Administered 2017-12-20: 1000 mL via INTRAVENOUS

## 2017-12-20 MED ORDER — ACETAMINOPHEN 650 MG RE SUPP: 650.0000 mg | Freq: Four times a day (QID) | RECTAL | Status: DC | PRN

## 2017-12-20 MED ORDER — ACETAMINOPHEN 325 MG PO TABS
650.0000 mg | ORAL_TABLET | Freq: Four times a day (QID) | ORAL | Status: DC | PRN
  Administered 2017-12-20 – 2017-12-22 (×5): 650 mg via ORAL
  Filled 2017-12-20 (×5): qty 2

## 2017-12-20 MED ORDER — SODIUM CHLORIDE 0.9 % IV SOLN
1.0000 g | INTRAVENOUS | Status: DC
  Administered 2017-12-20 – 2017-12-21 (×2): 1 g via INTRAVENOUS
  Filled 2017-12-20: qty 10
  Filled 2017-12-20: qty 1
  Filled 2017-12-20: qty 10

## 2017-12-20 MED ORDER — ENOXAPARIN SODIUM 40 MG/0.4ML ~~LOC~~ SOLN
40.0000 mg | SUBCUTANEOUS | Status: DC
  Administered 2017-12-20 – 2017-12-21 (×2): 40 mg via SUBCUTANEOUS
  Filled 2017-12-20 (×3): qty 0.4

## 2017-12-20 MED ORDER — ONDANSETRON HCL 4 MG PO TABS
4.0000 mg | ORAL_TABLET | Freq: Four times a day (QID) | ORAL | Status: DC | PRN
  Administered 2017-12-21: 4 mg via ORAL
  Filled 2017-12-20 (×2): qty 1

## 2017-12-20 MED ORDER — KETOROLAC TROMETHAMINE 30 MG/ML IJ SOLN
30.0000 mg | INTRAMUSCULAR | Status: AC
  Administered 2017-12-20: 30 mg via INTRAVENOUS
  Filled 2017-12-20: qty 1

## 2017-12-20 MED ORDER — SODIUM CHLORIDE 0.9 % IV SOLN
INTRAVENOUS | Status: DC
  Administered 2017-12-20 – 2017-12-22 (×5): via INTRAVENOUS

## 2017-12-20 MED ORDER — METRONIDAZOLE IN NACL 5-0.79 MG/ML-% IV SOLN
500.0000 mg | Freq: Three times a day (TID) | INTRAVENOUS | Status: DC
  Administered 2017-12-20 – 2017-12-22 (×7): 500 mg via INTRAVENOUS
  Filled 2017-12-20 (×9): qty 100

## 2017-12-20 MED ORDER — IOPAMIDOL (ISOVUE-300) INJECTION 61%
INTRAVENOUS | Status: AC
  Filled 2017-12-20: qty 100

## 2017-12-20 MED ORDER — ONDANSETRON HCL 4 MG/2ML IJ SOLN
4.0000 mg | Freq: Four times a day (QID) | INTRAMUSCULAR | Status: DC | PRN
  Administered 2017-12-20 – 2017-12-21 (×3): 4 mg via INTRAVENOUS
  Filled 2017-12-20 (×3): qty 2

## 2017-12-20 MED ORDER — IOPAMIDOL (ISOVUE-300) INJECTION 61%
100.0000 mL | Freq: Once | INTRAVENOUS | Status: AC | PRN
  Administered 2017-12-20: 100 mL via INTRAVENOUS

## 2017-12-20 MED ORDER — ALBUTEROL SULFATE (2.5 MG/3ML) 0.083% IN NEBU: 2.5000 mg | INHALATION_SOLUTION | Freq: Four times a day (QID) | RESPIRATORY_TRACT | Status: DC | PRN

## 2017-12-20 NOTE — ED Notes (Signed)
ED TO INPATIENT HANDOFF REPORT  Name/Age/Gender Regina Daniel 47 y.o. female  Code Status    Code Status Orders  (From admission, onward)        Start     Ordered   12/20/17 0523  Full code  Continuous     12/20/17 0525    Code Status History    Date Active Date Inactive Code Status Order ID Comments User Context   This patient has a current code status but no historical code status.      Home/SNF/Other Home  Chief Complaint UTI / fever   Level of Care/Admitting Diagnosis ED Disposition    ED Disposition Condition Comment   Admit  Hospital Area: [facility] [[id number]]  Level of Care: Telemetry [5]  Admit to tele based on following criteria: Complex arrhythmia (Bradycardia/Tachycardia)  Diagnosis: Pyelonephritis [962952]  Admitting Physician: Norval Morton [[id number]]  Attending Physician: Norval Morton [[id number]]  PT Class (Do Not Modify): Observation [104]  PT Acc Code (Do Not Modify): Observation [10022]       Medical History Past Medical History:  Diagnosis Date  . Drug abuse ([employer])   . Left wrist fracture JAN 6.2017  . 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 Renal disorder    RIGHT KIDNEY WAS DETERORRITING DUE TO DRUG ABUSE, PT TOLD THIS YEARS AGO    Allergies Allergies  Allergen Reactions  . Other Nausea And Vomiting and Other (See Comments)    2 antibiotics cause her to get sick. She cannot remember the name of them. Tried [facility] and they don't have any allergy information on file.     IV Location/Drains/Wounds Patient Lines/Drains/Airways Status   Active Line/Drains/Airways    Name:   Placement date:   Placement time:   Site:   Days:   Peripheral IV 12/19/17 Left Antecubital   12/19/17    2219    Antecubital   1          Labs/Imaging Results for orders placed or performed during the hospital encounter of 12/19/17 (from the past 48 hour(s))  Urinalysis,  Routine w reflex microscopic- may I&O cath if menses     Status: Abnormal   Collection Time: 12/19/17  7:01 PM  Result Value Ref Range   Color, Urine YELLOW YELLOW   APPearance CLOUDY (A) CLEAR   Specific Gravity, Urine 1.011 1.005 - 1.030   pH 6.0 5.0 - 8.0   Glucose, UA NEGATIVE NEGATIVE mg/dL   Hgb urine dipstick MODERATE (A) NEGATIVE   Bilirubin Urine NEGATIVE NEGATIVE   Ketones, ur 20 (A) NEGATIVE mg/dL   Protein, ur 30 (A) NEGATIVE mg/dL   Nitrite NEGATIVE NEGATIVE   Leukocytes, UA LARGE (A) NEGATIVE   RBC / HPF 6-30 0 - 5 RBC/hpf   WBC, UA TOO NUMEROUS TO COUNT 0 - 5 WBC/hpf   Bacteria, UA RARE (A) NONE SEEN   Squamous Epithelial / LPF 0-5 (A) NONE SEEN   Mucus PRESENT     Comment: Performed at [facility], [place] [address] [place], [place] [address]  Basic metabolic panel     Status: Abnormal   Collection Time: 12/19/17  7:11 PM  Result Value Ref Range   Sodium 135 135 - 145 mmol/L   Potassium 3.7 3.5 - 5.1 mmol/L   Chloride 104 101 - 111 mmol/L   CO2 21 (L) 22 - 32 mmol/L   Glucose, Bld  132 (H) 65 - 99 mg/dL   BUN 15 6 - 20 mg/dL   Creatinine, Ser 1.02 (H) 0.44 - 1.00 mg/dL   Calcium 9.2 8.9 - 10.3 mg/dL   GFR calc non Af Amer >60 >60 mL/min   GFR calc Af Amer >60 >60 mL/min    Comment: (NOTE) The eGFR has been calculated using the CKD EPI equation. This calculation has not been validated in all clinical situations. eGFR's persistently <60 mL/min signify possible Chronic Kidney Disease.    Anion gap 10 5 - 15    Comment: Performed at [facility], [place] [address] [place], [place] [address]  CBC     Status: Abnormal   Collection Time: 12/19/17  7:11 PM  Result Value Ref Range   WBC 9.9 4.0 - 10.5 K/uL   RBC 4.16 3.87 - 5.11 MIL/uL   Hemoglobin 13.2 12.0 - 15.0 g/dL   HCT 38.2 36.0 - 46.0 %   MCV 91.8 78.0 - 100.0 fL   MCH 31.7 26.0 - 34.0 pg   MCHC 34.6 30.0 - 36.0 g/dL   RDW 12.6 11.5 - 15.5 %   Platelets 117 (L) 150 -  400 K/uL    Comment: SPECIMEN CHECKED FOR CLOTS REPEATED TO VERIFY PLATELET COUNT CONFIRMED BY SMEAR Performed at [place] [address], [place], [place] [address]   I-Stat beta hCG blood, ED     Status: Abnormal   Collection Time: 12/19/17  7:24 PM  Result Value Ref Range   I-stat hCG, quantitative 11.1 (H) <5 mIU/mL   Comment 3            Comment:   GEST. AGE      CONC.  (mIU/mL)   <=1 WEEK        5 - 50     2 WEEKS       50 - 500     3 WEEKS       100 - 10,000     4 WEEKS     1,000 - 30,000        FEMALE AND NON-PREGNANT FEMALE:     LESS THAN 5 mIU/mL   Rapid urine drug screen (hospital performed)     Status: None   Collection Time: 12/19/17  9:22 PM  Result Value Ref Range   Opiates NONE DETECTED NONE DETECTED   Cocaine NONE DETECTED NONE DETECTED   Benzodiazepines NONE DETECTED NONE DETECTED   Amphetamines NONE DETECTED NONE DETECTED   Tetrahydrocannabinol NONE DETECTED NONE DETECTED   Barbiturates NONE DETECTED NONE DETECTED    Comment: (NOTE) DRUG SCREEN FOR MEDICAL PURPOSES ONLY.  IF CONFIRMATION IS NEEDED FOR ANY PURPOSE, NOTIFY LAB WITHIN 5 DAYS. LOWEST DETECTABLE LIMITS FOR URINE DRUG SCREEN Drug Class                     Cutoff (ng/mL) Amphetamine and metabolites    1000 Barbiturate and metabolites    200 Benzodiazepine                 737 Tricyclics and metabolites     300 Opiates and metabolites        300 Cocaine and metabolites        300 THC                            50 Performed at [facility], [place] [address] [place],  [place] [address]   Hepatic function panel     Status: None   Collection Time: 12/19/17 10:03 PM  Result Value Ref Range   Total Protein 7.1 6.5 - 8.1 g/dL   Albumin 4.0 3.5 - 5.0 g/dL   AST 22 15 - 41 U/L   ALT 15 14 - 54 U/L   Alkaline Phosphatase 94 38 - 126 U/L   Total Bilirubin 0.8 0.3 - 1.2 mg/dL   Bilirubin, Direct 0.2 0.1 - 0.5 mg/dL   Indirect Bilirubin 0.6 0.3 - 0.9 mg/dL     Comment: Performed at [facility], [place] [address], [place], [place] [address]  Lipase, blood     Status: None   Collection Time: 12/19/17 10:03 PM  Result Value Ref Range   Lipase 20 11 - 51 U/L    Comment: Performed at [facility], [place] [address], [place], [place] [address]  Wet prep, genital     Status: Abnormal   Collection Time: 12/19/17 10:26 PM  Result Value Ref Range   Yeast Wet Prep HPF POC NONE SEEN NONE SEEN   Trich, Wet Prep NONE SEEN NONE SEEN   Clue Cells Wet Prep HPF POC PRESENT (A) NONE SEEN   WBC, Wet Prep HPF POC MODERATE (A) NONE SEEN   Sperm NONE SEEN     Comment: Performed at [facility], [place] [address] [place], [place] [address]  I-Stat CG4 Lactic Acid, ED     Status: Abnormal   Collection Time: 12/20/17 12:15 AM  Result Value Ref Range   Lactic Acid, Venous 0.46 (L) 0.5 - 1.9 mmol/L  I-Stat CG4 Lactic Acid, ED     Status: Abnormal   Collection Time: 12/20/17  2:32 AM  Result Value Ref Range   Lactic Acid, Venous 0.42 (L) 0.5 - 1.9 mmol/L  I-stat troponin, ED     Status: None   Collection Time: 12/20/17  5:22 AM  Result Value Ref Range   Troponin i, poc 0.00 0.00 - 0.08 ng/mL   Comment 3            Comment: Due to the release kinetics of cTnI, a negative result within the first hours of the onset of symptoms does not rule out myocardial infarction with certainty. If myocardial infarction is still suspected, repeat the test at appropriate intervals.   CBC     Status: Abnormal   Collection Time: 12/20/17  6:23 AM  Result Value Ref Range   WBC 5.1 4.0 - 10.5 K/uL   RBC 3.44 (L) 3.87 - 5.11 MIL/uL   Hemoglobin 11.1 (L) 12.0 - 15.0 g/dL   HCT 32.3 (L) 36.0 - 46.0 %   MCV 93.9 78.0 - 100.0 fL   MCH 32.3 26.0 - 34.0 pg   MCHC 34.4 30.0 - 36.0 g/dL   RDW 12.8 11.5 - 15.5 %   Platelets 80 (L) 150 - 400 K/uL    Comment: SPECIMEN CHECKED FOR CLOTS REPEATED TO VERIFY PLATELET COUNT CONFIRMED BY  SMEAR Performed at [facility] [address], [place], [place] [address]   Basic metabolic panel     Status: Abnormal   Collection Time: 12/20/17  6:23 AM  Result Value Ref Range   Sodium 140 135 - 145 mmol/L   Potassium 3.5 3.5 - 5.1 mmol/L   Chloride 115 (H) 101 - 111 mmol/L   CO2 21 (L) 22 - 32 mmol/L   Glucose, Bld 94 65 - 99 mg/dL  BUN 12 6 - 20 mg/dL   Creatinine, Ser 0.77 0.44 - 1.00 mg/dL   Calcium 7.4 (L) 8.9 - 10.3 mg/dL   GFR calc non Af Amer >60 >60 mL/min   GFR calc Af Amer >60 >60 mL/min    Comment: (NOTE) The eGFR has been calculated using the CKD EPI equation. This calculation has not been validated in all clinical situations. eGFR's persistently <60 mL/min signify possible Chronic Kidney Disease.    Anion gap 4 (L) 5 - 15    Comment: Performed at [facility], [place] [address], [place], [place] [address]   Ct Abdomen Pelvis W Contrast  Result Date: 12/20/2017 CLINICAL DATA:  Acute abdominal pain. Fever. Patient reports urinary frequency and painful urination. EXAM: CT ABDOMEN AND PELVIS WITH CONTRAST TECHNIQUE: Multidetector CT imaging of the abdomen and pelvis was performed using the standard protocol following bolus administration of intravenous contrast. CONTRAST:  168m ISOVUE-300 IOPAMIDOL (ISOVUE-300) INJECTION 61% COMPARISON:  CT 09/22/2014 FINDINGS: Lower chest: No pleural fluid or consolidation. Mild hypoventilatory changes. Hepatobiliary: No focal liver abnormality is seen. Status post cholecystectomy. No biliary dilatation. Pancreas: No ductal dilatation or inflammation. Spleen: Normal in size without focal abnormality. Adrenals/Urinary Tract: No adrenal nodule. Patchy heterogeneous left renal enhancement with enhancement of the renal pelvis and ureter consistent with pyelonephritis. No intrarenal or perirenal fluid collection. Probable extrarenal pelvis configuration of the right kidney, unchanged. Cortical scarring in  the upper right kidney. Homogeneous right renal enhancement. Mild enhancement of the right ureter. Urinary bladder is partially distended. No definite wall thickening. Stomach/Bowel: Lack of enteric contrast and paucity of intra-abdominal fat limits bowel assessment. Stomach is nondistended. No bowel wall thickening, obstruction or inflammation. Small to moderate colonic stool burden. Normal appendix, for example image 28 series 5. Vascular/Lymphatic: Normal caliber abdominal aorta. Retroaortic left renal vein. Hepatic artery and splenic artery arises separately from the aorta, normal variant. 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 Dilatation of the left ovarian vein. No enlarged abdominal or pelvic lymph nodes. Reproductive: Prominent adnexal vascularity and dilatation of the left ovarian vein, maximal dimension 7 mm. No adnexal mass. Other: No free air, free fluid, or intra-abdominal fluid collection. Musculoskeletal: Curvature of the spine may be positional or scoliosis. There are no acute or suspicious osseous abnormalities. IMPRESSION: 1. Findings consistent with left pyelonephritis. Possible right ureteric enhancement may reflect ascending urinary tract infection on the right as well. No renal abscess. 2. Prominent adnexal vascularity and dilatation of the left ovarian vein, can be seen with pelvic congestion syndrome in the appropriate clinical setting. Electronically Signed   By: MJeb LeveringM.D.   On: 12/20/2017 00:51    Pending Labs Unresulted Labs (From admission, onward)   Start     Ordered   12/20/17 0525  HIV antibody (Routine Testing)  Once,   R     12/20/17 0525   12/19/17 2117  Blood culture (routine x 2)  BLOOD CULTURE X 2,   STAT     12/19/17 2116   12/19/17 1821  Urine C&S  Once,   STAT     12/19/17 1820      Vitals/Pain Today's Vitals   12/20/17 1300 12/20/17 1344 12/20/17 1401 12/20/17 1402  BP: (!) 86/51  107/71   Pulse: 63  93   Resp: 19  20    Temp:      TempSrc:      SpO2: 100%   99%  Weight:  Height:      PainSc:  10-Worst pain ever      Isolation Precautions No active isolations  Medications Medications  metroNIDAZOLE (FLAGYL) IVPB 500 mg (0 mg Intravenous Stopped 12/20/17 1450)  enoxaparin (LOVENOX) injection 40 mg (40 mg Subcutaneous Given 12/20/17 0912)  0.9 %  sodium chloride infusion ( Intravenous New Bag/Given 12/20/17 0628)  ondansetron (ZOFRAN) tablet 4 mg (not administered)    Or  ondansetron (ZOFRAN) injection 4 mg (not administered)  acetaminophen (TYLENOL) tablet 650 mg (not administered)    Or  acetaminophen (TYLENOL) suppository 650 mg (not administered)  albuterol (PROVENTIL) (2.5 MG/3ML) 0.083% nebulizer solution 2.5 mg (not administered)  cefTRIAXone (ROCEPHIN) 1 g in sodium chloride 0.9 % 100 mL IVPB (not administered)  ibuprofen (ADVIL,MOTRIN) tablet 800 mg (800 mg Oral Given 12/19/17 2201)  sodium chloride 0.9 % bolus 1,000 mL (0 mLs Intravenous Stopped 12/19/17 2311)  ondansetron (ZOFRAN) injection 4 mg (4 mg Intravenous Given 12/19/17 2225)  cefTRIAXone (ROCEPHIN) 1 g in sodium chloride 0.9 % 100 mL IVPB (0 g Intravenous Stopped 12/19/17 2259)  sodium chloride 0.9 % bolus 1,000 mL (0 mLs Intravenous Stopped 12/20/17 0035)  iopamidol (ISOVUE-300) 61 % injection 15 mL (15 mLs Oral Contrast Given 12/19/17 2345)  sodium chloride 0.9 % bolus 1,000 mL (0 mLs Intravenous Stopped 12/20/17 0203)  iopamidol (ISOVUE-300) 61 % injection 100 mL (100 mLs Intravenous Contrast Given 12/20/17 0026)  sodium chloride 0.9 % bolus 1,000 mL (0 mLs Intravenous Stopped 12/20/17 0356)  ketorolac (TORADOL) 30 MG/ML injection 30 mg (30 mg Intravenous Given 12/20/17 0645)    Mobility walks

## 2017-12-20 NOTE — Progress Notes (Addendum)
Patient seen and evaluated earlier this am by my associate. On metronidazole will add Rocephin. No new complaints reported by patient on exam.  Will plan on reassessing next am.  Penny Piarlando Regina Daniel  Addendum:  Gen: pt in nad CV: no cyanosis Pulm: no increased wob, no wheezes

## 2017-12-20 NOTE — ED Notes (Signed)
Patient transported to CT 

## 2017-12-20 NOTE — ED Provider Notes (Signed)
Assumed care from PA Kirichenko.  See prior notes for full H&P.  Briefly, 47 year old female presenting to the ED with dysuria, abdominal pain, and fever for the past 5 days.  She was started on Cipro today and has had 1 dose thus far.  Labs overall reassuring, normal lactate and white blood cell count.  UA does appear infectious.  She received IV Rocephin.  Has been somewhat hypotensive here-- baseline BP is in the 90's systolic per patient.  Plan:  CT pending.  Getting additional IVFB.  Reassess hypotension and dispo accordingly.  Results for orders placed or performed during the hospital encounter of 12/19/17  Wet prep, genital  Result Value Ref Range   Yeast Wet Prep HPF POC NONE SEEN NONE SEEN   Trich, Wet Prep NONE SEEN NONE SEEN   Clue Cells Wet Prep HPF POC PRESENT (A) NONE SEEN   WBC, Wet Prep HPF POC MODERATE (A) NONE SEEN   Sperm NONE SEEN   Urinalysis, Routine w reflex microscopic- may I&O cath if menses  Result Value Ref Range   Color, Urine YELLOW YELLOW   APPearance CLOUDY (A) CLEAR   Specific Gravity, Urine 1.011 1.005 - 1.030   pH 6.0 5.0 - 8.0   Glucose, UA NEGATIVE NEGATIVE mg/dL   Hgb urine dipstick MODERATE (A) NEGATIVE   Bilirubin Urine NEGATIVE NEGATIVE   Ketones, ur 20 (A) NEGATIVE mg/dL   Protein, ur 30 (A) NEGATIVE mg/dL   Nitrite NEGATIVE NEGATIVE   Leukocytes, UA LARGE (A) NEGATIVE   RBC / HPF 6-30 0 - 5 RBC/hpf   WBC, UA TOO NUMEROUS TO COUNT 0 - 5 WBC/hpf   Bacteria, UA RARE (A) NONE SEEN   Squamous Epithelial / LPF 0-5 (A) NONE SEEN   Mucus PRESENT   Basic metabolic panel  Result Value Ref Range   Sodium 135 135 - 145 mmol/L   Potassium 3.7 3.5 - 5.1 mmol/L   Chloride 104 101 - 111 mmol/L   CO2 21 (L) 22 - 32 mmol/L   Glucose, Bld 132 (H) 65 - 99 mg/dL   BUN 15 6 - 20 mg/dL   Creatinine, Ser 1.611.02 (H) 0.44 - 1.00 mg/dL   Calcium 9.2 8.9 - 09.610.3 mg/dL   GFR calc non Af Amer >60 >60 mL/min   GFR calc Af Amer >60 >60 mL/min   Anion gap 10 5 - 15   CBC  Result Value Ref Range   WBC 9.9 4.0 - 10.5 K/uL   RBC 4.16 3.87 - 5.11 MIL/uL   Hemoglobin 13.2 12.0 - 15.0 g/dL   HCT 04.538.2 40.936.0 - 81.146.0 %   MCV 91.8 78.0 - 100.0 fL   MCH 31.7 26.0 - 34.0 pg   MCHC 34.6 30.0 - 36.0 g/dL   RDW 91.412.6 78.211.5 - 95.615.5 %   Platelets 117 (L) 150 - 400 K/uL  Hepatic function panel  Result Value Ref Range   Total Protein 7.1 6.5 - 8.1 g/dL   Albumin 4.0 3.5 - 5.0 g/dL   AST 22 15 - 41 U/L   ALT 15 14 - 54 U/L   Alkaline Phosphatase 94 38 - 126 U/L   Total Bilirubin 0.8 0.3 - 1.2 mg/dL   Bilirubin, Direct 0.2 0.1 - 0.5 mg/dL   Indirect Bilirubin 0.6 0.3 - 0.9 mg/dL  Lipase, blood  Result Value Ref Range   Lipase 20 11 - 51 U/L  Rapid urine drug screen (hospital performed)  Result Value Ref Range   Opiates  NONE DETECTED NONE DETECTED   Cocaine NONE DETECTED NONE DETECTED   Benzodiazepines NONE DETECTED NONE DETECTED   Amphetamines NONE DETECTED NONE DETECTED   Tetrahydrocannabinol NONE DETECTED NONE DETECTED   Barbiturates NONE DETECTED NONE DETECTED  I-Stat beta hCG blood, ED  Result Value Ref Range   I-stat hCG, quantitative 11.1 (H) <5 mIU/mL   Comment 3          I-Stat CG4 Lactic Acid, ED  Result Value Ref Range   Lactic Acid, Venous 0.46 (L) 0.5 - 1.9 mmol/L  I-Stat CG4 Lactic Acid, ED  Result Value Ref Range   Lactic Acid, Venous 0.42 (L) 0.5 - 1.9 mmol/L  I-stat troponin, ED  Result Value Ref Range   Troponin i, poc 0.00 0.00 - 0.08 ng/mL   Comment 3           Ct Abdomen Pelvis W Contrast  Result Date: 12/20/2017 CLINICAL DATA:  Acute abdominal pain. Fever. Patient reports urinary frequency and painful urination. EXAM: CT ABDOMEN AND PELVIS WITH CONTRAST TECHNIQUE: Multidetector CT imaging of the abdomen and pelvis was performed using the standard protocol following bolus administration of intravenous contrast. CONTRAST:  ISOVUE-300 IOPAMIDOL (ISOVUE-300) INJECTION 61% COMPARISON:  CT 09/22/2014 FINDINGS: Lower chest: No pleural  fluid or consolidation. Mild hypoventilatory changes. Hepatobiliary: No focal liver abnormality is seen. Status post cholecystectomy. No biliary dilatation. Pancreas: No ductal dilatation or inflammation. Spleen: Normal in size without focal abnormality. Adrenals/Urinary Tract: No adrenal nodule. Patchy heterogeneous left renal enhancement with enhancement of the renal pelvis and ureter consistent with pyelonephritis. No intrarenal or perirenal fluid collection. Probable extrarenal pelvis configuration of the right kidney, unchanged. Cortical scarring in the upper right kidney. Homogeneous right renal enhancement. Mild enhancement of the right ureter. Urinary bladder is partially distended. No definite wall thickening. Stomach/Bowel: Lack of enteric contrast and paucity of intra-abdominal fat limits bowel assessment. Stomach is nondistended. No bowel wall thickening, obstruction or inflammation. Small to moderate colonic stool burden. Normal appendix, for example image 28 series 5. Vascular/Lymphatic: Normal caliber abdominal aorta. Retroaortic left renal vein. Hepatic artery and splenic artery arises separately from the aorta, normal variant. 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 Dilatation of the left ovarian vein. No enlarged abdominal or pelvic lymph nodes. Reproductive: Prominent adnexal vascularity and dilatation of the left ovarian vein, maximal dimension 7 mm. No adnexal mass. Other: No free air, free fluid, or intra-abdominal fluid collection. Musculoskeletal: Curvature of the spine may be positional or scoliosis. There are no acute or suspicious osseous abnormalities. IMPRESSION: 1. Findings consistent with left pyelonephritis. Possible right ureteric enhancement may reflect ascending urinary tract infection on the right as well. No renal abscess. 2. Prominent adnexal vascularity and dilatation of the left ovarian vein, can be seen with pelvic congestion syndrome in the appropriate  clinical setting. Electronically Signed   By: Rubye Oaks M.D.   On: 12/20/2017 00:51    2:07 AM Patient has received her third liter folacin manual blood pressure in the 80s systolic.  Her baseline is in the low 90s systolic.  She did get up and walk to the bathroom but states she felt okay when doing so.  Her CT does reveal pyelonephritis.  She already received IV Rocephin here.  Will recheck lactate, give additional fluids.  Patient likely will need admission if she remains hypotensive.  4:21 AM Patient has had additional liter, mild improvement of BP, still in the upper 80's.  Repeat lactic acid still remains  WNL.  She has gotten up and went to the bathroom again, this time a little dizziness/lightheadedness with standing.  At this point given she has not had any significant improvement of her BP, feel she would benefit from admission.  Discussed with Dr. Katrinka Blazing-- requested troponin and EKG given persistent hypotension.  Will admit for ongoing care.   Garlon Hatchet, PA-C 12/20/17 2956    Ward, Layla Maw, DO 12/20/17 [phone]

## 2017-12-20 NOTE — ED Notes (Signed)
Pt ambulated self to the bathroom. Unsteady gait at times while ambulating.

## 2017-12-20 NOTE — Progress Notes (Signed)
PHARMACY NOTE -  Rocephin for UTI/Pyelonephritis  Pharmacy has been consulted for dosing of Rocephin for UTI/Pyelonephritis.  Chart reviewed.  Rocephin 1gm IV q24h ordered and need for further dosage adjustment appears unlikely at present.    Will sign off at this time.  Please reconsult if a change in clinical status warrants re-evaluation of dosage.  Junita PushMichelle Atalaya Zappia, PharmD, BCPS 12/20/2017@10 :17 AM

## 2017-12-20 NOTE — H&P (Signed)
History and Physical    Regina Daniel JXB:[id number] DOB: [birth year] DOA: 12/19/2017  Referring MD/NP/PA: Ileene Hutchinson, PA-C PCP: [facility]  Patient coming from: home  Chief Complaint: Abdominal pain  I have personally briefly reviewed patient's old medical records in [facility]   HPI: Regina Daniel is a 47 y.o. female with medical history significant of polysubstance abuse; who presents with complaints of diffuse abdominal pain and pressure with urinating over the last week.  Notes pain radiating to her back with generalized body aches.  Was initially seen at urgent care yesterday, and started on ciprofloxacin after diagnosed with a urinary tract infection.  She took 1 tablet at home, but having subjective fevers, nausea, chills, and worsening abdominal pain.  Other associated symptoms include some mild chest pain.  She tried taking Tylenol to relieve fever symptoms.  Denies having any significant diarrhea, vaginal discharge, loss of consciousness, or vomiting.  ED Course: Upon admission to the emergency department patient was seen to be febrile up to 103 F, blood pressures as low as 72/40-106/63.  Labs revealed WBC 9.9, platelets 117, BUN 15, and creatinine 1.02.  UDS negative.  Urinalysis was positive for signs of infection.  Wet prep was positive for clue cells and WBCs.  CT scan of the abdomen revealed signs upon otitis patient received 4 L of normal saline and empiric antibiotics of Rocephin.  TRH called to admit.  Review of Systems  Constitutional: Positive for chills, fever and malaise/fatigue.  HENT: Negative for congestion and ear discharge.   Eyes: Negative for photophobia and pain.  Respiratory: Negative for cough and shortness of breath.   Cardiovascular: Positive for chest pain.  Gastrointestinal: Positive for abdominal pain and nausea. Negative for constipation, diarrhea and vomiting.  Genitourinary: Positive for flank pain and urgency.    Musculoskeletal: Positive for back pain.  Skin: Negative for itching and rash.  Neurological: Negative for seizures and loss of consciousness.  Psychiatric/Behavioral: Negative for suicidal ideas. The patient is not nervous/anxious.     Past Medical History:  Diagnosis Date  . Drug abuse (HCC)   . Left wrist fracture JAN 6.2017  . 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 CHOLECYSTECTOMY    . NASAL SEPTUM SURGERY  MARCH 2017  . ORIF WRIST FRACTURE Left 03/29/2016   Procedure: LEFT WRIST OSTEOTOMY WITH OPEN REDUCTION INTERNAL FIXATION (ORIF) ;  Surgeon: Kathryne Hitch, MD;  Location: [facility];  Service: Orthopedics;  Laterality: Left;  Left Intra-clavicular block  . TUBAL LIGATION    . WRIST SURGERY Right YEARS AGO   TENDON REPAIR     reports that she has been smoking cigarettes.  She has a 16.00 pack-year smoking history. she has never used smokeless tobacco. She reports that she does not drink alcohol or use drugs.  Allergies  Allergen Reactions  . Other Nausea And Vomiting and Other (See Comments)    2 antibiotics cause her to get sick. She cannot remember the name of them. Tried [facility] and they don't have any allergy information on file.     No family history on file.  Prior to Admission medications   Medication Sig Start Date End Date Taking? Authorizing Provider  ciprofloxacin (CIPRO) 500 MG tablet Take  500 mg by mouth 2 (two) times daily.   Yes [provider]  naproxen (NAPROSYN) 500 MG tablet 1 po BID PRN Patient not taking: Reported on 12/19/2017 05/19/17   [name], Christopher Y, MD    Physical Exam:  Constitutional: Thin sick appearing middle-aged female Vitals:   12/19/17 2315 12/20/17 0146 12/20/17 0202 12/20/17 0330  BP: (!) 86/54 (!) 72/40  (!) 80/48 (!) 89/56  Pulse: 61 (!) 56  81  Resp: 18 18    Temp: 97.7 F (36.5 C)     TempSrc: Oral     SpO2: 100% 94%  94%  Weight:      Height:       Eyes: PERRL, lids and conjunctivae normal ENMT: Mucous membranes are dry. Posterior pharynx clear of any exudate or lesions.   Neck: normal, supple, no masses, no thyromegaly Respiratory: clear to auscultation bilaterally, no wheezing, no crackles. Normal respiratory effort. No accessory muscle use.  Cardiovascular: Regular rate and rhythm, no murmurs / rubs / gallops. No extremity edema. 2+ pedal pulses. No carotid bruits.  Abdomen: Generalized lower abdominal tenderness with positive left CVA tenderness, no masses palpated. No hepatosplenomegaly. Bowel sounds positive.  Musculoskeletal: no clubbing / cyanosis. No joint deformity upper and lower extremities. Good ROM, no contractures. Normal muscle tone.  Skin: no rashes, lesions, ulcers. No induration Neurologic: CN 2-12 grossly intact. Sensation intact, DTR normal. Strength 5/5 in all 4.  Psychiatric: Normal judgment and insight. Alert and oriented x 3. Normal mood.     Labs on Admission: I have personally reviewed following labs and imaging studies  CBC: Recent Labs  Lab 12/19/17 1911  WBC 9.9  HGB 13.2  HCT 38.2  MCV 91.8  PLT 117*   Basic Metabolic Panel: Recent Labs  Lab 12/19/17 1911  NA 135  K 3.7  CL 104  CO2 21*  GLUCOSE 132*  BUN 15  CREATININE 1.02*  CALCIUM 9.2   GFR: Estimated Creatinine Clearance: 54.3 mL/min (A) (by C-G formula based on SCr of 1.02 mg/dL (H)). Liver Function Tests: Recent Labs  Lab 12/19/17 2203  AST 22  ALT 15  ALKPHOS 94  BILITOT 0.8  PROT 7.1  ALBUMIN 4.0   Recent Labs  Lab 12/19/17 2203  LIPASE 20   No results for input(s): AMMONIA in the last 168 hours. Coagulation Profile: No results for input(s): INR, PROTIME in the last 168 hours. Cardiac Enzymes: No results for input(s): CKTOTAL, CKMB, CKMBINDEX, TROPONINI  in the last 168 hours. BNP (last 3 results) No results for input(s): PROBNP in the last 8760 hours. HbA1C: No results for input(s): HGBA1C in the last 72 hours. CBG: No results for input(s): GLUCAP in the last 168 hours. Lipid Profile: No results for input(s): CHOL, HDL, LDLCALC, TRIG, CHOLHDL, LDLDIRECT in the last 72 hours. Thyroid Function Tests: No results for input(s): TSH, T4TOTAL, FREET4, T3FREE, THYROIDAB in the last 72 hours. Anemia Panel: No results for input(s): VITAMINB12, FOLATE, FERRITIN, TIBC, IRON, RETICCTPCT in the last 72 hours. Urine analysis:    Component Value Date/Time   COLORURINE YELLOW 12/19/2017 1901   APPEARANCEUR CLOUDY (A) 12/19/2017 1901   LABSPEC 1.011 12/19/2017 1901   PHURINE 6.0 12/19/2017 1901   GLUCOSEU NEGATIVE 12/19/2017 1901   HGBUR MODERATE (A) 12/19/2017 1901   BILIRUBINUR NEGATIVE 12/19/2017 1901   KETONESUR 20 (A) 12/19/2017 1901   PROTEINUR 30 (A) 12/19/2017 1901   UROBILINOGEN 1.0 11/23/2011 1905   NITRITE NEGATIVE 12/19/2017 1901   LEUKOCYTESUR LARGE (A)  12/19/2017 1901   Sepsis Labs: Recent Results (from the past 240 hour(s))  Wet prep, genital     Status: Abnormal   Collection Time: 12/19/17 10:26 PM  Result Value Ref Range Status   Yeast Wet Prep HPF POC NONE SEEN NONE SEEN Final   Trich, Wet Prep NONE SEEN NONE SEEN Final   Clue Cells Wet Prep HPF POC PRESENT (A) NONE SEEN Final   WBC, Wet Prep HPF POC MODERATE (A) NONE SEEN Final   Sperm NONE SEEN  Final    Comment: Performed at [facility], [address], [place], [place] [address]     Radiological Exams on Admission: Ct Abdomen Pelvis W Contrast  Result Date: 12/20/2017 CLINICAL DATA:  Acute abdominal pain. Fever. Patient reports urinary frequency and painful urination. EXAM: CT ABDOMEN AND PELVIS WITH CONTRAST TECHNIQUE: Multidetector CT imaging of the abdomen and pelvis was performed using the standard protocol following bolus administration of  intravenous contrast. CONTRAST:  ISOVUE-300 IOPAMIDOL (ISOVUE-300) INJECTION 61% COMPARISON:  CT 09/22/2014 FINDINGS: Lower chest: No pleural fluid or consolidation. Mild hypoventilatory changes. Hepatobiliary: No focal liver abnormality is seen. Status post cholecystectomy. No biliary dilatation. Pancreas: No ductal dilatation or inflammation. Spleen: Normal in size without focal abnormality. Adrenals/Urinary Tract: No adrenal nodule. Patchy heterogeneous left renal enhancement with enhancement of the renal pelvis and ureter consistent with pyelonephritis. No intrarenal or perirenal fluid collection. Probable extrarenal pelvis configuration of the right kidney, unchanged. Cortical scarring in the upper right kidney. Homogeneous right renal enhancement. Mild enhancement of the right ureter. Urinary bladder is partially distended. No definite wall thickening. Stomach/Bowel: Lack of enteric contrast and paucity of intra-abdominal fat limits bowel assessment. Stomach is nondistended. No bowel wall thickening, obstruction or inflammation. Small to moderate colonic stool burden. Normal appendix, for example image 28 series 5. Vascular/Lymphatic: Normal caliber abdominal aorta. Retroaortic left renal vein. Hepatic artery and splenic artery arises separately from the aorta, normal variant. 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 Dilatation of the left ovarian vein. No enlarged abdominal or pelvic lymph nodes. Reproductive: Prominent adnexal vascularity and dilatation of the left ovarian vein, maximal dimension 7 mm. No adnexal mass. Other: No free air, free fluid, or intra-abdominal fluid collection. Musculoskeletal: Curvature of the spine may be positional or scoliosis. There are no acute or suspicious osseous abnormalities. IMPRESSION: 1. Findings consistent with left pyelonephritis. Possible right ureteric enhancement may reflect ascending urinary tract infection on the right as well. No  renal abscess. 2. Prominent adnexal vascularity and dilatation of the left ovarian vein, can be seen with pelvic congestion syndrome in the appropriate clinical setting. Electronically Signed   By: Rubye Oaks M.D.   On: 12/20/2017 00:51    EKG: Independently reviewed.  Sinus rhythm at 64 bpm  Assessment/Plan Pyelonephritis: Acute.  Patient presents with complaints of lower abdominal pain.  Urinalysis positive for signs of infection.  CT scan shows signs of left-sided pyelonephritis. - Admit to telemetry bed - Follow-up blood and urine cultures and GC chlamydia probe - Monitor intake and output - Continue empiric antibiotics of Rocephin  Fever: Acute.  Patient's initial temperature noted to be elevated 103 F on admission -Tylenol prn fever  Bacterial vaginosis: Patient was noted to have positive clue cells on wet prep. - Metronidazole IV  Hypotension:Patient with blood pressures as low as 72/40 on admission, but reports systolics normally in the 90s.  Patient given IV fluids with some improvement MAP  - IV fluids  as tolerated  Thrombocytopenia: Acute.  Initial platelet count 117. - Recheck CBC in a.m.  DVT prophylaxis: lovenox Code Status: Full  Family Communication:  No family present at bedside Disposition Plan: TBD  Consults called: none Admission status: Observation  Clydie Braun MD [facility] Pager [phone]   If 7PM-7AM, please contact night-coverage [email or web address] Password [email or web address]  12/20/2017, 5:09 AM

## 2017-12-21 DIAGNOSIS — N12 Tubulo-interstitial nephritis, not specified as acute or chronic: Secondary | ICD-10-CM | POA: Diagnosis not present

## 2017-12-21 LAB — URINE CULTURE: CULTURE: NO GROWTH

## 2017-12-21 NOTE — Progress Notes (Signed)
PROGRESS NOTE    Regina Daniel  OZH:[id number] DOB: 06-23-1971 DOA: 12/19/2017 PCP: [facility]    Brief Narrative: 47 y.o. female with medical history significant of polysubstance abuse; who presents with complaints of diffuse abdominal pain and pressure with urinating over the last week.  Notes pain radiating to her back with generalized body aches.  Was initially seen at urgent care yesterday, and started on ciprofloxacin after diagnosed with a urinary tract infection.   Assessment & Plan:   Active Problems:   Pyelonephritis - most likely cause of fever and hypotension - Continue Rocephin and follow-up with blood cultures  Hypotension -Resolved most likely secondary to poor oral intake and principal problem list above. Patient given IV fluids  Bacterial vaginosis - Continue metronidazole  DVT prophylaxis: Lovenox Code Status: Full Family Communication: none at bedside Disposition Plan: pending improvement in condition   Consultants:   none   Procedures: none   Antimicrobials: rocephin, metronidazole   Subjective: Pt has no new complaints.   Objective: Vitals:   12/20/17 1951 12/20/17 2346 12/21/17 0504 12/21/17 1255  BP: 94/65 (!) 95/58 (!) 94/56 97/66  Pulse: (!) 56 (!) 59 (!) 51 (!) 58  Resp: 18 18 18 12   Temp: 99 F (37.2 C) 97.9 F (36.6 C) 98.5 F (36.9 C) 98.2 F (36.8 C)  TempSrc: Oral Oral Oral Oral  SpO2: 98% 98% 97% 100%  Weight:      Height:        Intake/Output Summary (Last 24 hours) at 12/21/2017 1713 Last data filed at 12/21/2017 1400 Gross per 24 hour  Intake 4223.33 ml  Output 0 ml  Net 4223.33 ml   Filed Weights   12/19/17 1817  Weight: 49.9 kg (110 lb)    Examination:  General exam: Appears calm and comfortable, in nad. Respiratory system: Clear to auscultation. Respiratory effort normal, equal chest rise.  Cardiovascular system: S1 & S2 heard, RRR. No JVD, murmurs, rubs, gallops or clicks.    Gastrointestinal system: Abdomen is nondistended, soft and nontender. No organomegaly or masses felt. Normal bowel sounds heard. Central nervous system: Alert and oriented. No focal neurological deficits. Extremities: Symmetric 5 x 5 power.  Skin: No rashes, lesions or ulcers, on limited exam. Psychiatry:  Mood & affect appropriate.     Data Reviewed: I have personally reviewed following labs and imaging studies  CBC: Recent Labs  Lab 12/19/17 1911 12/20/17 0623  WBC 9.9 5.1  HGB 13.2 11.1*  HCT 38.2 32.3*  MCV 91.8 93.9  PLT 117* 80*   Basic Metabolic Panel: Recent Labs  Lab 12/19/17 1911 12/20/17 0623  NA 135 140  K 3.7 3.5  CL 104 115*  CO2 21* 21*  GLUCOSE 132* 94  BUN 15 12  CREATININE 1.02* 0.77  CALCIUM 9.2 7.4*   GFR: Estimated Creatinine Clearance: 69.2 mL/min (by C-G formula based on SCr of 0.77 mg/dL). Liver Function Tests: Recent Labs  Lab 12/19/17 2203  AST 22  ALT 15  ALKPHOS 94  BILITOT 0.8  PROT 7.1  ALBUMIN 4.0   Recent Labs  Lab 12/19/17 2203  LIPASE 20   No results for input(s): AMMONIA in the last 168 hours. Coagulation Profile: No results for input(s): INR, PROTIME in the last 168 hours. Cardiac Enzymes: No results for input(s): CKTOTAL, CKMB, CKMBINDEX, TROPONINI in the last 168 hours. BNP (last 3 results) No results for input(s): PROBNP in the last 8760 hours. HbA1C: No results for input(s): HGBA1C in the last  72 hours. CBG: No results for input(s): GLUCAP in the last 168 hours. Lipid Profile: No results for input(s): CHOL, HDL, LDLCALC, TRIG, CHOLHDL, LDLDIRECT in the last 72 hours. Thyroid Function Tests: No results for input(s): TSH, T4TOTAL, FREET4, T3FREE, THYROIDAB in the last 72 hours. Anemia Panel: No results for input(s): VITAMINB12, FOLATE, FERRITIN, TIBC, IRON, RETICCTPCT in the last 72 hours. Sepsis Labs: Recent Labs  Lab 12/20/17 0015 12/20/17 0232  LATICACIDVEN 0.46* 0.42*    Recent Results (from the  past 240 hour(s))  Urine C&S     Status: None   Collection Time: 12/19/17  7:01 PM  Result Value Ref Range Status   Specimen Description   Final    URINE, CLEAN CATCH Performed at [facility], [address], [place], [place] [address]    Special Requests   Final    NONE Performed at [facility], [address] [place], [place] [address]    Culture   Final    NO GROWTH Performed at [facility], [address], [place], [place] [address]    Report Status 12/21/2017 FINAL  Final  Blood culture (routine x 2)     Status: None (Preliminary result)   Collection Time: 12/19/17 10:03 PM  Result Value Ref Range Status   Specimen Description   Final    BLOOD LEFT ANTECUBITAL Performed at [facility], [address] [place], [place] [address]    Special Requests   Final    BOTTLES DRAWN AEROBIC AND ANAEROBIC Blood Culture adequate volume Performed at [facility], [address], [place], [place] [address]    Culture   Final    NO GROWTH 1 DAY Performed at [facility], [address] [place], [place] [address]    Report Status PENDING  Incomplete  Blood culture (routine x 2)     Status: None (Preliminary result)   Collection Time: 12/19/17 10:11 PM  Result Value Ref Range Status   Specimen Description   Final    BLOOD LEFT WRIST Performed at [facility], [address] [place], [place] [address]    Special Requests   Final    BOTTLES DRAWN AEROBIC AND ANAEROBIC Blood Culture adequate volume Performed at [facility], [address], [place], [place] [address]    Culture   Final    NO GROWTH 1 DAY Performed at [facility], [address] [place], [place] [address]    Report Status PENDING  Incomplete  Wet prep, genital     Status: Abnormal   Collection Time: 12/19/17 10:26 PM  Result Value Ref Range Status   Yeast Wet Prep HPF POC NONE  SEEN NONE SEEN Final   Trich, Wet Prep NONE SEEN NONE SEEN Final   Clue Cells Wet Prep HPF POC PRESENT (A) NONE SEEN Final   WBC, Wet Prep HPF POC MODERATE (A) NONE SEEN Final   Sperm NONE SEEN  Final    Comment: Performed at [facility], [address] [place], [place] [address]         Radiology Studies: Ct Abdomen Pelvis W Contrast  Result Date: 12/20/2017 CLINICAL DATA:  Acute abdominal pain. Fever. Patient reports urinary frequency and painful urination. EXAM: CT ABDOMEN AND PELVIS WITH CONTRAST TECHNIQUE: Multidetector CT imaging of the abdomen and pelvis was performed using the standard protocol following bolus administration of intravenous contrast. CONTRAST:  ISOVUE-300 IOPAMIDOL (  ISOVUE-300) INJECTION 61% COMPARISON:  CT 09/22/2014 FINDINGS: Lower chest: No pleural fluid or consolidation. Mild hypoventilatory changes. Hepatobiliary: No focal liver abnormality is seen. Status post cholecystectomy. No biliary dilatation. Pancreas: No ductal dilatation or inflammation. Spleen: Normal in size without focal abnormality. Adrenals/Urinary Tract: No adrenal nodule. Patchy heterogeneous left renal enhancement with enhancement of the renal pelvis and ureter consistent with pyelonephritis. No intrarenal or perirenal fluid collection. Probable extrarenal pelvis configuration of the right kidney, unchanged. Cortical scarring in the upper right kidney. Homogeneous right renal enhancement. Mild enhancement of the right ureter. Urinary bladder is partially distended. No definite wall thickening. Stomach/Bowel: Lack of enteric contrast and paucity of intra-abdominal fat limits bowel assessment. Stomach is nondistended. No bowel wall thickening, obstruction or inflammation. Small to moderate colonic stool burden. Normal appendix, for example image 28 series 5. Vascular/Lymphatic: Normal caliber abdominal aorta. Retroaortic left renal vein. Hepatic artery and splenic artery arises  separately from the aorta, normal variant. 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 Dilatation of the left ovarian vein. No enlarged abdominal or pelvic lymph nodes. Reproductive: Prominent adnexal vascularity and dilatation of the left ovarian vein, maximal dimension 7 mm. No adnexal mass. Other: No free air, free fluid, or intra-abdominal fluid collection. Musculoskeletal: Curvature of the spine may be positional or scoliosis. There are no acute or suspicious osseous abnormalities. IMPRESSION: 1. Findings consistent with left pyelonephritis. Possible right ureteric enhancement may reflect ascending urinary tract infection on the right as well. No renal abscess. 2. Prominent adnexal vascularity and dilatation of the left ovarian vein, can be seen with pelvic congestion syndrome in the appropriate clinical setting. Electronically Signed   By: Rubye Oaks M.D.   On: 12/20/2017 00:51    Scheduled Meds: . enoxaparin (LOVENOX) injection  40 mg Subcutaneous Q24H   Continuous Infusions: . sodium chloride 100 mL/hr at 12/21/17 1634  . cefTRIAXone (ROCEPHIN)  IV Stopped (12/20/17 2104)  . metronidazole Stopped (12/21/17 1422)     LOS: 1 day    Time spent: > 15 minutes    Penny Pia, MD [facility] Pager [phone]  If 7PM-7AM, please contact night-coverage [email or web address] Password [facility] 12/21/2017, 5:13 PM

## 2017-12-21 NOTE — Plan of Care (Signed)
  Pain Managment: General experience of comfort will improve 12/21/2017 2114 - Progressing by Antionette CharPeng, Bernarda Erck P, RN

## 2017-12-22 DIAGNOSIS — N12 Tubulo-interstitial nephritis, not specified as acute or chronic: Secondary | ICD-10-CM | POA: Diagnosis not present

## 2017-12-22 LAB — GC/CHLAMYDIA PROBE AMP (~~LOC~~) NOT AT ARMC
CHLAMYDIA, DNA PROBE: NEGATIVE
Neisseria Gonorrhea: NEGATIVE

## 2017-12-22 MED ORDER — ONDANSETRON HCL 4 MG PO TABS
4.0000 mg | ORAL_TABLET | Freq: Four times a day (QID) | ORAL | 0 refills | Status: DC | PRN
Start: 2017-12-22 — End: 2019-01-22

## 2017-12-22 MED ORDER — METRONIDAZOLE 500 MG PO TABS: 500.0000 mg | ORAL_TABLET | Freq: Three times a day (TID) | ORAL | 0 refills | Status: AC

## 2017-12-22 MED ORDER — CEFPODOXIME PROXETIL 200 MG PO TABS: 200.0000 mg | ORAL_TABLET | Freq: Two times a day (BID) | ORAL | 0 refills | Status: AC

## 2017-12-22 NOTE — Discharge Summary (Signed)
Physician Discharge Summary  Regina Munroember A Wilson RUE:[id number] DOB: January 09, 1971 DOA: 12/19/2017  PCP: [facility]  Admit date: 12/19/2017 Discharge date: 12/22/2017  Time spent: > 35  minutes  Recommendations for Outpatient Follow-up:  Ensure patient completes antibiotic regimen   Discharge Diagnoses:  Active Problems:   Pyelonephritis   Discharge Condition: stable  Diet recommendation: regular diet  Filed Weights   12/19/17 1817  Weight: 49.9 kg (110 lb)    History of present illness:  47 y.o.femalewith medical history significant of polysubstance abuse; who presents with complaints of diffuse abdominal pain and pressure with urinating over the last week. Notes pain radiating to her back with generalized body aches. Was initially seen at urgent care yesterday, and started on ciprofloxacin after diagnosed with a urinary tract infection.   Hospital Course:  Active Problems:   Pyelonephritis - Fever has resolved. Will transition patient to vantin 200 mg po bid for 4 more days.  - improved on Rocephin. - blood culture negative, urine culture non revealing  Hypotension -Resolved   Bacterial vaginosis - Continue metronidazole on d/c will treat for 7 days total.  Procedures:  none  Consultations:  None  Discharge Exam: Vitals:   12/21/17 2035 12/22/17 0554  BP: (!) 91/38 (!) 90/54  Pulse: (!) 52 (!) 49  Resp: 15 12  Temp: 98.9 F (37.2 C) 98.1 F (36.7 C)  SpO2: 97% 94%    General: Pt in nad, alert and awake Cardiovascular: rrr, no rubs Respiratory: no increased wob, no wheezes  Discharge Instructions   Discharge Instructions    Call MD for:  persistant nausea and vomiting   Complete by:  As directed    Call MD for:  temperature >100.4   Complete by:  As directed    Diet - low sodium heart healthy   Complete by:  As directed    Discharge instructions   Complete by:  As directed    Please follow up with your pcp within the  next 1 week for further evaluation and recommendations.   Increase activity slowly   Complete by:  As directed      Allergies as of 12/22/2017      Reactions   Other Nausea And Vomiting, Other (See Comments)   2 antibiotics cause her to get sick. She cannot remember the name of them. [facility] and [facility] don't have any allergy information on file.       Medication List    STOP taking these medications   ciprofloxacin 500 MG tablet Commonly known as:  CIPRO   naproxen 500 MG tablet Commonly known as:  NAPROSYN     TAKE these medications   cefpodoxime 200 MG tablet Commonly known as:  VANTIN Take 1 tablet (200 mg total) by mouth 2 (two) times daily for 4 days.   metroNIDAZOLE 500 MG tablet Commonly known as:  FLAGYL Take 1 tablet (500 mg total) by mouth 3 (three) times daily for 4 days.   ondansetron 4 MG tablet Commonly known as:  ZOFRAN Take 1 tablet (4 mg total) by mouth every 6 (six) hours as needed for nausea.      Allergies  Allergen Reactions  . Other Nausea And Vomiting and Other (See Comments)    2 antibiotics cause her to get sick. She cannot remember the name of them. [facility] and [facility] don't have any allergy information on file.       The results of significant diagnostics from this hospitalization (  including imaging, microbiology, ancillary and laboratory) are listed below for reference.    Significant Diagnostic Studies: Ct Abdomen Pelvis W Contrast  Result Date: 12/20/2017 CLINICAL DATA:  Acute abdominal pain. Fever. Patient reports urinary frequency and painful urination. EXAM: CT ABDOMEN AND PELVIS WITH CONTRAST TECHNIQUE: Multidetector CT imaging of the abdomen and pelvis was performed using the standard protocol following bolus administration of intravenous contrast. CONTRAST:  ISOVUE-300 IOPAMIDOL (ISOVUE-300) INJECTION 61% COMPARISON:  CT 09/22/2014 FINDINGS: Lower chest: No pleural fluid or consolidation. Mild  hypoventilatory changes. Hepatobiliary: No focal liver abnormality is seen. Status post cholecystectomy. No biliary dilatation. Pancreas: No ductal dilatation or inflammation. Spleen: Normal in size without focal abnormality. Adrenals/Urinary Tract: No adrenal nodule. Patchy heterogeneous left renal enhancement with enhancement of the renal pelvis and ureter consistent with pyelonephritis. No intrarenal or perirenal fluid collection. Probable extrarenal pelvis configuration of the right kidney, unchanged. Cortical scarring in the upper right kidney. Homogeneous right renal enhancement. Mild enhancement of the right ureter. Urinary bladder is partially distended. No definite wall thickening. Stomach/Bowel: Lack of enteric contrast and paucity of intra-abdominal fat limits bowel assessment. Stomach is nondistended. No bowel wall thickening, obstruction or inflammation. Small to moderate colonic stool burden. Normal appendix, for example image 28 series 5. Vascular/Lymphatic: Normal caliber abdominal aorta. Retroaortic left renal vein. Hepatic artery and splenic artery arises separately from the aorta, normal variant. 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 Dilatation of the left ovarian vein. No enlarged abdominal or pelvic lymph nodes. Reproductive: Prominent adnexal vascularity and dilatation of the left ovarian vein, maximal dimension 7 mm. No adnexal mass. Other: No free air, free fluid, or intra-abdominal fluid collection. Musculoskeletal: Curvature of the spine may be positional or scoliosis. There are no acute or suspicious osseous abnormalities. IMPRESSION: 1. Findings consistent with left pyelonephritis. Possible right ureteric enhancement may reflect ascending urinary tract infection on the right as well. No renal abscess. 2. Prominent adnexal vascularity and dilatation of the left ovarian vein, can be seen with pelvic congestion syndrome in the appropriate clinical setting.  Electronically Signed   By: Rubye Oaks M.D.   On: 12/20/2017 00:51    Microbiology: Recent Results (from the past 240 hour(s))  Urine C&S     Status: None   Collection Time: 12/19/17  7:01 PM  Result Value Ref Range Status   Specimen Description   Final    URINE, CLEAN CATCH Performed at [facility], [address], [place], [place] [address]    Special Requests   Final    NONE Performed at [facility], [address] [place], [place] [address]    Culture   Final    NO GROWTH Performed at [facility], [address], [place], [place] [address]    Report Status 12/21/2017 FINAL  Final  Blood culture (routine x 2)     Status: None (Preliminary result)   Collection Time: 12/19/17 10:03 PM  Result Value Ref Range Status   Specimen Description   Final    BLOOD LEFT ANTECUBITAL Performed at [facility], [address] [place], [place] [address]    Special Requests   Final    BOTTLES DRAWN AEROBIC AND ANAEROBIC Blood Culture adequate volume Performed at [facility], [address], [place], [place] [address]    Culture   Final    NO GROWTH 1 DAY Performed at [facility], [address], [place],  [place] [address]    Report Status PENDING  Incomplete  Blood culture (routine x 2)     Status: None (Preliminary result)   Collection Time: 12/19/17 10:11 PM  Result Value Ref Range Status   Specimen Description   Final    BLOOD LEFT WRIST Performed at [facility], [address], [place], [place] [address]    Special Requests   Final    BOTTLES DRAWN AEROBIC AND ANAEROBIC Blood Culture adequate volume Performed at [facility], [address] [place], [place] [address]    Culture   Final    NO GROWTH 1 DAY Performed at [facility], [address] [place], [place] [address]    Report Status PENDING  Incomplete  Wet prep, genital      Status: Abnormal   Collection Time: 12/19/17 10:26 PM  Result Value Ref Range Status   Yeast Wet Prep HPF POC NONE SEEN NONE SEEN Final   Trich, Wet Prep NONE SEEN NONE SEEN Final   Clue Cells Wet Prep HPF POC PRESENT (A) NONE SEEN Final   WBC, Wet Prep HPF POC MODERATE (A) NONE SEEN Final   Sperm NONE SEEN  Final    Comment: Performed at [facility], [address] [place], [place] [address]     Labs: Basic Metabolic Panel: Recent Labs  Lab 12/19/17 1911 12/20/17 0623  NA 135 140  K 3.7 3.5  CL 104 115*  CO2 21* 21*  GLUCOSE 132* 94  BUN 15 12  CREATININE 1.02* 0.77  CALCIUM 9.2 7.4*   Liver Function Tests: Recent Labs  Lab 12/19/17 2203  AST 22  ALT 15  ALKPHOS 94  BILITOT 0.8  PROT 7.1  ALBUMIN 4.0   Recent Labs  Lab 12/19/17 2203  LIPASE 20   No results for input(s): AMMONIA in the last 168 hours. CBC: Recent Labs  Lab 12/19/17 1911 12/20/17 0623  WBC 9.9 5.1  HGB 13.2 11.1*  HCT 38.2 32.3*  MCV 91.8 93.9  PLT 117* 80*   Cardiac Enzymes: No results for input(s): CKTOTAL, CKMB, CKMBINDEX, TROPONINI in the last 168 hours. BNP: BNP (last 3 results) No results for input(s): BNP in the last 8760 hours.  ProBNP (last 3 results) No results for input(s): PROBNP in the last 8760 hours.  CBG: No results for input(s): GLUCAP in the last 168 hours.     Signed:  Penny Pia MD.  [facility] 12/22/2017, 12:35 PM

## 2017-12-25 LAB — CULTURE, BLOOD (ROUTINE X 2)
CULTURE: NO GROWTH
Culture: NO GROWTH
Special Requests: ADEQUATE
Special Requests: ADEQUATE

## 2017-12-29 IMAGING — US US RENAL
1 series · 14 of 25 positions shown · non-contrast
Comparison: 09/22/2014 CT abdomen and pelvis

CLINICAL DATA: 44 y/o  F; right flank pain for 10 years.

EXAM:
RENAL / URINARY TRACT ULTRASOUND COMPLETE

[Series 1: us renal · 0.17mm/px · 14 of 42 slices shown]
[im 1/42]
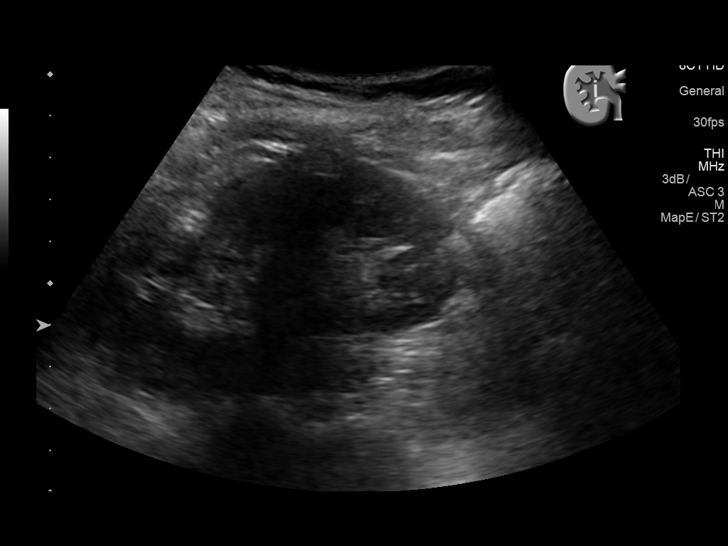
[im 4/42]
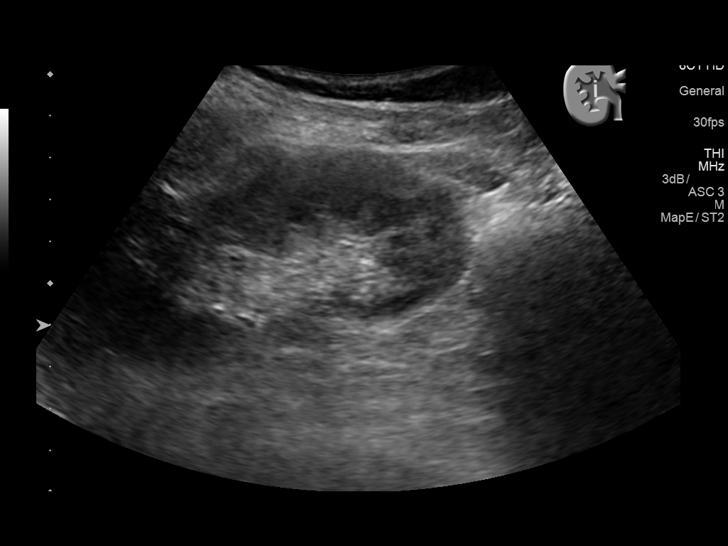
[im 7/42]
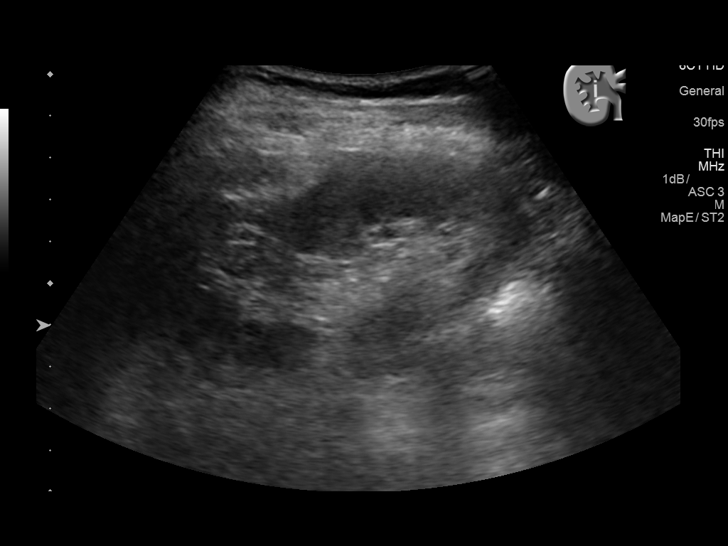
[im 11/42]
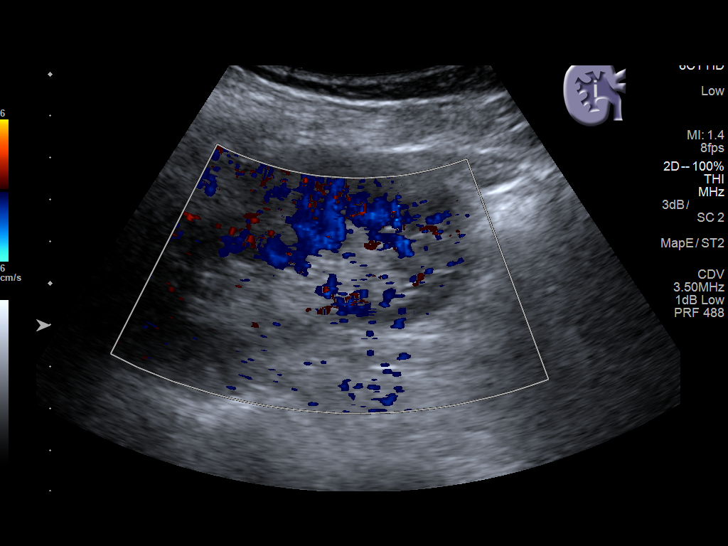
[im 14/42]
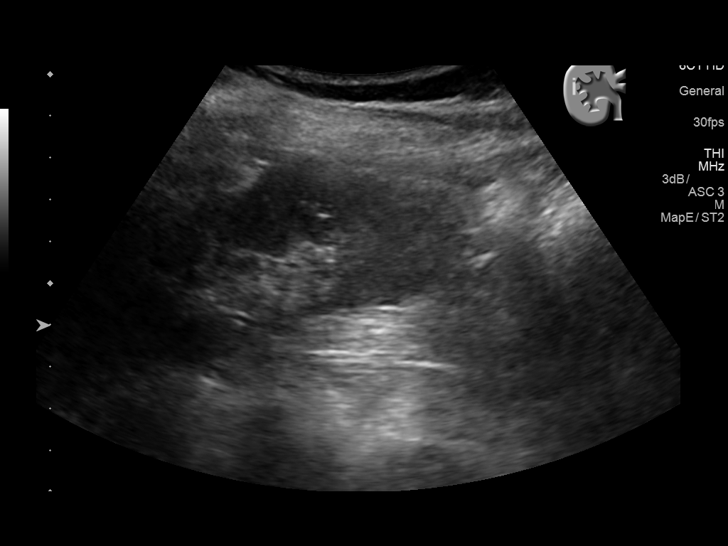
[im 16/42]
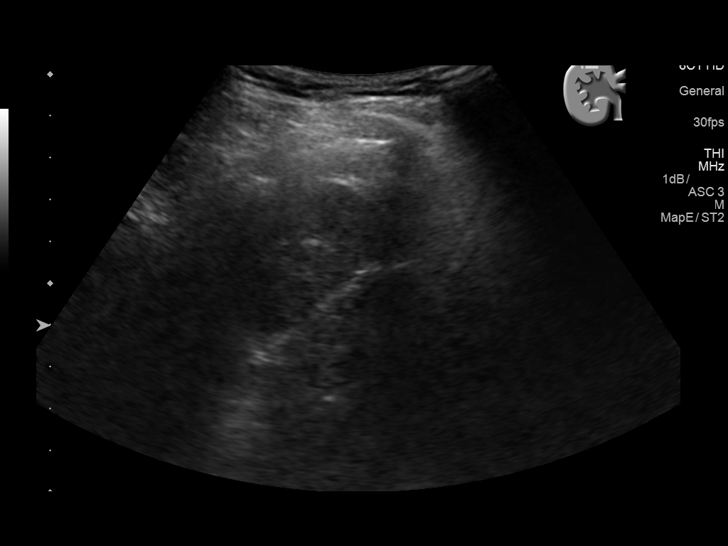
[im 19/42]
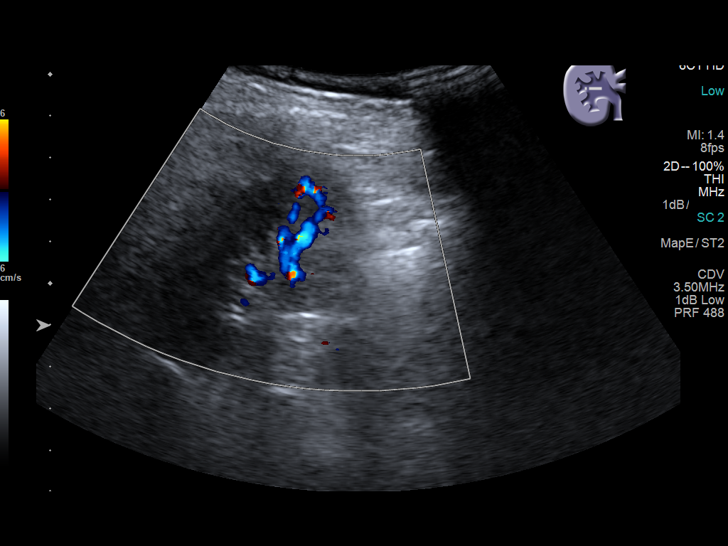
[im 23/42]
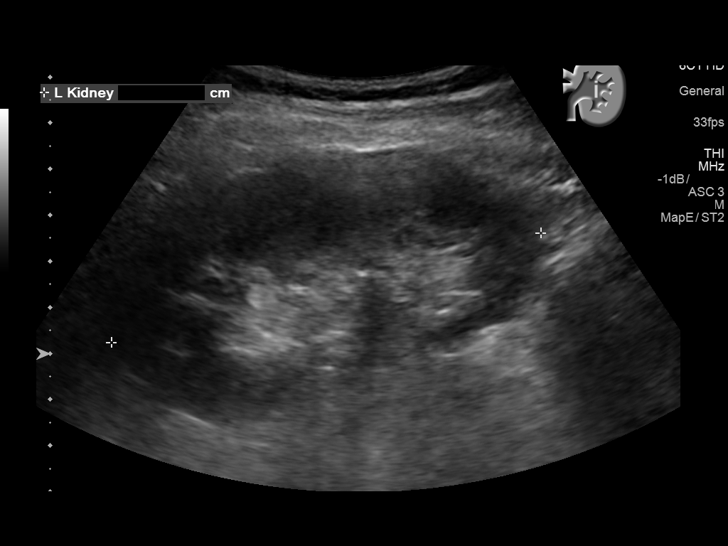
[im 26/42]
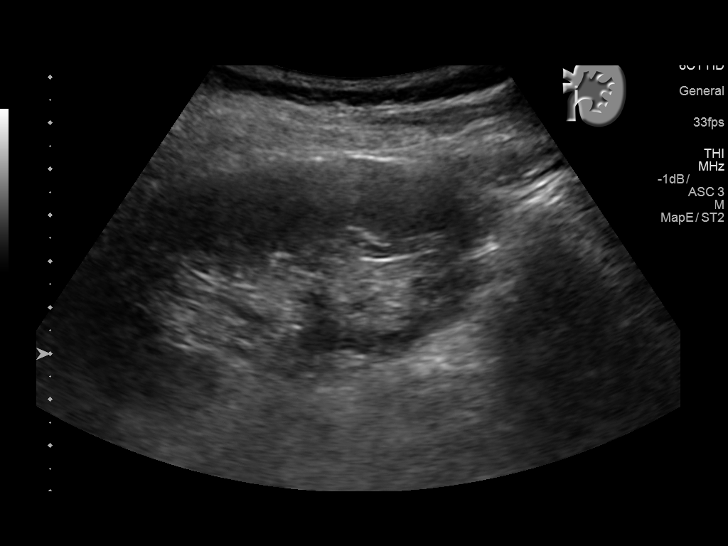
[im 28/42]
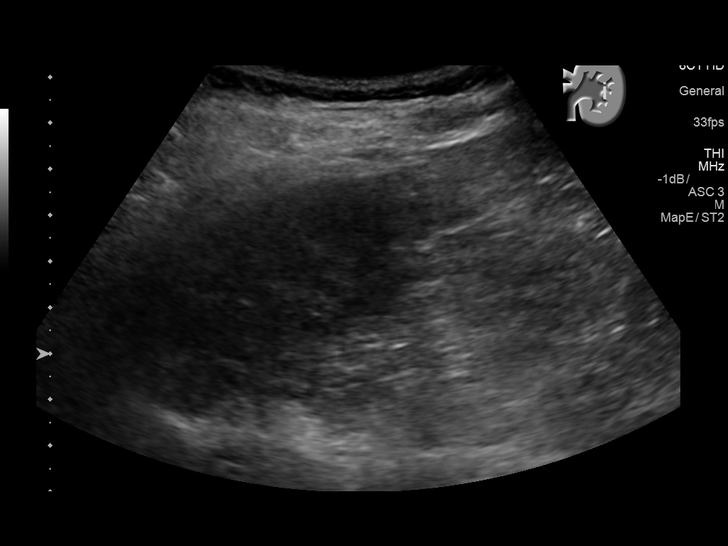
[im 31/42]
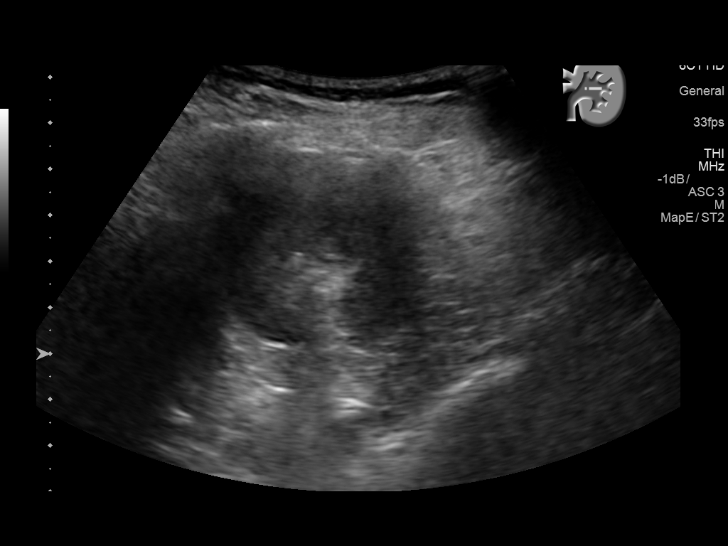
[im 35/42]
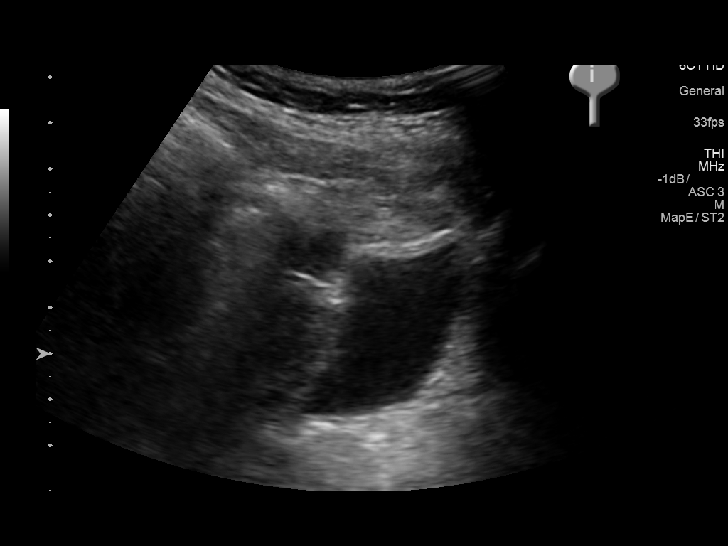
[im 38/42]
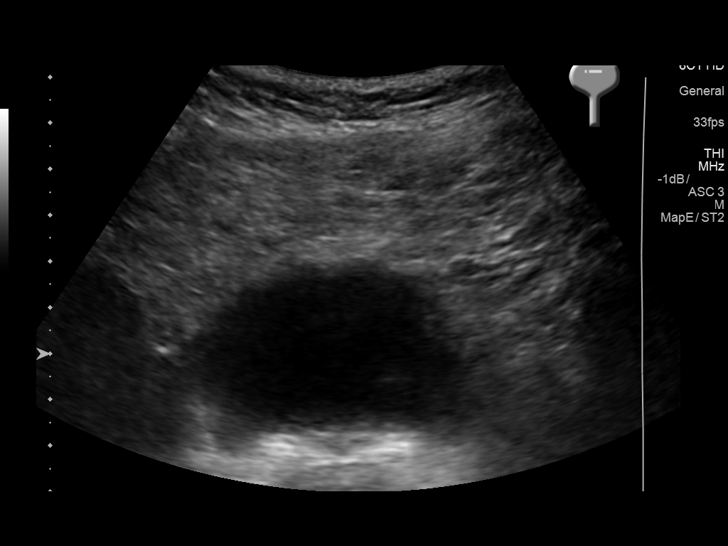
[im 42/42]
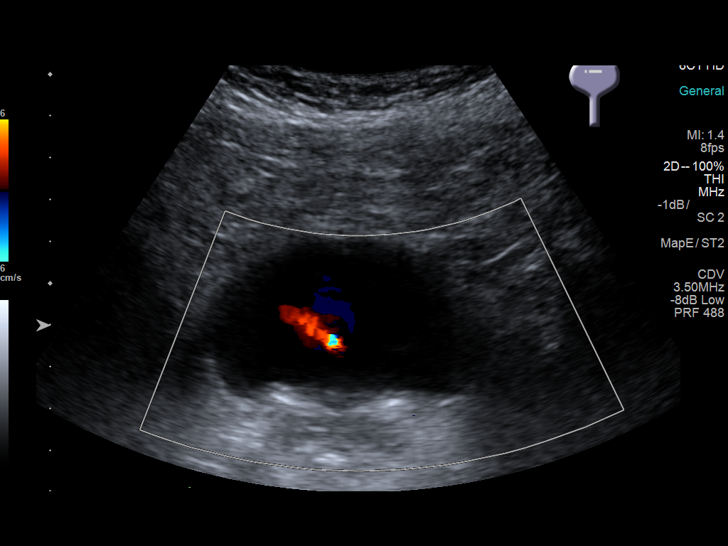

[14 of 25 positions shown; findings below may reference images not displayed]

FINDINGS: Right Kidney:

Length: 8.8 cm. Echogenicity within normal limits. No mass or
hydronephrosis visualized. Interpolar cortical defect similar to
prior CT.

Left Kidney:

Length: 9.6 cm. Echogenicity within normal limits. No mass or
hydronephrosis visualized.

Bladder:

Appears normal for degree of bladder distention. Bilateral renal
jets noted.
IMPRESSION: No mass or hydronephrosis of the kidneys visualized.

By: Zamaleke Bbe M.D.

## 2018-12-22 ENCOUNTER — Emergency Department (HOSPITAL_COMMUNITY): Payer: Self-pay

## 2018-12-22 ENCOUNTER — Emergency Department (HOSPITAL_COMMUNITY)
Admission: EM | Admit: 2018-12-22 | Discharge: 2018-12-22 | Disposition: A | Discharge status: Home / Self Care | Payer: Self-pay | Attending: Emergency Medicine | Admitting: Emergency Medicine

## 2018-12-22 ENCOUNTER — Encounter (HOSPITAL_COMMUNITY): Payer: Self-pay | Admitting: Emergency Medicine

## 2018-12-22 DIAGNOSIS — M25532 Pain in left wrist: Secondary | ICD-10-CM | POA: Insufficient documentation

## 2018-12-22 DIAGNOSIS — F17210 Nicotine dependence, cigarettes, uncomplicated: Secondary | ICD-10-CM | POA: Insufficient documentation

## 2018-12-22 NOTE — Discharge Instructions (Signed)
You can take Tylenol or Ibuprofen as directed for pain. You can alternate Tylenol and Ibuprofen every 4 hours. If you take Tylenol at 1pm, then you can take Ibuprofen at 5pm. Then you can take Tylenol again at 9pm.   Follow the RICE (Rest, Ice, Compression, Elevation) protocol as directed.   Wear splint for support and stabilization.  Follow-up with Dr. Magnus Ivan.  Call his office tomorrow arrange for an appointment.  Return emergency department for any fever, redness or swelling of the wrist, worsening pain or any other worsening or concerning symptoms.

## 2018-12-22 NOTE — ED Provider Notes (Signed)
[facility]-EMERGENCY DEPT Provider Note   CSN: [id number] Arrival date & time: 12/22/18  1314    History   Chief Complaint Chief Complaint  Patient presents with  . Wrist Pain    HPI Regina Daniel is a 48 y.o. female past medical history of left wrist fracture who presents for evaluation of 2 days of progressively worsening left wrist pain.  She states that she started noticing some pain to the lateral aspect of her left wrist.  She states she might have noticed some overlying soft tissue swelling but has not had any overlying warmth or erythema.  She denies any preceding trauma, injury, fall.  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  She states that today, she was attempting to pick up something and states that she had severe pain in the left wrist.  She also describes a "floating tingling sensation" in her wrist.  She reports it is not decreased sensation denies any numbness but just feels like "there is stuff floating and there."  Called her orthopedic doctor who advised to come to the ED for further evaluation.  Patient reports she has baseline weakness of the arm secondary to her previous injury.  No new weakness.      The history is provided by the patient.    Past Medical History:  Diagnosis Date  . Drug abuse (HCC)   . Left wrist fracture JAN 6.2017  . 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 Renal disorder    RIGHT KIDNEY WAS DETERORRITING DUE TO DRUG ABUSE, PT TOLD THIS YEARS AGO    Patient Active Problem List   Diagnosis Date Noted  . Pyelonephritis 12/20/2017  . Acute pain of left shoulder 01/23/2017  . 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 Distal radius fracture 03/29/2016    Past Surgical History:  Procedure Laterality Date  . CHOLECYSTECTOMY    . NASAL SEPTUM SURGERY  MARCH 2017  .  ORIF WRIST FRACTURE Left 03/29/2016   Procedure: LEFT WRIST OSTEOTOMY WITH OPEN REDUCTION INTERNAL FIXATION (ORIF) ;  Surgeon: Kathryne Hitch, MD;  Location: [facility];  Service: Orthopedics;  Laterality: Left;  Left Intra-clavicular block  . TUBAL LIGATION    . WRIST SURGERY Right YEARS AGO   TENDON REPAIR     OB History   No obstetric history on file.      Home Medications    Prior to Admission medications   Medication Sig Start Date End Date Taking? Authorizing Provider  ondansetron (ZOFRAN) 4 MG tablet Take 1 tablet (4 mg total) by mouth every 6 (six) hours as needed for nausea. 12/22/17   Penny Pia, MD    Family History No family history on file.  Social History Social History   Tobacco Use  . Smoking status: Current Every Day Smoker    Packs/day: 0.50    Years: 32.00    Pack years: 16.00    Types: Cigarettes  . Smokeless tobacco: Never Used  . Tobacco comment: USING E CIGARETTE NOW  Substance Use Topics  . Alcohol use: No  . Drug use: No    Types: Cocaine    Comment: last used 04-15-15     Allergies   Other   Review of Systems Review of Systems  Constitutional: Negative for fever.  Musculoskeletal:  Left wrist pain  Skin: Negative for color change.  Neurological: Positive for weakness (Chronic). Negative for numbness and headaches.     Physical Exam Updated Vital Signs BP (!) 137/96 (BP Location: Right Arm)   Pulse 62   Temp 97.9 F (36.6 C) (Oral)   Resp 17   Ht 5\' 2"  (1.575 m)   Wt 48.5 kg   LMP 11/04/2010   SpO2 100%   BMI 19.57 kg/m   Physical Exam Vitals signs and nursing note reviewed.  Constitutional:      Appearance: She is well-developed.  HENT:     Head: Normocephalic and atraumatic.  Eyes:     General: No scleral icterus.       Right eye: No discharge.        Left eye: No discharge.     Conjunctiva/sclera: Conjunctivae normal.  Cardiovascular:     Pulses:          Radial pulses are 2+ on the right side and  2+ on the left side.  Pulmonary:     Effort: Pulmonary effort is normal.  Musculoskeletal:     Comments: Diffuse tenderness palpation noted to the radial aspect of the left wrist.  No deformity or crepitus noted.  There is some mild overlying soft tissue swelling towards the dorsal aspect of the wrist.  No overlying warmth, erythema.  Flexion/tension intact.  Patient can flex and extend all 5 digits of left hand without any difficulty.  Well-healed surgical incision, scar noted to the volar aspect of the left wrist.  No surrounding warmth, erythema, purulent drainage.  No tenderness palpation noted to left elbow, left shoulder.  Skin:    General: Skin is warm and dry.     Capillary Refill: Capillary refill takes less than 2 seconds.     Comments: Good distal cap refill. LUE is not dusky in appearance or cool to touch.   Neurological:     Mental Status: She is alert.     Comments: Slight decreased grip strength noted on the left hand which patient states is baseline.  She reports she lost several motor function of her left hand after her initial surgery and injury.  Normal sensation noted to left upper extremity.  Psychiatric:        Speech: Speech normal.        Behavior: Behavior normal.      ED Treatments / Results  Labs (all labs ordered are listed, but only abnormal results are displayed) Labs Reviewed - No data to display  EKG None  Radiology Dg Wrist Complete Left  Result Date: 12/22/2018 CLINICAL DATA:  Acute left wrist pain without recent injury. EXAM: LEFT WRIST - COMPLETE 3+ VIEW COMPARISON:  Radiographs of November 11, 2015. FINDINGS: Status post surgical internal fixation of distal left radial fracture. No acute fracture or dislocation is noted. No significant degenerative changes noted. No soft tissue abnormality is noted. IMPRESSION: No acute abnormality seen in the left wrist. Electronically Signed   By: Lupita RaiderJames  Green Jr, M.D.   On: 12/22/2018 15:22     Procedures Procedures (including critical care time)  Medications Ordered in ED Medications - No data to display   Initial Impression / Assessment and Plan / ED Course  I have reviewed the triage vital signs and the nursing notes.  Pertinent labs & imaging results that were available during my care of the patient were reviewed by me and considered in my medical decision making (see chart for details).  48 year old female with past medical history of left wrist fracture with repair in 2017 by Dr. Magnus Ivan who presents for evaluation of 2 days of progressively worsening left wrist pain.  No preceding trauma, injury, fall.  No fevers, warmth, erythema.  Reports a "floating tingling" sensation. Patient is afebrile, non-toxic appearing, sitting comfortably on examination table. Vital signs reviewed and stable. Patient is neurovascularly intact.  Concern for fracture versus dislocation versus wrist sprain versus hardware malfunction.  History/physical exam not concerning for septic arthritis, acute arterial embolism, DVT of upper extremity.  Do not suspect CVA given history/physical exam.  Will plan for x-ray.  X-ray reviewed.  Negative for any acute bony abnormality.  Hardware appears in place.  Discussed results with patient.  We will plan to provide any splint for support and stabilization.  Instructed her to call orthopedics tomorrow to arrange for follow-up appointment. At this time, patient exhibits no emergent life-threatening condition that require further evaluation in ED or admission. Patient had ample opportunity for questions and discussion. All patient's questions were answered with full understanding. Strict return precautions discussed. Patient expresses understanding and agreement to plan.   Portions of this note were generated with Scientist, clinical (histocompatibility and immunogenetics). Dictation errors may occur despite best attempts at proofreading.  Final Clinical Impressions(s) / ED Diagnoses    Final diagnoses:  Left wrist pain    ED Discharge Orders    None       Rosana Hoes 12/22/18 1639    Shaune Pollack, MD 12/23/18 1343

## 2018-12-22 NOTE — ED Triage Notes (Signed)
Pt c/o left wrist pain that has been worse over past 2 days. Reports that today went to pick up a sofa bag and pain made her drop to her knees. Pt has brace on left wrist and reports it as "floating tingling". Called orthopedic office Dr Rayburn Ma but was told to go to ED.

## 2018-12-24 ENCOUNTER — Ambulatory Visit (INDEPENDENT_AMBULATORY_CARE_PROVIDER_SITE_OTHER): Payer: Self-pay | Admitting: Physician Assistant

## 2018-12-28 ENCOUNTER — Ambulatory Visit (INDEPENDENT_AMBULATORY_CARE_PROVIDER_SITE_OTHER): Payer: Self-pay | Admitting: Physician Assistant

## 2018-12-28 ENCOUNTER — Encounter (INDEPENDENT_AMBULATORY_CARE_PROVIDER_SITE_OTHER): Payer: Self-pay | Admitting: Physician Assistant

## 2018-12-28 DIAGNOSIS — M79645 Pain in left finger(s): Secondary | ICD-10-CM

## 2018-12-28 NOTE — Progress Notes (Signed)
Office Visit Note   Patient: Regina Daniel           Date of Birth: [birth year]           MRN: [id number] Visit Date: 12/28/2018              Requested by: [facility], [facility] [address] [place], [place] [address] PCP: [facility]   Assessment & Plan: Visit Diagnoses:  1. Thumb pain, left     Plan: We will obtain an MRI of her left thumb to rule out an extensor pollicis longus tendon rupture.  The dorsal splint is applied to the left thumb.  She did take this on for hygiene purposes.  She is given extra splint and tape material for this.  Have her follow-up after the MRI to go over results discuss further treatment.  Follow-Up Instructions: Return for After MRI.   Orders:  Orders Placed This Encounter  Procedures  . MR FINGERS LEFT WO CONTRAST   No orders of the defined types were placed in this encounter.     Procedures: No procedures performed   Clinical Data: No additional findings.   Subjective: Chief Complaint  Patient presents with  . Left Wrist - Pain    HPI Regina Daniel returns today due to left thumb pain and decreased range of motion.  She had open reduction internal fixation of the left wrist fracture 03/29/2016.  She is done well until recently.  She is having some pain in her right wrist and thumb prior to Wednesday.  And on Wednesday went to the ER due to increased pain and inability to extend her thumb.  Radiographs were obtained in the ER I reviewed these.  Show the left wrist fracture to be completely healed.  There is no evidence of hardware failure.  No acute fractures.  She denies any numbness tingling down the arm denies any neck pain. Review of Systems Please see HPI otherwise negative  Objective: Vital Signs: LMP 11/04/2010   Physical Exam Constitutional:      Appearance: She is normal weight. She is not ill-appearing or diaphoretic.  Pulmonary:     Effort: Pulmonary effort is normal.    Neurological:     Mental Status: She is alert and oriented to person, place, and time.     Ortho Exam Right hand she has full range of motion of all fingers except for the thumb.  She is unable to bring the right thumb up to full extension.  Is able to flex the thumb.  She has tenderness along the extensor compartment of the left thumb.  Slight swelling about the thumb but no ecchymosis erythema.  Radial pulses 2+ bilaterally and equal and symmetric. Specialty Comments:  No specialty comments available.  Imaging: No results found.   PMFS History: Patient Active Problem List   Diagnosis Date Noted  . Pyelonephritis 12/20/2017  . Acute pain of left shoulder 01/23/2017  . 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 Distal radius fracture 03/29/2016   Past Medical History:  Diagnosis Date  . Drug abuse (HCC)   . Left wrist fracture JAN 6.2017  . 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 Renal disorder    RIGHT KIDNEY WAS DETERORRITING DUE TO DRUG ABUSE, PT TOLD THIS YEARS AGO    History reviewed. No pertinent family history.  Past Surgical History:  Procedure Laterality Date  . CHOLECYSTECTOMY    . NASAL SEPTUM SURGERY  MARCH 2017  . ORIF WRIST FRACTURE Left 03/29/2016   Procedure: LEFT WRIST OSTEOTOMY WITH OPEN REDUCTION INTERNAL FIXATION (ORIF) ;  Surgeon: Kathryne Hitch, MD;  Location: [facility];  Service: Orthopedics;  Laterality: Left;  Left Intra-clavicular block  . TUBAL LIGATION    . WRIST SURGERY Right YEARS AGO   TENDON REPAIR   Social History   Occupational History  . Not on file  Tobacco Use  . Smoking status: Current Every Day Smoker    Packs/day: 0.50    Years: 32.00    Pack years: 16.00    Types: Cigarettes  . Smokeless tobacco: Never Used  . Tobacco comment: USING E CIGARETTE NOW  Substance and Sexual Activity  . Alcohol use: No  . Drug use: No     Types: Cocaine    Comment: last used 04-15-15  . Sexual activity: Not on file

## 2018-12-29 ENCOUNTER — Telehealth (INDEPENDENT_AMBULATORY_CARE_PROVIDER_SITE_OTHER): Payer: Self-pay | Admitting: Orthopaedic Surgery

## 2018-12-29 NOTE — Telephone Encounter (Signed)
Tramadol 50 mg one po q 6 #30 zero 

## 2018-12-29 NOTE — Telephone Encounter (Signed)
Patient called advised she is hurting pretty bad and Tylenol and Ibuprofen is not helping her at all. Patient said she has a ruff night last night. The number to contact patient is [phone]

## 2018-12-29 NOTE — Telephone Encounter (Signed)
Medication request, you saw patient on 12/28/18

## 2018-12-30 MED ORDER — TRAMADOL HCL 50 MG PO TABS: 50.0000 mg | ORAL_TABLET | Freq: Four times a day (QID) | ORAL | 0 refills | Status: DC | PRN

## 2018-12-30 NOTE — Telephone Encounter (Signed)
Pharmacy  [facility]   Patient called to check on the status of her medication refill. I did advise patient that Bronson Curb responded to her message will take care of her medication request when he comes in. Please call patient when medication is sent to the pharmacy.

## 2018-12-30 NOTE — Telephone Encounter (Signed)
[facility] and gave Rx to pharmacist, they will call patient once ready.

## 2018-12-30 NOTE — Telephone Encounter (Signed)
Patient called again today to check on the status of her pain medication that she requested yesterday.  She stated that the tylenol is not helping at all.  Please advise.

## 2018-12-31 ENCOUNTER — Ambulatory Visit (HOSPITAL_COMMUNITY)
Admission: RE | Admit: 2018-12-31 | Discharge: 2018-12-31 | Disposition: A | Discharge status: Home / Self Care | Payer: Self-pay | Source: Ambulatory Visit | Attending: Physician Assistant | Admitting: Physician Assistant

## 2018-12-31 DIAGNOSIS — M79645 Pain in left finger(s): Secondary | ICD-10-CM | POA: Insufficient documentation

## 2019-01-01 ENCOUNTER — Encounter (INDEPENDENT_AMBULATORY_CARE_PROVIDER_SITE_OTHER): Payer: Self-pay

## 2019-01-04 ENCOUNTER — Other Ambulatory Visit (INDEPENDENT_AMBULATORY_CARE_PROVIDER_SITE_OTHER): Payer: Self-pay

## 2019-01-04 ENCOUNTER — Telehealth (INDEPENDENT_AMBULATORY_CARE_PROVIDER_SITE_OTHER): Payer: Self-pay

## 2019-01-04 DIAGNOSIS — M25532 Pain in left wrist: Secondary | ICD-10-CM

## 2019-01-04 NOTE — Telephone Encounter (Signed)
The last MRI she had was supposed to include the wrist and it didn't so on the one I just put in I put include thumb, he needs to see that certain tendon in the MRI that got cut off please

## 2019-01-05 ENCOUNTER — Ambulatory Visit (INDEPENDENT_AMBULATORY_CARE_PROVIDER_SITE_OTHER): Payer: Self-pay | Admitting: Orthopaedic Surgery

## 2019-01-05 NOTE — Telephone Encounter (Signed)
noted 

## 2019-01-06 ENCOUNTER — Ambulatory Visit (INDEPENDENT_AMBULATORY_CARE_PROVIDER_SITE_OTHER): Payer: Self-pay | Admitting: Orthopaedic Surgery

## 2019-01-09 ENCOUNTER — Ambulatory Visit (HOSPITAL_COMMUNITY)
Admission: RE | Admit: 2019-01-09 | Discharge: 2019-01-09 | Disposition: A | Discharge status: Home / Self Care | Payer: Self-pay | Source: Ambulatory Visit | Attending: Physician Assistant | Admitting: Physician Assistant

## 2019-01-09 DIAGNOSIS — M25532 Pain in left wrist: Secondary | ICD-10-CM | POA: Insufficient documentation

## 2019-01-11 ENCOUNTER — Other Ambulatory Visit (INDEPENDENT_AMBULATORY_CARE_PROVIDER_SITE_OTHER): Payer: Self-pay

## 2019-01-11 ENCOUNTER — Telehealth (INDEPENDENT_AMBULATORY_CARE_PROVIDER_SITE_OTHER): Payer: Self-pay | Admitting: Physician Assistant

## 2019-01-11 DIAGNOSIS — M79645 Pain in left finger(s): Secondary | ICD-10-CM

## 2019-01-11 NOTE — Telephone Encounter (Signed)
Called [name] today and reviewed the results of the MRI of her left wrist.  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  Spelt is due to prominence Lister's tubercle.  Reviewed radiographs with Dr. Magnus Ivan feels this is non-prominent Lister tubercle but actually callus formation of the fracture healing.  There is no evidence of hardware prominence resulting in the tear of the proximal extensor pollicis longus tendon.  We will refer her to hand surgery for evaluation and treatment.  She is aware and will be expecting their phone call.

## 2019-01-14 ENCOUNTER — Ambulatory Visit (INDEPENDENT_AMBULATORY_CARE_PROVIDER_SITE_OTHER): Payer: Self-pay | Admitting: Physician Assistant

## 2019-01-22 ENCOUNTER — Other Ambulatory Visit: Payer: Self-pay

## 2019-01-22 ENCOUNTER — Encounter (HOSPITAL_BASED_OUTPATIENT_CLINIC_OR_DEPARTMENT_OTHER): Payer: Self-pay | Admitting: *Deleted

## 2019-01-25 ENCOUNTER — Encounter (HOSPITAL_COMMUNITY): Payer: Self-pay | Admitting: Anesthesiology

## 2019-01-25 ENCOUNTER — Other Ambulatory Visit: Payer: Self-pay | Admitting: Orthopedic Surgery

## 2019-01-26 ENCOUNTER — Ambulatory Visit (HOSPITAL_BASED_OUTPATIENT_CLINIC_OR_DEPARTMENT_OTHER): Admission: RE | Admit: 2019-01-26 | Payer: Self-pay | Source: Home / Self Care | Admitting: Orthopedic Surgery

## 2019-01-26 HISTORY — DX: Nicotine dependence, unspecified, uncomplicated: F17.200

## 2019-01-26 HISTORY — DX: Strain of other specified muscles, fascia and tendons at wrist and hand level, unspecified hand, initial encounter: S66.819A

## 2019-01-26 HISTORY — DX: Other psychoactive substance abuse, in remission: F19.11

## 2019-01-26 SURGERY — EXPLORATION, TENDON
Anesthesia: Regional | Laterality: Left

## 2019-01-28 ENCOUNTER — Telehealth (INDEPENDENT_AMBULATORY_CARE_PROVIDER_SITE_OTHER): Payer: Self-pay | Admitting: Physician Assistant

## 2019-01-28 NOTE — Telephone Encounter (Signed)
Thank you :)

## 2019-01-28 NOTE — Telephone Encounter (Signed)
Pt called stating she had a MRI in feb and it was done wrong.  She then had another set up at [facility] in march.  And pt is stating she shouldn't be billed for the first MRI due to it was the hospitals mess up.

## 2019-01-28 NOTE — Telephone Encounter (Signed)
I advised pt of this and she stated that [facility] told her that she needed to take it up with Bronson Curb since he sent the order in.  Pt is going to call [facility] back and see if she can get it resolved.

## 2019-01-28 NOTE — Telephone Encounter (Signed)
unfortunately this is something she will have to take up with them, theres nothing we will be able to do about it

## 2019-02-19 ENCOUNTER — Telehealth (INDEPENDENT_AMBULATORY_CARE_PROVIDER_SITE_OTHER): Payer: Self-pay | Admitting: Physician Assistant

## 2019-02-19 MED ORDER — TRAMADOL HCL 50 MG PO TABS: 50.0000 mg | ORAL_TABLET | Freq: Three times a day (TID) | ORAL | 0 refills | Status: DC | PRN

## 2019-02-19 NOTE — Telephone Encounter (Signed)
IC patient.  

## 2019-02-19 NOTE — Telephone Encounter (Signed)
I sent in some tramadol to [facility].  We will need to see her back then next week.  There is no Hydrographic surveyor in the [employer].

## 2019-02-19 NOTE — Telephone Encounter (Signed)
First, did she try asking Dr. Merrilee Seashore office about pain medication since we have referred her there and they have seen her.  From our standpoint the only thing we can provide is some Tylenol 3 or tramadol but we cannot provide this long-term.

## 2019-02-19 NOTE — Telephone Encounter (Signed)
Patient called stating that her wrist is hurting severely and since her surgery has been deemed non-emergent by [facility].  She would like to know if Bronson Curb or someone can prescribe her something for the pain until she can get the surgery.  CB#[phone].  Thank you.

## 2019-02-19 NOTE — Telephone Encounter (Signed)
Please advise 

## 2019-02-19 NOTE — Telephone Encounter (Signed)
I called patient. She is not going to be able to see Dr. Merlyn Lot due to getting a denial for financial assistance. She has been approved for 100% financial assistance through [facility] and would like to be referred to a hand specialist with [facility] if possible. She states that the assistance is only good through June 30, 2019.  Patient has taken tramadol with no complications before. OK for Rx for tramadol? What directions?

## 2019-02-25 ENCOUNTER — Other Ambulatory Visit (INDEPENDENT_AMBULATORY_CARE_PROVIDER_SITE_OTHER): Payer: Self-pay

## 2019-02-25 ENCOUNTER — Other Ambulatory Visit: Payer: Self-pay

## 2019-02-25 ENCOUNTER — Encounter (INDEPENDENT_AMBULATORY_CARE_PROVIDER_SITE_OTHER): Payer: Self-pay | Admitting: Orthopaedic Surgery

## 2019-02-25 ENCOUNTER — Ambulatory Visit (INDEPENDENT_AMBULATORY_CARE_PROVIDER_SITE_OTHER): Payer: Self-pay | Admitting: Orthopaedic Surgery

## 2019-02-25 DIAGNOSIS — M79642 Pain in left hand: Secondary | ICD-10-CM

## 2019-02-25 DIAGNOSIS — S66812A Strain of other specified muscles, fascia and tendons at wrist and hand level, left hand, initial encounter: Secondary | ICD-10-CM | POA: Insufficient documentation

## 2019-02-25 DIAGNOSIS — S66812D Strain of other specified muscles, fascia and tendons at wrist and hand level, left hand, subsequent encounter: Secondary | ICD-10-CM
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
## 2019-03-01 ENCOUNTER — Encounter (INDEPENDENT_AMBULATORY_CARE_PROVIDER_SITE_OTHER): Payer: Self-pay

## 2019-03-01 ENCOUNTER — Telehealth: Payer: Self-pay | Admitting: Radiology

## 2019-03-01 ENCOUNTER — Telehealth (INDEPENDENT_AMBULATORY_CARE_PROVIDER_SITE_OTHER): Payer: Self-pay | Admitting: Orthopaedic Surgery

## 2019-03-01 NOTE — Telephone Encounter (Signed)
That is fine 

## 2019-03-01 NOTE — Telephone Encounter (Signed)
Patient calling to find out the status of her referral to Dr. Janee Morn.  She states that she called Dr Carollee Massed office and they've told her that they have not received it.

## 2019-03-01 NOTE — Telephone Encounter (Signed)
Can you please check on this?

## 2019-03-01 NOTE — Telephone Encounter (Signed)
Patient sent a facebook message to [facility] FB page. I replied back that I would send this info to Rexene Edison and someone will reach out to her and advise.

## 2019-03-01 NOTE — Telephone Encounter (Signed)
Please advise 

## 2019-03-01 NOTE — Telephone Encounter (Signed)
Emailed letter to provided email address

## 2019-03-02 NOTE — Telephone Encounter (Signed)
Spoke with Ladona Ridgel from [employer], when patient called yesterday they had not received referral, it did come through later yesterday evening, and it is currently being reviewed by Dr. Janee Morn, someone from facility should be hopefully be in touch this afternoon or tomorrow once review is done. I will contact patient to let her know.

## 2019-03-02 NOTE — Telephone Encounter (Signed)
From referral notes on 4/27 Regina Daniel has submitted/sent referral to [facility], pending appt

## 2019-03-02 NOTE — Telephone Encounter (Signed)
Patient is aware someone from [facility] will be giving her a call once Dr. Janee Morn is done reviewing referral.

## 2019-03-02 NOTE — Telephone Encounter (Signed)
Do you mind following up with provider/facility she was referred to?  I talked with patient and she said that they are telling her they do not have anything.

## 2019-04-26 ENCOUNTER — Other Ambulatory Visit: Payer: Self-pay

## 2019-04-26 ENCOUNTER — Telehealth: Payer: Self-pay | Admitting: Orthopaedic Surgery

## 2019-04-26 DIAGNOSIS — S66812D Strain of other specified muscles, fascia and tendons at wrist and hand level, left hand, subsequent encounter: Secondary | ICD-10-CM

## 2019-04-26 NOTE — Telephone Encounter (Signed)
Patient called asked if she can be referred to [facility] for surgery. The number to contact patient is [phone]

## 2019-04-26 NOTE — Telephone Encounter (Signed)
Ok to refer to any Copy in [place] to evaluate and treat a thumb extensor tendon rupture (chronic rupture).

## 2019-04-26 NOTE — Telephone Encounter (Signed)
Sent order.

## 2019-07-22 ENCOUNTER — Telehealth: Payer: Self-pay | Admitting: Physician Assistant

## 2019-07-22 NOTE — Telephone Encounter (Signed)
Patient left a voicemail message stating that she mailed out paperwork months ago that needed to be filled out by Artis Delay and she has not heard anything back.  Can you check to see if we received them?  CB#[phone].  Thank you.

## 2019-07-23 ENCOUNTER — Telehealth: Payer: Self-pay | Admitting: Physician Assistant

## 2019-07-23 NOTE — Telephone Encounter (Signed)
Have you seen these forms by chance?

## 2019-07-23 NOTE — Telephone Encounter (Signed)
Patient called. Says her job is threating to reduce her hours if she does not turn in paperwork from [employer]. Her call back number is [phone]

## 2019-07-26 NOTE — Telephone Encounter (Signed)
IC patient to discuss. She said that someone else had already called her about this.

## 2019-07-26 NOTE — Telephone Encounter (Signed)
IC,advised patient that forms would have been forwarded to [employer]. I offered her their ph number and she wanted me to call her back to leave number on her voice mail which I did.

## 2020-01-07 ENCOUNTER — Emergency Department (HOSPITAL_COMMUNITY): Payer: BLUE CROSS/BLUE SHIELD

## 2020-01-07 ENCOUNTER — Emergency Department (HOSPITAL_COMMUNITY)
Admission: EM | Admit: 2020-01-07 | Discharge: 2020-01-07 | Disposition: A | Discharge status: Home / Self Care | Payer: BLUE CROSS/BLUE SHIELD | Attending: Emergency Medicine | Admitting: Emergency Medicine

## 2020-01-07 ENCOUNTER — Other Ambulatory Visit: Payer: Self-pay

## 2020-01-07 ENCOUNTER — Encounter (HOSPITAL_COMMUNITY): Payer: Self-pay | Admitting: Emergency Medicine

## 2020-01-07 DIAGNOSIS — Z79899 Other long term (current) drug therapy: Secondary | ICD-10-CM | POA: Insufficient documentation

## 2020-01-07 DIAGNOSIS — R109 Unspecified abdominal pain: Secondary | ICD-10-CM | POA: Insufficient documentation

## 2020-01-07 DIAGNOSIS — F17210 Nicotine dependence, cigarettes, uncomplicated: Secondary | ICD-10-CM | POA: Diagnosis not present

## 2020-01-07 LAB — COMPREHENSIVE METABOLIC PANEL
ALT: 18 U/L (ref 0–44)
AST: 26 U/L (ref 15–41)
Albumin: 4.1 g/dL (ref 3.5–5.0)
Alkaline Phosphatase: 95 U/L (ref 38–126)
Anion gap: 11 (ref 5–15)
BUN: 14 mg/dL (ref 6–20)
CO2: 21 mmol/L — ABNORMAL LOW (ref 22–32)
Calcium: 9.4 mg/dL (ref 8.9–10.3)
Chloride: 106 mmol/L (ref 98–111)
Creatinine, Ser: 1.21 mg/dL — ABNORMAL HIGH (ref 0.44–1.00)
GFR calc Af Amer: >60 mL/min (ref >60)
GFR calc non Af Amer: 53 mL/min — ABNORMAL LOW (ref >60)
Glucose, Bld: 106 mg/dL — ABNORMAL HIGH (ref 70–99)
Potassium: 4 mmol/L (ref 3.5–5.1)
Sodium: 138 mmol/L (ref 135–145)
Total Bilirubin: 0.6 mg/dL (ref 0.3–1.2)
Total Protein: 6.6 g/dL (ref 6.5–8.1)

## 2020-01-07 LAB — CBC
HCT: 40.7 % (ref 36.0–46.0)
Hemoglobin: 13.6 g/dL (ref 12.0–15.0)
MCH: 31.4 pg (ref 26.0–34.0)
MCHC: 33.4 g/dL (ref 30.0–36.0)
MCV: 94 fL (ref 80.0–100.0)
Platelets: 193 10*3/uL (ref 150–400)
RBC: 4.33 MIL/uL (ref 3.87–5.11)
RDW: 11.9 % (ref 11.5–15.5)
WBC: 5.7 10*3/uL (ref 4.0–10.5)
nRBC: 0 % (ref 0.0–0.2)

## 2020-01-07 LAB — URINALYSIS, ROUTINE W REFLEX MICROSCOPIC
Bilirubin Urine: NEGATIVE
Glucose, UA: NEGATIVE mg/dL
Hgb urine dipstick: NEGATIVE
Ketones, ur: 5 mg/dL — AB
Leukocytes,Ua: NEGATIVE
Nitrite: NEGATIVE
Protein, ur: NEGATIVE mg/dL
Specific Gravity, Urine: 1.031 — ABNORMAL HIGH (ref 1.005–1.030)
pH: 6 (ref 5.0–8.0)

## 2020-01-07 LAB — LIPASE, BLOOD: Lipase: 29 U/L (ref 11–51)

## 2020-01-07 LAB — I-STAT BETA HCG BLOOD, ED (MC, WL, AP ONLY): I-stat hCG, quantitative: 7 m[IU]/mL — ABNORMAL HIGH (ref <5)

## 2020-01-07 MED ORDER — SODIUM CHLORIDE 0.9 % IV SOLN: INTRAVENOUS | Status: DC

## 2020-01-07 MED ORDER — SODIUM CHLORIDE 0.9 % IV BOLUS
1000.0000 mL | Freq: Once | INTRAVENOUS | Status: AC
  Administered 2020-01-07: 1000 mL via INTRAVENOUS

## 2020-01-07 NOTE — ED Notes (Signed)
Pt ambulatory to bathroom, refuses wheelchair.

## 2020-01-07 NOTE — ED Notes (Signed)
Pt resting quietly with eyes closed.  No complaints voiced.  Lights turned down

## 2020-01-07 NOTE — ED Notes (Signed)
Pt st's her blood pressure is always low and normally runs in the 90's

## 2020-01-07 NOTE — ED Provider Notes (Signed)
[facility] EMERGENCY DEPARTMENT Provider Note   CSN: [id number] Arrival date & time: 01/07/20  1724     History Chief Complaint  Patient presents with  . Flank Pain    Regina Daniel is a 49 y.o. female.  49 year old female presents with concern for UTI.  Seen by her doctor 2 days ago for similar symptoms and was prescribed Bactrim.  She said that that time her urine was negative but her doctor still decided to put her on some antibiotics.  Does have history of pyelonephritis and states that this feels similar.  Endorses emesis x1 but no fever or chills.  Does have flank pain bilateral radiating to her groin severe denies any vaginal bleeding or discharge.  States she has been compliant with her antibiotics.        Past Medical History:  Diagnosis Date  . Drug abuse in remission (HCC)   . Left wrist fracture JAN 6.2017  . Ruptured extensor tendon of hand or wrist   . Tobacco dependence     Patient Active Problem List   Diagnosis Date Noted  . Rupture of extensor tendons of left hand and wrist 02/25/2019  . Pyelonephritis 12/20/2017  . Acute pain of left shoulder 01/23/2017  . 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 Distal radius fracture 03/29/2016    Past Surgical History:  Procedure Laterality Date  . CHOLECYSTECTOMY    . NASAL SEPTUM SURGERY  MARCH 2017  . ORIF WRIST FRACTURE Left 03/29/2016   Procedure: LEFT WRIST OSTEOTOMY WITH OPEN REDUCTION INTERNAL FIXATION (ORIF) ;  Surgeon: Kathryne Hitch, MD;  Location: [facility];  Service: Orthopedics;  Laterality: Left;  Left Intra-clavicular block  . TUBAL LIGATION    . WRIST SURGERY Right YEARS AGO   TENDON REPAIR     OB History   No obstetric history on file.     No family history on file.  Social History   Tobacco Use  . Smoking status: Current Every Day Smoker    Packs/day: 0.50    Years: 32.00    Pack years: 16.00    Types:  Cigarettes  . Smokeless tobacco: Never Used  . Tobacco comment: USING E CIGARETTE NOW  Substance Use Topics  . Alcohol use: No  . Drug use: No    Types: Cocaine    Comment: last used 04-15-15    Home Medications Prior to Admission medications   Medication Sig Start Date End Date Taking? Authorizing Provider  celecoxib (CELEBREX) 200 MG capsule Take 200 mg by mouth 2 (two) times daily.    Alver Fisher, RN  diphenhydrAMINE (BENADRYL) 25 MG tablet Take 25 mg by mouth every 6 (six) hours as needed.    Alver Fisher, RN  traMADol (ULTRAM) 50 MG tablet Take 1-2 tablets (50-100 mg total) by mouth 3 (three) times daily as needed for moderate pain. 02/19/19   Kathryne Hitch, MD    Allergies    Patient has no known allergies.  Review of Systems   Review of Systems  All other systems reviewed and are negative.   Physical Exam Updated Vital Signs BP 100/67 (BP Location: Right Arm)   Pulse 73   Temp 98.2 F (36.8 C) (Oral)   Resp 16   Ht 1.575 m (5\' 2" )   Wt 49.9 kg   LMP 11/04/2010   SpO2 100%   BMI 20.12 kg/m   Physical Exam Vitals and  nursing note reviewed.  Constitutional:      General: She is not in acute distress.    Appearance: Normal appearance. She is well-developed. She is not toxic-appearing.  HENT:     Head: Normocephalic and atraumatic.  Eyes:     General: Lids are normal.     Conjunctiva/sclera: Conjunctivae normal.     Pupils: Pupils are equal, round, and reactive to light.  Neck:     Thyroid: No thyroid mass.     Trachea: No tracheal deviation.  Cardiovascular:     Rate and Rhythm: Normal rate and regular rhythm.     Heart sounds: Normal heart sounds. No murmur. No gallop.   Pulmonary:     Effort: Pulmonary effort is normal. No respiratory distress.     Breath sounds: Normal breath sounds. No stridor. No decreased breath sounds, wheezing, rhonchi or rales.  Abdominal:     General: Bowel sounds are normal. There is no distension.      Palpations: Abdomen is soft.     Tenderness: There is no abdominal tenderness. There is no right CVA tenderness or rebound.  Musculoskeletal:        General: No tenderness. Normal range of motion.     Cervical back: Normal range of motion and neck supple.  Skin:    General: Skin is warm and dry.     Findings: No abrasion or rash.  Neurological:     Mental Status: She is alert and oriented to person, place, and time.     GCS: GCS eye subscore is 4. GCS verbal subscore is 5. GCS motor subscore is 6.     Cranial Nerves: No cranial nerve deficit.     Sensory: No sensory deficit.  Psychiatric:        Speech: Speech normal.        Behavior: Behavior normal.     ED Results / Procedures / Treatments   Labs (all labs ordered are listed, but only abnormal results are displayed) Labs Reviewed  I-STAT BETA HCG BLOOD, ED ([facility], [facility], [facility] ONLY) - Abnormal; Notable for the following components:      Result Value   I-stat hCG, quantitative 7.0 (*)    All other components within normal limits  URINE CULTURE  CBC  LIPASE, BLOOD  COMPREHENSIVE METABOLIC PANEL  URINALYSIS, ROUTINE W REFLEX MICROSCOPIC    EKG None  Radiology No results found.  Procedures Procedures (including critical care time)  Medications Ordered in ED Medications  sodium chloride 0.9 % bolus 1,000 mL (has no administration in time range)  0.9 %  sodium chloride infusion (has no administration in time range)    ED Course  I have reviewed the triage vital signs and the nursing notes.  Pertinent labs & imaging results that were available during my care of the patient were reviewed by me and considered in my medical decision making (see chart for details).    MDM Rules/Calculators/A&P                      Patient's urinalysis without evidence of infection here.  CT for kidney stones also negative.  Patient stable for discharge Final Clinical Impression(s) / ED Diagnoses Final diagnoses:  None    Rx / DC Orders  ED Discharge Orders    None       Lacretia Leigh, MD 01/07/20 2059

## 2020-01-07 NOTE — ED Notes (Signed)
Patient transported to CT 

## 2020-01-07 NOTE — ED Triage Notes (Signed)
Pt states she thinks she has a kidney infection. Reports bilateral flank pain that radiates to abd. Reports a normal UA at PCP but was still started on Sulfa. States she is weaker than normal and vomited today.

## 2020-01-08 LAB — URINE CULTURE: Culture: <10000 — AB

## 2020-01-13 ENCOUNTER — Emergency Department (HOSPITAL_COMMUNITY): Payer: BLUE CROSS/BLUE SHIELD

## 2020-01-13 ENCOUNTER — Encounter (HOSPITAL_COMMUNITY): Payer: Self-pay | Admitting: Emergency Medicine

## 2020-01-13 ENCOUNTER — Other Ambulatory Visit: Payer: Self-pay

## 2020-01-13 ENCOUNTER — Emergency Department (HOSPITAL_COMMUNITY)
Admission: EM | Admit: 2020-01-13 | Discharge: 2020-01-13 | Disposition: A | Discharge status: Home / Self Care | Payer: BLUE CROSS/BLUE SHIELD | Attending: Emergency Medicine | Admitting: Emergency Medicine

## 2020-01-13 DIAGNOSIS — F17210 Nicotine dependence, cigarettes, uncomplicated: Secondary | ICD-10-CM | POA: Insufficient documentation

## 2020-01-13 DIAGNOSIS — Z79899 Other long term (current) drug therapy: Secondary | ICD-10-CM | POA: Diagnosis not present

## 2020-01-13 DIAGNOSIS — F1490 Cocaine use, unspecified, uncomplicated: Secondary | ICD-10-CM | POA: Insufficient documentation

## 2020-01-13 DIAGNOSIS — E860 Dehydration: Secondary | ICD-10-CM | POA: Diagnosis not present

## 2020-01-13 DIAGNOSIS — Z9049 Acquired absence of other specified parts of digestive tract: Secondary | ICD-10-CM | POA: Diagnosis not present

## 2020-01-13 DIAGNOSIS — R112 Nausea with vomiting, unspecified: Secondary | ICD-10-CM | POA: Diagnosis present

## 2020-01-13 DIAGNOSIS — Z20822 Contact with and (suspected) exposure to covid-19: Secondary | ICD-10-CM | POA: Diagnosis not present

## 2020-01-13 LAB — POC SARS CORONAVIRUS 2 AG -  ED: SARS Coronavirus 2 Ag: NEGATIVE

## 2020-01-13 LAB — COMPREHENSIVE METABOLIC PANEL
ALT: 21 U/L (ref 0–44)
AST: 26 U/L (ref 15–41)
Albumin: 5.5 g/dL — ABNORMAL HIGH (ref 3.5–5.0)
Alkaline Phosphatase: 108 U/L (ref 38–126)
Anion gap: 17 — ABNORMAL HIGH (ref 5–15)
BUN: 26 mg/dL — ABNORMAL HIGH (ref 6–20)
CO2: 25 mmol/L (ref 22–32)
Calcium: 10.4 mg/dL — ABNORMAL HIGH (ref 8.9–10.3)
Chloride: 96 mmol/L — ABNORMAL LOW (ref 98–111)
Creatinine, Ser: 1.04 mg/dL — ABNORMAL HIGH (ref 0.44–1.00)
GFR calc Af Amer: >60 mL/min (ref >60)
GFR calc non Af Amer: >60 mL/min (ref >60)
Glucose, Bld: 90 mg/dL (ref 70–99)
Potassium: 3.3 mmol/L — ABNORMAL LOW (ref 3.5–5.1)
Sodium: 138 mmol/L (ref 135–145)
Total Bilirubin: 1.2 mg/dL (ref 0.3–1.2)
Total Protein: 8.8 g/dL — ABNORMAL HIGH (ref 6.5–8.1)

## 2020-01-13 LAB — RAPID URINE DRUG SCREEN, HOSP PERFORMED
Amphetamines: NOT DETECTED
Barbiturates: NOT DETECTED
Benzodiazepines: NOT DETECTED
Cocaine: POSITIVE — AB
Opiates: NOT DETECTED
Tetrahydrocannabinol: NOT DETECTED

## 2020-01-13 LAB — URINALYSIS, ROUTINE W REFLEX MICROSCOPIC
Bacteria, UA: NONE SEEN
Bilirubin Urine: NEGATIVE
Glucose, UA: NEGATIVE mg/dL
Hgb urine dipstick: NEGATIVE
Ketones, ur: 80 mg/dL — AB
Leukocytes,Ua: NEGATIVE
Nitrite: NEGATIVE
Protein, ur: 30 mg/dL — AB
Specific Gravity, Urine: 1.031 — ABNORMAL HIGH (ref 1.005–1.030)
pH: 7 (ref 5.0–8.0)

## 2020-01-13 LAB — CBC WITH DIFFERENTIAL/PLATELET
Abs Immature Granulocytes: 0.03 10*3/uL (ref 0.00–0.07)
Basophils Absolute: 0 10*3/uL (ref 0.0–0.1)
Basophils Relative: 0 %
Eosinophils Absolute: 0.2 10*3/uL (ref 0.0–0.5)
Eosinophils Relative: 2 %
HCT: 49.8 % — ABNORMAL HIGH (ref 36.0–46.0)
Hemoglobin: 17.1 g/dL — ABNORMAL HIGH (ref 12.0–15.0)
Immature Granulocytes: 0 %
Lymphocytes Relative: 22 %
Lymphs Abs: 2.2 10*3/uL (ref 0.7–4.0)
MCH: 31.4 pg (ref 26.0–34.0)
MCHC: 34.3 g/dL (ref 30.0–36.0)
MCV: 91.5 fL (ref 80.0–100.0)
Monocytes Absolute: 0.7 10*3/uL (ref 0.1–1.0)
Monocytes Relative: 7 %
Neutro Abs: 6.9 10*3/uL (ref 1.7–7.7)
Neutrophils Relative %: 69 %
Platelets: 269 10*3/uL (ref 150–400)
RBC: 5.44 MIL/uL — ABNORMAL HIGH (ref 3.87–5.11)
RDW: 11.9 % (ref 11.5–15.5)
WBC: 10 10*3/uL (ref 4.0–10.5)
nRBC: 0 % (ref 0.0–0.2)

## 2020-01-13 LAB — LIPASE, BLOOD: Lipase: 25 U/L (ref 11–51)

## 2020-01-13 LAB — SARS CORONAVIRUS 2 (TAT 6-24 HRS): SARS Coronavirus 2: NEGATIVE

## 2020-01-13 MED ORDER — SODIUM CHLORIDE 0.9 % IV BOLUS
1000.0000 mL | Freq: Once | INTRAVENOUS | Status: AC
  Administered 2020-01-13: 1000 mL via INTRAVENOUS

## 2020-01-13 MED ORDER — ONDANSETRON HCL 4 MG/2ML IJ SOLN
4.0000 mg | Freq: Once | INTRAMUSCULAR | Status: AC
  Administered 2020-01-13: 4 mg via INTRAVENOUS
  Filled 2020-01-13: qty 2

## 2020-01-13 MED ORDER — MORPHINE SULFATE (PF) 4 MG/ML IV SOLN
4.0000 mg | Freq: Once | INTRAVENOUS | Status: AC
  Administered 2020-01-13: 4 mg via INTRAVENOUS
  Filled 2020-01-13: qty 1

## 2020-01-13 MED ORDER — ONDANSETRON 4 MG PO TBDP: 4.0000 mg | ORAL_TABLET | Freq: Three times a day (TID) | ORAL | 0 refills | Status: DC | PRN

## 2020-01-13 NOTE — ED Triage Notes (Signed)
Patient is [place] [facility] [place] and complains of nausea and vomiting for the past 3 days. She has had SOB and muscle aches the past 10 days.     EMS vitals: 97-99% O2 sat on room air 112/76 BP 86 HR 22 Resp Rate 108 CBG 99.1 Temp

## 2020-01-13 NOTE — ED Notes (Signed)
Pt is tolerating PO fluids. Pt now requesting a coke. Pt given coke at this time.

## 2020-01-13 NOTE — ED Provider Notes (Addendum)
[facility]-EMERGENCY DEPT Provider Note   CSN: [id number] Arrival date & time: 01/13/20  0934     History Chief Complaint  Patient presents with  . Emesis  . Nausea  . Shortness of Breath  . Generalized Body Aches    Regina Daniel is a 49 y.o. female.  Pt presents to the ED today with n/v for 3 days.  She has also had some muscle aches and sob intermittently.  The pt denies any f/c.  No known covid exposures. She was here on 3/5 for abdominal pain and had a nl CT renal.        Past Medical History:  Diagnosis Date  . Drug abuse in remission (HCC)   . Left wrist fracture JAN 6.2017  . Ruptured extensor tendon of hand or wrist   . Tobacco dependence     Patient Active Problem List   Diagnosis Date Noted  . Rupture of extensor tendons of left hand and wrist 02/25/2019  . Pyelonephritis 12/20/2017  . Acute pain of left shoulder 01/23/2017  . 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 Distal radius fracture 03/29/2016    Past Surgical History:  Procedure Laterality Date  . CHOLECYSTECTOMY    . NASAL SEPTUM SURGERY  MARCH 2017  . ORIF WRIST FRACTURE Left 03/29/2016   Procedure: LEFT WRIST OSTEOTOMY WITH OPEN REDUCTION INTERNAL FIXATION (ORIF) ;  Surgeon: Kathryne Hitch, MD;  Location: [facility];  Service: Orthopedics;  Laterality: Left;  Left Intra-clavicular block  . TUBAL LIGATION    . WRIST SURGERY Right YEARS AGO   TENDON REPAIR     OB History   No obstetric history on file.     History reviewed. No pertinent family history.  Social History   Tobacco Use  . Smoking status: Current Every Day Smoker    Packs/day: 0.50    Years: 32.00    Pack years: 16.00    Types: Cigarettes  . Smokeless tobacco: Never Used  . Tobacco comment: USING E CIGARETTE NOW  Substance Use Topics  . Alcohol use: No  . Drug use: No    Types: Cocaine    Comment: last used 04-15-15    Home  Medications Prior to Admission medications   Medication Sig Start Date End Date Taking? Authorizing Provider  celecoxib (CELEBREX) 200 MG capsule Take 200 mg by mouth 2 (two) times daily.    Alver Fisher, RN  diphenhydrAMINE (BENADRYL) 25 MG tablet Take 25 mg by mouth every 6 (six) hours as needed.    Alver Fisher, RN  ondansetron (ZOFRAN ODT) 4 MG disintegrating tablet Take 1 tablet (4 mg total) by mouth every 8 (eight) hours as needed. 01/13/20   Jacalyn Lefevre, MD  ondansetron (ZOFRAN) 8 MG tablet Take 8 mg by mouth 3 (three) times daily. 01/04/20   [provider]  sulfamethoxazole-trimethoprim (BACTRIM DS) 800-160 MG tablet Take 1 tablet by mouth 2 (two) times daily. 7 day course 01/04/20   [provider]  traMADol (ULTRAM) 50 MG tablet Take 1-2 tablets (50-100 mg total) by mouth 3 (three) times daily as needed for moderate pain. 02/19/19   Kathryne Hitch, MD    Allergies    Patient has no known allergies.  Review of Systems   Review of Systems  Gastrointestinal: Positive for abdominal pain, nausea and vomiting.  All other systems reviewed and are negative.   Physical Exam Updated Vital Signs BP  97/68   Pulse 73   Temp 98.9 F (37.2 C) (Oral)   Resp 11   Ht 5\' 2"  (1.575 m)   Wt 48.5 kg   LMP 11/04/2010   SpO2 98%   BMI 19.57 kg/m   Physical Exam Vitals and nursing note reviewed.  Constitutional:      Appearance: Normal appearance.  HENT:     Head: Normocephalic and atraumatic.     Right Ear: External ear normal.     Left Ear: External ear normal.     Nose: Nose normal.     Mouth/Throat:     Mouth: Mucous membranes are dry.  Eyes:     Extraocular Movements: Extraocular movements intact.     Conjunctiva/sclera: Conjunctivae normal.     Pupils: Pupils are equal, round, and reactive to light.  Cardiovascular:     Rate and Rhythm: Normal rate and regular rhythm.     Pulses: Normal pulses.     Heart sounds: Normal heart sounds.   Pulmonary:     Effort: Pulmonary effort is normal.     Breath sounds: Normal breath sounds.  Abdominal:     General: Abdomen is flat. Bowel sounds are normal.     Palpations: Abdomen is soft.  Musculoskeletal:        General: Normal range of motion.     Cervical back: Normal range of motion and neck supple.  Skin:    General: Skin is warm.     Capillary Refill: Capillary refill takes less than 2 seconds.  Neurological:     General: No focal deficit present.     Mental Status: She is alert and oriented to person, place, and time.  Psychiatric:        Mood and Affect: Mood normal.        Behavior: Behavior normal.     ED Results / Procedures / Treatments   Labs (all labs ordered are listed, but only abnormal results are displayed) Labs Reviewed  CBC WITH DIFFERENTIAL/PLATELET - Abnormal; Notable for the following components:      Result Value   RBC 5.44 (*)    Hemoglobin 17.1 (*)    HCT 49.8 (*)    All other components within normal limits  COMPREHENSIVE METABOLIC PANEL - Abnormal; Notable for the following components:   Potassium 3.3 (*)    Chloride 96 (*)    BUN 26 (*)    Creatinine, Ser 1.04 (*)    Calcium 10.4 (*)    Total Protein 8.8 (*)    Albumin 5.5 (*)    Anion gap 17 (*)    All other components within normal limits  URINALYSIS, ROUTINE W REFLEX MICROSCOPIC - Abnormal; Notable for the following components:   Specific Gravity, Urine 1.031 (*)    Ketones, ur 80 (*)    Protein, ur 30 (*)    All other components within normal limits  RAPID URINE DRUG SCREEN, HOSP PERFORMED - Abnormal; Notable for the following components:   Cocaine POSITIVE (*)    All other components within normal limits  SARS CORONAVIRUS 2 (TAT 6-24 HRS)  LIPASE, BLOOD  POC SARS CORONAVIRUS 2 AG -  ED    EKG EKG Interpretation  Date/Time:  Thursday January 13 2020 09:51:47 EST Ventricular Rate:  76 PR Interval:    QRS Duration: 89 QT Interval:  413 QTC Calculation: 465 R  Axis:   67 Text Interpretation: Sinus rhythm Borderline repolarization abnormality Confirmed by Isla Pence [phone]) on 01/13/2020 1:02:44 PM  Radiology DG Chest Portable 1 View  Result Date: 01/13/2020 CLINICAL DATA:  Shortness of breath EXAM: PORTABLE CHEST 1 VIEW COMPARISON:  01/02/2011 FINDINGS: Cardiomediastinal contours and hilar structures are normal. Lungs are clear. No signs of pleural effusion. Visualized skeletal structures are unremarkable. IMPRESSION: No acute cardiopulmonary disease. Electronically Signed   By: Donzetta Kohut M.D.   On: 01/13/2020 10:08    Procedures Procedures (including critical care time)  Medications Ordered in ED Medications  sodium chloride 0.9 % bolus 1,000 mL (1,000 mLs Intravenous New Bag/Given 01/13/20 1041)  ondansetron (ZOFRAN) injection 4 mg (4 mg Intravenous Given 01/13/20 1042)  morphine 4 MG/ML injection 4 mg (4 mg Intravenous Given 01/13/20 1042)    ED Course  I have reviewed the triage vital signs and the nursing notes.  Pertinent labs & imaging results that were available during my care of the patient were reviewed by me and considered in my medical decision making (see chart for details).    MDM Rules/Calculators/A&P                     Pt is feeling better. Labs are reassuring.  She is dehydrated, but is able to tolerate po fluids now.  POC Covid negative.  PCR Covid test pending.  Pt is stable for d/c.  Return if worse.  Final Clinical Impression(s) / ED Diagnoses Final diagnoses:  Non-intractable vomiting with nausea, unspecified vomiting type  Dehydration  Cocaine use    Rx / DC Orders ED Discharge Orders         Ordered    ondansetron (ZOFRAN ODT) 4 MG disintegrating tablet  Every 8 hours PRN     01/13/20 1259           Jacalyn Lefevre, MD 01/13/20 1300    Jacalyn Lefevre, MD 01/13/20 1303

## 2020-01-13 NOTE — ED Notes (Signed)
Patient given sprite.

## 2020-06-02 ENCOUNTER — Emergency Department (HOSPITAL_COMMUNITY)
Admission: EM | Admit: 2020-06-02 | Discharge: 2020-06-02 | Disposition: A | Discharge status: Home / Self Care | Payer: BLUE CROSS/BLUE SHIELD | Attending: Emergency Medicine | Admitting: Emergency Medicine

## 2020-06-02 ENCOUNTER — Encounter (HOSPITAL_COMMUNITY): Payer: Self-pay | Admitting: Emergency Medicine

## 2020-06-02 DIAGNOSIS — K4090 Unilateral inguinal hernia, without obstruction or gangrene, not specified as recurrent: Secondary | ICD-10-CM

## 2020-06-02 DIAGNOSIS — R05 Cough: Secondary | ICD-10-CM | POA: Insufficient documentation

## 2020-06-02 DIAGNOSIS — F17210 Nicotine dependence, cigarettes, uncomplicated: Secondary | ICD-10-CM | POA: Insufficient documentation

## 2020-06-02 NOTE — ED Provider Notes (Signed)
[facility] EMERGENCY DEPARTMENT Provider Note   CSN: [id number] Arrival date & time: 06/02/20  0825     History Chief Complaint  Patient presents with  . Hernia  . Abdominal Pain    Regina Daniel is a 49 y.o. female.  Patient is a 49 year old female that presents today after noting a bulge in her right inguinal area when in the shower about 2 days ago.  Patient states that she does not know she has had this before than but cannot rule that out.  She states that she was showering on Wednesday and noted a bulge in that area.  She also noted some discomfort on palpation to it.  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  She presents emergency department today because she knows there are limitations on recommended amounts of lifting while a hernia is present.  She denies symptoms of strangulation with a hernia, no chills, fevers, or worsening pain.  She states that hernia does seem to reduce well.  On further discussion patient states that there is a chance this hernia may have been present for a longer period of time, but she states she did not notice discomfort in the region until recently.          Past Medical History:  Diagnosis Date  . Drug abuse in remission (HCC)   . Left wrist fracture JAN 6.2017  . Ruptured extensor tendon of hand or wrist   . Tobacco dependence     Patient Active Problem List   Diagnosis Date Noted  . Rupture of extensor tendons of left hand and wrist 02/25/2019  . Pyelonephritis 12/20/2017  . Acute pain of left shoulder 01/23/2017  . 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 Distal radius fracture 03/29/2016    Past Surgical History:  Procedure Laterality Date  . CHOLECYSTECTOMY    . NASAL SEPTUM SURGERY  MARCH 2017  . ORIF WRIST FRACTURE Left 03/29/2016   Procedure: LEFT WRIST OSTEOTOMY WITH OPEN REDUCTION  INTERNAL FIXATION (ORIF) ;  Surgeon: Kathryne Hitch, MD;  Location: [facility];  Service: Orthopedics;  Laterality: Left;  Left Intra-clavicular block  . TUBAL LIGATION    . WRIST SURGERY Right YEARS AGO   TENDON REPAIR     OB History   No obstetric history on file.     No family history on file.  Social History   Tobacco Use  . Smoking status: Current Every Day Smoker    Packs/day: 0.50    Years: 32.00    Pack years: 16.00    Types: Cigarettes  . Smokeless tobacco: Never Used  . Tobacco comment: USING E CIGARETTE NOW  Vaping Use  . Vaping Use: Former  Substance Use Topics  . Alcohol use: No  . Drug use: No    Types: Cocaine    Comment: last used 04-15-15    Home Medications Prior to Admission medications   Medication Sig Start Date End Date Taking? Authorizing Provider  celecoxib (CELEBREX) 200 MG capsule Take 200 mg by mouth 2 (two) times daily.    Alver Fisher, RN  diphenhydrAMINE (BENADRYL) 25 MG tablet Take 25 mg by mouth every 6 (six) hours as needed.    Alver Fisher, RN  ondansetron (ZOFRAN ODT) 4 MG disintegrating tablet Take 1 tablet (4 mg total) by mouth every 8 (eight)  hours as needed. 01/13/20   Jacalyn Lefevre, MD  ondansetron (ZOFRAN) 8 MG tablet Take 8 mg by mouth 3 (three) times daily. 01/04/20   [provider]  sulfamethoxazole-trimethoprim (BACTRIM DS) 800-160 MG tablet Take 1 tablet by mouth 2 (two) times daily. 7 day course 01/04/20   [provider]  traMADol (ULTRAM) 50 MG tablet Take 1-2 tablets (50-100 mg total) by mouth 3 (three) times daily as needed for moderate pain. 02/19/19   Kathryne Hitch, MD    Allergies    Patient has no known allergies.  Review of Systems   Review of Systems  Constitutional: Negative for chills and fever.  HENT: Negative for congestion.   Eyes: Negative for visual disturbance.  Respiratory: Positive for cough. Negative for chest tightness and shortness of breath.   Cardiovascular:  Negative for chest pain.  Gastrointestinal: Positive for abdominal pain (at site of hernia). Negative for diarrhea, nausea and vomiting.  Genitourinary: Negative for dysuria.  Neurological: Negative for headaches.    Physical Exam Updated Vital Signs BP 106/66   Pulse 75   Temp 98.2 F (36.8 C)   Resp 16   Ht 5\' 2"  (1.575 m)   Wt 48.5 kg   LMP 11/04/2010   SpO2 97%   BMI 19.57 kg/m   Physical Exam Constitutional:      General: She is not in acute distress. HENT:     Head: Normocephalic and atraumatic.     Mouth/Throat:     Mouth: Mucous membranes are moist.  Eyes:     Extraocular Movements: Extraocular movements intact.  Cardiovascular:     Rate and Rhythm: Normal rate and regular rhythm.     Heart sounds: Normal heart sounds.  Pulmonary:     Effort: Pulmonary effort is normal.     Breath sounds: Normal breath sounds.  Abdominal:     General: Abdomen is flat. Bowel sounds are normal.     Palpations: Abdomen is soft.     Hernia: A hernia is present. Hernia is present in the right inguinal area (some tenderness to palpation but reduces well).  Skin:    General: Skin is warm and dry.  Neurological:     General: No focal deficit present.     Mental Status: She is alert.  Psychiatric:        Mood and Affect: Mood normal.        Behavior: Behavior normal.     ED Results / Procedures / Treatments   Labs (all labs ordered are listed, but only abnormal results are displayed) Labs Reviewed  URINALYSIS, ROUTINE W REFLEX MICROSCOPIC    EKG None  Radiology No results found.  Procedures Procedures (including critical care time)  Medications Ordered in ED Medications - No data to display  ED Course  I have reviewed the triage vital signs and the nursing notes.  Pertinent labs & imaging results that were available during my care of the patient were reviewed by me and considered in my medical decision making (see chart for details).    MDM  Rules/Calculators/A&P                          49 year old female with right inguinal hernia without signs of strangulation.  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  Patient without signs of strangulation, hernia easily reducible, no chills, fevers, nausea, vomiting.  Discussed with patient and determined that patient would be safe to discharge home with follow-up ordered via ambulatory referral to General surgery.  Recommended patient minimize bending over and minimize lifting weights greater than 20 pounds until evaluated by surgery.  Return precautions provided to patient prior to discharge.   Final Clinical Impression(s) / ED Diagnoses Final diagnoses:  Unilateral inguinal hernia without obstruction or gangrene, recurrence not specified    Rx / DC Orders ED Discharge Orders         Ordered    Ambulatory referral to General Surgery     Discontinue  Reprint    Comments: Inguinal hernia   06/02/20 1415           Jackelyn Poling, DO 06/02/20 1417    Cathren Laine, MD 06/02/20 1419

## 2020-06-02 NOTE — ED Triage Notes (Signed)
Pt. Stated, I noticed I was taking a SHOWER AND SAW A BULGE ON MY RT. SIDE AND IT HURTS WHEN I move or cough,.

## 2020-06-02 NOTE — Discharge Instructions (Addendum)
You were evaluated in the emergency department due to a right inguinal hernia.  In the emergency department we determined that your hernia was not "incarcerated" and that you can be safely discharged with recommended follow-up for outpatient surgery to have the hernia repaired.  I have placed the referral for surgery, they should contact you in the next week. If they do not I recommend you follow up with your PCP to ensure there was no issue with the referral.  Please return if you develop symptoms of worsening pain that is not resolved in the area of the hernia, nausea, vomiting, chills/fevers, or if the hernia bulges out and does not "reduce".   I recommend you do not lift more than 20lbs until evaluated by surgery at which point they will make recommendations.

## 2020-06-09 ENCOUNTER — Other Ambulatory Visit: Payer: Self-pay

## 2020-06-09 ENCOUNTER — Encounter (HOSPITAL_COMMUNITY): Payer: Self-pay | Admitting: Emergency Medicine

## 2020-06-09 ENCOUNTER — Emergency Department (HOSPITAL_COMMUNITY)
Admission: EM | Admit: 2020-06-09 | Discharge: 2020-06-09 | Discharge status: Against Medical Advice | Payer: BLUE CROSS/BLUE SHIELD | Attending: Emergency Medicine | Admitting: Emergency Medicine

## 2020-06-09 DIAGNOSIS — F17210 Nicotine dependence, cigarettes, uncomplicated: Secondary | ICD-10-CM | POA: Insufficient documentation

## 2020-06-09 DIAGNOSIS — K469 Unspecified abdominal hernia without obstruction or gangrene: Secondary | ICD-10-CM | POA: Insufficient documentation

## 2020-06-09 DIAGNOSIS — R109 Unspecified abdominal pain: Secondary | ICD-10-CM

## 2020-06-09 MED ORDER — KETOROLAC TROMETHAMINE 15 MG/ML IJ SOLN: 15.0000 mg | Freq: Once | INTRAMUSCULAR | Status: DC

## 2020-06-09 MED ORDER — MORPHINE SULFATE (PF) 2 MG/ML IV SOLN: 2.0000 mg | Freq: Once | INTRAVENOUS | Status: DC

## 2020-06-09 NOTE — ED Provider Notes (Signed)
[facility] EMERGENCY DEPARTMENT Provider Note   CSN: [id number] Arrival date & time: 06/09/20  1200     History Chief Complaint  Patient presents with  . Hernia    Regina Daniel is a 49 y.o. female.  HPI   49 year old female with abdominal pain.  Recently seen in the emergency room for right inguinal hernia.  She has been having increasing pain which she attributes to overexerting herself/straining at work.  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  They are requiring her to stand for long periods of time and also doing heavy lifting. Her pain has been worsening and now also additional pain in lower/mid abdomen. No n/v or distension. Hernia continues to reduce spontaneously when relaxed/not straining.   Past Medical History:  Diagnosis Date  . Drug abuse in remission (HCC)   . Left wrist fracture JAN 6.2017  . Ruptured extensor tendon of hand or wrist   . Tobacco dependence     Patient Active Problem List   Diagnosis Date Noted  . Rupture of extensor tendons of left hand and wrist 02/25/2019  . Pyelonephritis 12/20/2017  . Acute pain of left shoulder 01/23/2017  . 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 Distal radius fracture 03/29/2016    Past Surgical History:  Procedure Laterality Date  . CHOLECYSTECTOMY    . NASAL SEPTUM SURGERY  MARCH 2017  . ORIF WRIST FRACTURE Left 03/29/2016   Procedure: LEFT WRIST OSTEOTOMY WITH OPEN REDUCTION INTERNAL FIXATION (ORIF) ;  Surgeon: Kathryne Hitch, MD;  Location: [facility];  Service: Orthopedics;  Laterality: Left;  Left Intra-clavicular block  . TUBAL LIGATION    . WRIST SURGERY Right YEARS AGO   TENDON REPAIR     OB History   No obstetric history on file.     No family history on file.  Social History   Tobacco Use  . Smoking status: Current Every Day Smoker    Packs/day: 0.50    Years: 32.00    Pack years: 16.00     Types: Cigarettes  . Smokeless tobacco: Never Used  . Tobacco comment: USING E CIGARETTE NOW  Vaping Use  . Vaping Use: Former  Substance Use Topics  . Alcohol use: No  . Drug use: No    Types: Cocaine    Comment: last used 04-15-15    Home Medications Prior to Admission medications   Medication Sig Start Date End Date Taking? Authorizing Provider  celecoxib (CELEBREX) 200 MG capsule Take 200 mg by mouth 2 (two) times daily.    Alver Fisher, RN  diphenhydrAMINE (BENADRYL) 25 MG tablet Take 25 mg by mouth every 6 (six) hours as needed.    Alver Fisher, RN  ondansetron (ZOFRAN ODT) 4 MG disintegrating tablet Take 1 tablet (4 mg total) by mouth every 8 (eight) hours as needed. 01/13/20   Jacalyn Lefevre, MD  ondansetron (ZOFRAN) 8 MG tablet Take 8 mg by mouth 3 (three) times daily. 01/04/20   [provider]  sulfamethoxazole-trimethoprim (BACTRIM DS) 800-160 MG tablet Take 1 tablet by mouth 2 (two) times daily. 7 day course 01/04/20   [provider]  traMADol (ULTRAM) 50 MG tablet Take 1-2 tablets (50-100 mg total) by mouth 3 (three) times daily as needed for moderate pain. 02/19/19   Kathryne Hitch, MD    Allergies    Patient has no known  allergies.  Review of Systems   Review of Systems All systems reviewed and negative, other than as noted in HPI.  Physical Exam Updated Vital Signs BP (!) 91/48 (BP Location: Left Arm)   Pulse 72   Temp 98.2 F (36.8 C)   Resp 14   LMP 11/04/2010   SpO2 100%   Physical Exam Vitals and nursing note reviewed.  Constitutional:      General: She is not in acute distress.    Appearance: She is well-developed.  HENT:     Head: Normocephalic and atraumatic.  Eyes:     General:        Right eye: No discharge.        Left eye: No discharge.     Conjunctiva/sclera: Conjunctivae normal.  Cardiovascular:     Rate and Rhythm: Normal rate and regular rhythm.     Heart sounds: Normal heart sounds. No murmur heard.   No friction rub. No gallop.   Pulmonary:     Effort: Pulmonary effort is normal. No respiratory distress.     Breath sounds: Normal breath sounds.  Abdominal:     General: There is no distension.     Palpations: Abdomen is soft.     Tenderness: There is abdominal tenderness.     Comments: TTP lower abdomen. Fullness R inguinal region with coughing.   Musculoskeletal:        General: No tenderness.     Cervical back: Neck supple.  Skin:    General: Skin is warm and dry.  Neurological:     Mental Status: She is alert.  Psychiatric:        Behavior: Behavior normal.        Thought Content: Thought content normal.     ED Results / Procedures / Treatments   Labs (all labs ordered are listed, but only abnormal results are displayed) Labs Reviewed - No data to display  EKG None  Radiology No results found.  Procedures Procedures (including critical care time)  Medications Ordered in ED Medications - No data to display  ED Course  I have reviewed the triage vital signs and the nursing notes.  Pertinent labs & imaging results that were available during my care of the patient were reviewed by me and considered in my medical decision making (see chart for details).    MDM Rules/Calculators/A&P                          48yF with increasing abdominal pain. Probably from continued strain in setting of inguinal hernia although she is tender significantly higher than I would expect from this alone. N obstructive symptoms. Hernia reduces. Will CT to r/o other pathology. Treat symptoms. Unfortunately if her employer is not accommodating of restriction recommendation then there isn't much I can do. Needs to follow-up with general surgery. Will address any potential additional findings.   Patient eloped prior to work-up.  Final Clinical Impression(s) / ED Diagnoses Final diagnoses:  Abdominal pain, unspecified abdominal location    Rx / DC Orders ED Discharge Orders    None         Raeford Razor, MD 06/12/20 [phone]

## 2020-06-09 NOTE — ED Triage Notes (Signed)
Pt reports here a few days ago and diagnosed with hernia in her abdomen, states she was placed on light duty by EDP but has not been able to do light duty at her job due to staffing, having worsening pain now that radiates to her back.

## 2020-06-09 NOTE — ED Notes (Signed)
Pt not in room at RN assessment. Checked Bathroom and CT. Pt left AMA via ambulation.

## 2020-10-01 ENCOUNTER — Emergency Department (HOSPITAL_COMMUNITY)
Admission: EM | Admit: 2020-10-01 | Discharge: 2020-10-01 | Disposition: A | Discharge status: Home / Self Care | Payer: BLUE CROSS/BLUE SHIELD | Attending: Emergency Medicine | Admitting: Emergency Medicine

## 2020-10-01 ENCOUNTER — Other Ambulatory Visit: Payer: Self-pay

## 2020-10-01 ENCOUNTER — Encounter (HOSPITAL_COMMUNITY): Payer: Self-pay

## 2020-10-01 DIAGNOSIS — R1030 Lower abdominal pain, unspecified: Secondary | ICD-10-CM | POA: Insufficient documentation

## 2020-10-01 DIAGNOSIS — Z5321 Procedure and treatment not carried out due to patient leaving prior to being seen by health care provider: Secondary | ICD-10-CM | POA: Insufficient documentation

## 2020-10-01 DIAGNOSIS — M79651 Pain in right thigh: Secondary | ICD-10-CM | POA: Insufficient documentation

## 2020-10-01 LAB — CBC
HCT: 38.5 % (ref 36.0–46.0)
Hemoglobin: 12.9 g/dL (ref 12.0–15.0)
MCH: 32.3 pg (ref 26.0–34.0)
MCHC: 33.5 g/dL (ref 30.0–36.0)
MCV: 96.3 fL (ref 80.0–100.0)
Platelets: 197 10*3/uL (ref 150–400)
RBC: 4 MIL/uL (ref 3.87–5.11)
RDW: 11.9 % (ref 11.5–15.5)
WBC: 5.9 10*3/uL (ref 4.0–10.5)
nRBC: 0 % (ref 0.0–0.2)

## 2020-10-01 LAB — COMPREHENSIVE METABOLIC PANEL
ALT: 13 U/L (ref 0–44)
AST: 16 U/L (ref 15–41)
Albumin: 4.2 g/dL (ref 3.5–5.0)
Alkaline Phosphatase: 77 U/L (ref 38–126)
Anion gap: 8 (ref 5–15)
BUN: 18 mg/dL (ref 6–20)
CO2: 25 mmol/L (ref 22–32)
Calcium: 9.1 mg/dL (ref 8.9–10.3)
Chloride: 104 mmol/L (ref 98–111)
Creatinine, Ser: 0.97 mg/dL (ref 0.44–1.00)
GFR, Estimated: >60 mL/min (ref >60)
Glucose, Bld: 106 mg/dL — ABNORMAL HIGH (ref 70–99)
Potassium: 3.6 mmol/L (ref 3.5–5.1)
Sodium: 137 mmol/L (ref 135–145)
Total Bilirubin: 0.2 mg/dL — ABNORMAL LOW (ref 0.3–1.2)
Total Protein: 6.6 g/dL (ref 6.5–8.1)

## 2020-10-01 LAB — URINALYSIS, ROUTINE W REFLEX MICROSCOPIC
Bilirubin Urine: NEGATIVE
Glucose, UA: NEGATIVE mg/dL
Hgb urine dipstick: NEGATIVE
Ketones, ur: 5 mg/dL — AB
Leukocytes,Ua: NEGATIVE
Nitrite: NEGATIVE
Protein, ur: NEGATIVE mg/dL
Specific Gravity, Urine: 1.028 (ref 1.005–1.030)
pH: 6 (ref 5.0–8.0)

## 2020-10-01 LAB — I-STAT BETA HCG BLOOD, ED (MC, WL, AP ONLY): I-stat hCG, quantitative: 8.5 m[IU]/mL — ABNORMAL HIGH (ref <5)

## 2020-10-01 LAB — LIPASE, BLOOD: Lipase: 35 U/L (ref 11–51)

## 2020-10-01 NOTE — ED Triage Notes (Signed)
Pt presents with c/o abdominal pain. Pt reports she has been seen for same, has had a colonoscopy, and was initially diagnosed with an inguinal hernia which she reports turned out to be fatty tissue. Pt reports the pain is in her lower abdomen and goes into her right groin and right thigh area.

## 2020-11-27 ENCOUNTER — Ambulatory Visit: Payer: BLUE CROSS/BLUE SHIELD | Admitting: Nurse Practitioner

## 2020-12-26 ENCOUNTER — Ambulatory Visit: Payer: Self-pay | Admitting: Nurse Practitioner

## 2021-01-22 ENCOUNTER — Ambulatory Visit: Payer: Self-pay | Admitting: Gastroenterology

## 2021-03-30 ENCOUNTER — Ambulatory Visit
Admission: EM | Admit: 2021-03-30 | Discharge: 2021-03-30 | Disposition: A | Discharge status: Home / Self Care | Payer: Self-pay | Attending: Emergency Medicine | Admitting: Emergency Medicine

## 2021-03-30 ENCOUNTER — Encounter: Payer: Self-pay | Admitting: Emergency Medicine

## 2021-03-30 ENCOUNTER — Ambulatory Visit (INDEPENDENT_AMBULATORY_CARE_PROVIDER_SITE_OTHER): Payer: Self-pay

## 2021-03-30 ENCOUNTER — Other Ambulatory Visit: Payer: Self-pay

## 2021-03-30 DIAGNOSIS — S20212A Contusion of left front wall of thorax, initial encounter: Secondary | ICD-10-CM

## 2021-03-30 DIAGNOSIS — S62515A Nondisplaced fracture of proximal phalanx of left thumb, initial encounter for closed fracture: Secondary | ICD-10-CM

## 2021-03-30 MED ORDER — TIZANIDINE HCL 2 MG PO TABS: 2.0000 mg | ORAL_TABLET | Freq: Four times a day (QID) | ORAL | 0 refills | Status: DC | PRN

## 2021-03-30 MED ORDER — NAPROXEN 500 MG PO TABS: 500.0000 mg | ORAL_TABLET | Freq: Two times a day (BID) | ORAL | 0 refills | Status: DC
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
## 2021-03-30 NOTE — Discharge Instructions (Addendum)
Wear thumb spica brace 24/7 and follow up with hand specialist for fracture-contact below Naprosyn twice daily for pain and swelling May supplement with tizanidine as needed for back and neck pain Ice If you are developing worsening headache, vision changes, increased dizziness lightheadedness, please go to emergency room

## 2021-04-06 ENCOUNTER — Telehealth: Payer: Self-pay | Admitting: Physician Assistant

## 2021-04-06 NOTE — Telephone Encounter (Signed)
She has an appt next week.

## 2021-04-06 NOTE — Telephone Encounter (Signed)
Patient called advised the left top of hand near the break of her thumb is ice cold and want to know if that is normal? Patient said her thumb has been broken for a week now.  The number to contact patient is [phone]

## 2021-04-06 NOTE — Telephone Encounter (Signed)
That can be normal given the trauma to her thumb.  Give reassurance.

## 2021-04-06 NOTE — Telephone Encounter (Signed)
Aunt aware of below message

## 2021-04-09 ENCOUNTER — Other Ambulatory Visit: Payer: Self-pay

## 2021-04-09 ENCOUNTER — Ambulatory Visit (INDEPENDENT_AMBULATORY_CARE_PROVIDER_SITE_OTHER): Payer: Self-pay | Admitting: Physician Assistant

## 2021-04-09 ENCOUNTER — Ambulatory Visit (INDEPENDENT_AMBULATORY_CARE_PROVIDER_SITE_OTHER): Payer: Self-pay

## 2021-04-09 ENCOUNTER — Encounter: Payer: Self-pay | Admitting: Physician Assistant

## 2021-04-09 DIAGNOSIS — M79645 Pain in left finger(s): Secondary | ICD-10-CM

## 2021-04-09 DIAGNOSIS — S62515D Nondisplaced fracture of proximal phalanx of left thumb, subsequent encounter for fracture with routine healing: Secondary | ICD-10-CM

## 2021-04-09 MED ORDER — TRAMADOL HCL 50 MG PO TABS: 50.0000 mg | ORAL_TABLET | Freq: Four times a day (QID) | ORAL | 0 refills | Status: DC | PRN
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
## 2021-04-23 ENCOUNTER — Other Ambulatory Visit: Payer: Self-pay

## 2021-04-23 ENCOUNTER — Ambulatory Visit (INDEPENDENT_AMBULATORY_CARE_PROVIDER_SITE_OTHER): Payer: 59

## 2021-04-23 ENCOUNTER — Encounter: Payer: Self-pay | Admitting: Physician Assistant

## 2021-04-23 ENCOUNTER — Ambulatory Visit (INDEPENDENT_AMBULATORY_CARE_PROVIDER_SITE_OTHER): Payer: 59 | Admitting: Physician Assistant

## 2021-04-23 DIAGNOSIS — M79645 Pain in left finger(s): Secondary | ICD-10-CM

## 2021-04-23 DIAGNOSIS — S62515D Nondisplaced fracture of proximal phalanx of left thumb, subsequent encounter for fracture with routine healing: Secondary | ICD-10-CM

## 2021-04-23 NOTE — Progress Notes (Signed)
HPI: Regina Daniel returns now 3-1/2 weeks status post nondisplaced fracture involving the proximal left thumb phalanx.  She has been in a thumb spica splint.  She had no new injury.  She statessoreness and swelling but overall improving.  Physical exam: Left she has tenderness and proximal phalanx.  No gross deformity.  No skin breakdown, rashes or impending ulcers involving the left hand.  Radiographs left thumb 3 views: Proximal phalanx fracture remains unchanged in overall position alignment.  There is no significant interval healing.  No other fractures identified.  Impression: 3-1/2 weeks status post left thumb proximal phalanx fracture  Plan: Continue left wrist splint with thumb spica.  No heavy lifting or gripping with the left hand.  Follow-up with [place] in 4 weeks for repeat films.

## 2021-05-23 ENCOUNTER — Ambulatory Visit: Payer: Self-pay

## 2021-05-23 ENCOUNTER — Encounter: Payer: Self-pay | Admitting: Physician Assistant

## 2021-05-23 ENCOUNTER — Ambulatory Visit (INDEPENDENT_AMBULATORY_CARE_PROVIDER_SITE_OTHER): Payer: 59 | Admitting: Physician Assistant

## 2021-05-23 ENCOUNTER — Other Ambulatory Visit: Payer: Self-pay

## 2021-05-23 DIAGNOSIS — S62515D Nondisplaced fracture of proximal phalanx of left thumb, subsequent encounter for fracture with routine healing: Secondary | ICD-10-CM

## 2021-05-23 NOTE — Progress Notes (Signed)
HPI: Krystale returns today now 7 weeks 5 days status post left thumb proximal phalanx fracture.  She states she has had increased pain over the past 5 days no new injury.  She feels as if thumbs crooked.  Is waking up at night.  She has been coming out of the thumb spica splint.  Physical exam: Left thumb she has tenderness slightly over the proximal phalanx.  No gross deformity.  She is able to flex through the IP joint of the left thumb.  There is no rashes skin lesions or ulcerations about the thumb or the hand.  Radiographs left thumb 3 views: Interval healing of the proximal phalanx fracture.  No change in overall position alignment.  No other fractures identified.  Impression: Left thumb nondisplaced fracture proximal phalanx  Plan: She can begin using the thumb working on range of motion.  No tight grasping or lifting with the left hand.  We will see her back in 4 weeks for repeat x-rays.  Questions were encouraged and answered at length.

## 2021-06-20 ENCOUNTER — Ambulatory Visit: Payer: 59 | Admitting: Physician Assistant

## 2021-07-16 ENCOUNTER — Ambulatory Visit: Payer: 59 | Admitting: Physician Assistant

## 2021-08-09 ENCOUNTER — Encounter (HOSPITAL_COMMUNITY): Payer: Self-pay | Admitting: Family

## 2021-08-09 ENCOUNTER — Other Ambulatory Visit (HOSPITAL_COMMUNITY)
Admission: EM | Admit: 2021-08-09 | Discharge: 2021-08-10 | Disposition: A | Discharge status: Home / Self Care | Payer: 59 | Attending: Psychiatry | Admitting: Psychiatry

## 2021-08-09 ENCOUNTER — Other Ambulatory Visit: Payer: Self-pay

## 2021-08-09 DIAGNOSIS — F333 Major depressive disorder, recurrent, severe with psychotic symptoms: Secondary | ICD-10-CM | POA: Diagnosis not present

## 2021-08-09 DIAGNOSIS — R45851 Suicidal ideations: Secondary | ICD-10-CM | POA: Insufficient documentation

## 2021-08-09 DIAGNOSIS — Z20822 Contact with and (suspected) exposure to covid-19: Secondary | ICD-10-CM | POA: Diagnosis not present

## 2021-08-09 DIAGNOSIS — F332 Major depressive disorder, recurrent severe without psychotic features: Secondary | ICD-10-CM | POA: Diagnosis not present

## 2021-08-09 DIAGNOSIS — R45850 Homicidal ideations: Secondary | ICD-10-CM | POA: Insufficient documentation

## 2021-08-09 DIAGNOSIS — F17210 Nicotine dependence, cigarettes, uncomplicated: Secondary | ICD-10-CM | POA: Diagnosis not present

## 2021-08-09 LAB — POCT URINE DRUG SCREEN - MANUAL ENTRY (I-SCREEN)
POC Amphetamine UR: NOT DETECTED
POC Buprenorphine (BUP): NOT DETECTED
POC Cocaine UR: POSITIVE — AB
POC Marijuana UR: NOT DETECTED
POC Methadone UR: NOT DETECTED
POC Methamphetamine UR: NOT DETECTED
POC Morphine: NOT DETECTED
POC Oxazepam (BZO): NOT DETECTED
POC Oxycodone UR: NOT DETECTED
POC Secobarbital (BAR): NOT DETECTED

## 2021-08-09 LAB — HEMOGLOBIN A1C
Hgb A1c MFr Bld: 5.4 % (ref 4.8–5.6)
Mean Plasma Glucose: 108.28 mg/dL

## 2021-08-09 LAB — CBC WITH DIFFERENTIAL/PLATELET
Abs Immature Granulocytes: 0.03 10*3/uL (ref 0.00–0.07)
Basophils Absolute: 0 10*3/uL (ref 0.0–0.1)
Basophils Relative: 1 %
Eosinophils Absolute: 0.2 10*3/uL (ref 0.0–0.5)
Eosinophils Relative: 3 %
HCT: 39.2 % (ref 36.0–46.0)
Hemoglobin: 13.5 g/dL (ref 12.0–15.0)
Immature Granulocytes: 0 %
Lymphocytes Relative: 28 %
Lymphs Abs: 2 10*3/uL (ref 0.7–4.0)
MCH: 31.5 pg (ref 26.0–34.0)
MCHC: 34.4 g/dL (ref 30.0–36.0)
MCV: 91.4 fL (ref 80.0–100.0)
Monocytes Absolute: 0.6 10*3/uL (ref 0.1–1.0)
Monocytes Relative: 9 %
Neutro Abs: 4.2 10*3/uL (ref 1.7–7.7)
Neutrophils Relative %: 59 %
Platelets: 224 10*3/uL (ref 150–400)
RBC: 4.29 MIL/uL (ref 3.87–5.11)
RDW: 12.4 % (ref 11.5–15.5)
WBC: 7.1 10*3/uL (ref 4.0–10.5)
nRBC: 0 % (ref 0.0–0.2)

## 2021-08-09 LAB — RESP PANEL BY RT-PCR (FLU A&B, COVID) ARPGX2
Influenza A by PCR: NEGATIVE
Influenza B by PCR: NEGATIVE
SARS Coronavirus 2 by RT PCR: NEGATIVE

## 2021-08-09 LAB — TSH: TSH: 0.929 u[IU]/mL (ref 0.350–4.500)

## 2021-08-09 MED ORDER — MAGNESIUM HYDROXIDE 400 MG/5ML PO SUSP: 30.0000 mL | Freq: Every day | ORAL | Status: DC | PRN

## 2021-08-09 MED ORDER — NICOTINE 14 MG/24HR TD PT24
14.0000 mg | MEDICATED_PATCH | Freq: Every day | TRANSDERMAL | Status: DC
  Administered 2021-08-09 – 2021-08-10 (×2): 14 mg via TRANSDERMAL
  Filled 2021-08-09 (×2): qty 1

## 2021-08-09 MED ORDER — HYDROXYZINE HCL 25 MG PO TABS: 25.0000 mg | ORAL_TABLET | Freq: Three times a day (TID) | ORAL | Status: DC | PRN

## 2021-08-09 MED ORDER — ALUM & MAG HYDROXIDE-SIMETH 200-200-20 MG/5ML PO SUSP: 30.0000 mL | ORAL | Status: DC | PRN

## 2021-08-09 MED ORDER — BUDESON-GLYCOPYRROL-FORMOTEROL 160-9-4.8 MCG/ACT IN AERO: 2.0000 | INHALATION_SPRAY | Freq: Two times a day (BID) | RESPIRATORY_TRACT | Status: DC | PRN

## 2021-08-09 MED ORDER — TRAZODONE HCL 50 MG PO TABS: 50.0000 mg | ORAL_TABLET | Freq: Every evening | ORAL | Status: DC | PRN

## 2021-08-09 MED ORDER — ACETAMINOPHEN 325 MG PO TABS: 650.0000 mg | ORAL_TABLET | Freq: Four times a day (QID) | ORAL | Status: DC | PRN

## 2021-08-09 MED ORDER — ESCITALOPRAM OXALATE 10 MG PO TABS
10.0000 mg | ORAL_TABLET | Freq: Every day | ORAL | Status: DC
  Administered 2021-08-09 – 2021-08-10 (×2): 10 mg via ORAL
  Filled 2021-08-09 (×2): qty 1

## 2021-08-09 NOTE — ED Notes (Signed)
Patient calm and in behavioral control.  Patient dysphoric and admits to greiving over the death of her son 2 years ago today.  She also reports recent and frequent use of cocaine as a way to cope with her grief.  She denies current suicidal ideation or plan and states she "just wants help."  Patient EKG, bloodwork and covid tests complete.  Will monitor and provide continued support.

## 2021-08-09 NOTE — ED Notes (Signed)
Patient has a locket that is sealed. Patient reports it has has son's ashes in the locket. Patient is sensitive about keeping it on her person. NP has been notified that patient wants to keep the locket on her person.

## 2021-08-09 NOTE — BH Assessment (Signed)
Comprehensive Clinical Assessment (CCA) Note  08/09/2021 Regina Daniel [id number]  Disposition: Per Doran Heater, NP, patient is recommended for admission to [facility].   Flowsheet Row ED from 08/09/2021 in [facility] ED from 03/30/2021 in [facility] Urgent Care at [facility]   C-SSRS RISK CATEGORY Low Risk No Risk      The patient demonstrates the following risk factors for suicide: Chronic risk factors for suicide include: substance use disorder. Acute risk factors for suicide include: unemployment and loss (financial, interpersonal, professional). Protective factors for this patient include: positive social support. Considering these factors, the overall suicide risk at this point appears to be moderate. Patient is not appropriate for outpatient follow up.   Regina Daniel is a 50 year old female presenting to [facility] under IVC due to telling her daughter "I want to be with my son" who died two years ago this date. Per IVC "Respondent has no known/formal diagnosis of mental health issues, however family states that respondent abuses narcotics.  Respondent has been acting erratically, especially as today is the anniversary of respondent's son's death by overdose.  Family states that respondent has been abusing crack cocaine to excess this week and she stated that she intended to "go out the same way that [name] did" presumably died by overdose.  She also posted a picture of herself on social media high on crack and wrote that she does not know how to cope or survive any longer.  Family is greatly concerned for her wellbeing."     Pt reports she has been on a 3-day crack binge and has a difficult time dealing with the death of her son. Patient reports her son died of a heroin/ fentanyl overdose. Pt reports she was on the phone calling for help when sheriff arrived to being her here. Patient reports she made the statement to her daughter because she knew her daughter would get her  some help, but she did not think her daughter would call the sheriff.   Patient is oriented to person, place and situation. Patient eye contact is normal, patient is tearful and depressed. Patient denies SI, HI, AVH at this time. Patient reports crack binge for the past three days and states her last use was last night. Patient reports she has never attempted suicide and contracts for safety. Patient does not have outpatient services and denies history of inpatient treatment. Patient reports she lost her job three weeks ago and she lives with elderly relatives.     Chief Complaint:  Chief Complaint  Patient presents with   Suicidal   Addiction Problem   Visit Diagnosis: MDD (major depressive disorder), recurrent severe, without psychosis (HCC)    CCA Screening, Triage and Referral (STR)  Patient Reported Information How did you hear about [place]? No data recorded What Is the Reason for Your Visit/Call Today? Pt made suicdal statements to her daughter. Pt under IVC  How Long Has This Been Causing You Problems? 1 wk - 1 month  What Do You Feel Would Help You the Most Today? Treatment for Depression or other mood problem   Have You Recently Had Any Thoughts About Hurting Yourself? Yes  Are You Planning to Commit Suicide/Harm Yourself At This time? No   Have you Recently Had Thoughts About Hurting Someone Regina Daniel? No  Are You Planning to Harm Someone at This Time? No  Explanation: No data recorded  Have You Used Any Alcohol or Drugs in the Past 24 Hours? Yes  How  Long Ago Did You Use Drugs or Alcohol? No data recorded What Did You Use and How Much? used crack   Do You Currently Have a Therapist/Psychiatrist? No data recorded Name of Therapist/Psychiatrist: No data recorded  Have You Been Recently Discharged From Any Office Practice or Programs? No data recorded Explanation of Discharge From Practice/Program: No data recorded    CCA Screening Triage Referral Assessment Type of  Contact: No data recorded Telemedicine Service Delivery:   Is this Initial or Reassessment? No data recorded Date Telepsych consult ordered in [facility]:  No data recorded Time Telepsych consult ordered in [facility]:  No data recorded Location of Assessment: No data recorded Provider Location: No data recorded  Collateral Involvement: No data recorded  Does Patient Have a Court Appointed Legal Guardian? No data recorded Name and Contact of Legal Guardian: No data recorded If Minor and Not Living with Parent(s), Who has Custody? No data recorded Is CPS involved or ever been involved? No data recorded Is APS involved or ever been involved? No data recorded  Patient Determined To Be At Risk for Harm To Self or Others Based on Review of Patient Reported Information or Presenting Complaint? No data recorded Method: No data recorded Availability of Means: No data recorded Intent: No data recorded Notification Required: No data recorded Additional Information for Danger to Others Potential: No data recorded Additional Comments for Danger to Others Potential: No data recorded Are There Guns or Other Weapons in Your Home? No data recorded Types of Guns/Weapons: No data recorded Are These Weapons Safely Secured?                            No data recorded Who Could Verify You Are Able To Have These Secured: No data recorded Do You Have any Outstanding Charges, Pending Court Dates, Parole/Probation? No data recorded Contacted To Inform of Risk of Harm To Self or Others: No data recorded   Does Patient Present under Involuntary Commitment? No data recorded IVC Papers Initial File Date: No data recorded  [place] of Residence: No data recorded  Patient Currently Receiving the Following Services: No data recorded  Determination of Need: Urgent (48 hours)   Options For Referral: Outpatient Therapy; Inpatient Hospitalization; Medication Management; Facility-Based Crisis     CCA  Biopsychosocial Patient Reported Schizophrenia/Schizoaffective Diagnosis in Past: No   Strengths: No data recorded  Mental Health Symptoms Depression:   Change in energy/activity; Difficulty Concentrating; Tearfulness; Irritability; Sleep (too much or little)   Duration of Depressive symptoms:  Duration of Depressive Symptoms: Less than two weeks   Mania:   None   Anxiety:    Worrying; Tension; Sleep; Difficulty concentrating   Psychosis:   None   Duration of Psychotic symptoms:    Trauma:   None   Obsessions:   None   Compulsions:   None   Inattention:   None   Hyperactivity/Impulsivity:   None   Oppositional/Defiant Behaviors:   None   Emotional Irregularity:   None   Other Mood/Personality Symptoms:  No data recorded   Mental Status Exam Appearance and self-care  Stature:   Average   Weight:   Thin   Clothing:  No data recorded  Grooming:   Normal   Cosmetic use:   Age appropriate   Posture/gait:   Normal   Motor activity:   Not Remarkable   Sensorium  Attention:   Normal   Concentration:   Normal   Orientation:  X5   Recall/memory:   Normal   Affect and Mood  Affect:   Depressed; Tearful   Mood:   Depressed   Relating  Eye contact:   Normal   Facial expression:   Depressed; Sad   Attitude toward examiner:   Cooperative   Thought and Language  Speech flow:  Clear and Coherent   Thought content:   Appropriate to Mood and Circumstances   Preoccupation:   None   Hallucinations:   None   Organization:  No data recorded  [employer] of Knowledge:   Fair   Intelligence:   Average   Abstraction:   Normal   Judgement:   Poor   Reality Testing:   Adequate   Insight:   Fair   Decision Making:   Normal   Social Functioning  Social Maturity:   Impulsive   Social Judgement:   Normal   Stress  Stressors:   Grief/losses   Coping Ability:   Overwhelmed; Exhausted    Skill Deficits:   None   Supports:   Family     Religion:    Leisure/Recreation:    Exercise/Diet:     CCA Employment/Education Employment/Work Situation: Employment / Work Situation Employment Situation: Unemployed Patient's Job has Been Impacted by Current Illness: Yes Describe how Patient's Job has Been Impacted: lost job three weeks ago  Education: Education Is Patient Currently Attending School?: No   CCA Family/Childhood History Family and Relationship History: Family history Marital status: Divorced Does patient have children?: Yes How is patient's relationship with their children?: Son died by overdose  Childhood History:     Child/Adolescent Assessment:     CCA Substance Use Alcohol/Drug Use: Alcohol / Drug Use Pain Medications: See MAR Prescriptions: See MAR Over the Counter: See MAR History of alcohol / drug use?: Yes Substance #1 Name of Substance 1: Crack 1 - Frequency: daily 1 - Duration: ongoing 1 - Last Use / Amount: last night 1- Route of Use: smoking                       ASAM's:  Six Dimensions of Multidimensional Assessment  Dimension 1:  Acute Intoxication and/or Withdrawal Potential:      Dimension 2:  Biomedical Conditions and Complications:      Dimension 3:  Emotional, Behavioral, or Cognitive Conditions and Complications:     Dimension 4:  Readiness to Change:     Dimension 5:  Relapse, Continued use, or Continued Problem Potential:     Dimension 6:  Recovery/Living Environment:     ASAM Severity Score:    ASAM Recommended Level of Treatment: ASAM Recommended Level of Treatment: Level II Intensive Outpatient Treatment   Substance use Disorder (SUD)    Recommendations for Services/Supports/Treatments: Recommendations for Services/Supports/Treatments Recommendations For Services/Supports/Treatments: Facility Based Crisis, SAIOP (Substance Abuse Intensive Outpatient Program)  Discharge Disposition:     DSM5 Diagnoses: Patient Active Problem List   Diagnosis Date Noted   MDD (major depressive disorder), recurrent severe, without psychosis (HCC) 08/09/2021   Rupture of extensor tendons of left hand and wrist 02/25/2019   Pyelonephritis 12/20/2017   Acute pain of left shoulder 01/23/2017   Traumatic closed displaced fracture of distal end of left radius with malunion 03/29/2016   Distal radius fracture 03/29/2016     Referrals to Alternative Service(s): Referred to Alternative Service(s):   Place:   Date:   Time:    Referred to Alternative Service(s):   Place:  Date:   Time:    Referred to Alternative Service(s):   Place:   Date:   Time:    Referred to Alternative Service(s):   Place:   Date:   Time:     Luther Redo, [facility]

## 2021-08-09 NOTE — Progress Notes (Signed)
Pt is a transfer from [facility] and admitted to [facility] due SI with plan to overdose. Pt is alert and oriented with flat affect. Pt is ambulatory and is oriented to staff/unit. Pt was cooperative with admission and skin process. Pt denies pain, current SI/HI/AVH.  Staff will monitor for pt's safety.

## 2021-08-09 NOTE — ED Provider Notes (Signed)
Behavioral Health Admission [facility] & OBS)  Date: 08/09/21 Patient Name: Regina Daniel MRN: [id number] Chief Complaint:  Chief Complaint  Patient presents with   Suicidal   Addiction Problem      Diagnoses:  Final diagnoses:  MDD (major depressive disorder), recurrent severe, without psychosis (HCC)    HPI: Regina Daniel is a 50 year old female.  Patient presents to [facility] behavioral health transported by police.  She is currently under involuntary commitment petition completed by her sister, Regina Daniel.  Petition reads: "Respondent has no known/formal diagnosis of mental health issues, however family states that respondent abuses narcotics.  Respondent has been acting erratically, especially as today is the anniversary of respondent's son's death by overdose.  Family states that respondent has been abusing crack cocaine to excess this week and she stated that she intended to "go out the same way that [name] did" presumably died by overdose.  She also posted a picture of herself on social media high on crack and wrote that she does not know how to cope or survive any longer.  Family is greatly concerned for her wellbeing."  Patient states "I was on the phone with rehab, I was trying to get some help when the sheriff's pulled up (to serve involuntary commitment petition.)" Alisha reports "every anniversary of his death and every birthday I relapsed on crack cocaine because I do not know how to handle it."  She states "now I have nothing but ashes."  She reports she did not intend to complete suicide today, states "I said that because I know what my daughter will do, I wanted help."  She is not currently linked with outpatient psychiatry but is willing to begin counseling and medication management.  She denies any current medication.  Patient is assessed face-to-face by nurse practitioner.  She is seated in assessment area, no acute distress.  She is alert and oriented, cooperative  during assessment.  She reports depressed mood with congruent affect, tearful at times.  She denies suicidal and homicidal ideations.  She denies any history of suicide attempts, denies history of self-harm.  She contracts verbally for safety with this Clinical research associate.   She has normal speech and behavior.  She denies both auditory and visual hallucinations.  Patient is able to converse coherently with goal-directed thoughts and no distractibility or preoccupation.  She denies paranoia.  Objectively there is no evidence of psychosis/mania or delusional thinking.  She resides in [place] with her mother, aunt and uncle.  She denies access to weapons.  She is currently not employed, typically works in [employer].  She endorses crack cocaine use, chronically.  Last use of crack cocaine on yesterday.  She reports readiness to stop using cocaine.  She endorses decreased sleep and average appetite.  Patient offered support and encouragement.   PHQ 2-9:   Flowsheet Row ED from 08/09/2021 in [facility] ED from 03/30/2021 in [facility] Urgent Care at [facility]   C-SSRS RISK CATEGORY Low Risk No Risk        Total Time spent with patient: 30 minutes  Musculoskeletal  Strength & Muscle Tone: within normal limits Gait & Station: normal Patient leans: N/A  Psychiatric Specialty Exam  Presentation General Appearance: Appropriate for Environment; Casual  Eye Contact:Good  Speech:Clear and Coherent; Normal Rate  Speech Volume:Normal  Handedness:Right   Mood and Affect  Mood:Depressed  Affect:Depressed; Tearful   Thought Process  Thought Processes:Coherent; Goal Directed; Linear  Descriptions of Associations:Intact  Orientation:Full (Time, Place and Person)  Thought Content:Logical; WDL    Hallucinations:Hallucinations: None  Ideas of Reference:None  Suicidal Thoughts:Suicidal Thoughts: No  Homicidal Thoughts:Homicidal Thoughts:  No   Sensorium  Memory:Immediate Good; Recent Good; Remote Good  Judgment:Good  Insight:Fair   Executive Functions  Concentration:Good  Attention Span:Good  Recall:Good  Fund of Knowledge:Good  Language:Good   Psychomotor Activity  Psychomotor Activity:Psychomotor Activity: Normal   Assets  Assets:Communication Skills; Desire for Improvement; Financial Resources/Insurance; Housing; Intimacy; Leisure Time; Physical Health; Resilience; Social Support; Talents/Skills; Transportation   Sleep  Sleep:Sleep: Poor   Nutritional Assessment (For OBS and FBC admissions only) Has the patient had a weight loss or gain of 10 pounds or more in the last 3 months?: No Has the patient had a decrease in food intake/or appetite?: No Does the patient have dental problems?: No Does the patient have eating habits or behaviors that may be indicators of an eating disorder including binging or inducing vomiting?: No Has the patient recently lost weight without trying?: 0 Has the patient been eating poorly because of a decreased appetite?: 0 Malnutrition Screening Tool Score: 0   Physical Exam Vitals and nursing note reviewed.  Constitutional:      Appearance: Normal appearance. She is well-developed and normal weight.  HENT:     Head: Normocephalic and atraumatic.     Nose: Nose normal.  Cardiovascular:     Rate and Rhythm: Normal rate.  Pulmonary:     Effort: Pulmonary effort is normal.  Musculoskeletal:        General: Normal range of motion.     Cervical back: Normal range of motion.  Neurological:     Mental Status: She is alert and oriented to person, place, and time.  Psychiatric:        Attention and Perception: Attention and perception normal.        Mood and Affect: Mood is depressed. Affect is tearful.        Speech: Speech normal.        Behavior: Behavior normal. Behavior is cooperative.        Thought Content: Thought content normal.        Cognition and Memory:  Cognition and memory normal.        Judgment: Judgment normal.   Review of Systems  Constitutional: Negative.   HENT: Negative.    Eyes: Negative.   Respiratory: Negative.    Cardiovascular: Negative.   Gastrointestinal: Negative.   Genitourinary: Negative.   Musculoskeletal: Negative.   Skin: Negative.   Neurological: Negative.   Endo/Heme/Allergies: Negative.   Psychiatric/Behavioral:  Positive for depression and substance abuse. The patient has insomnia.    Blood pressure 98/76, pulse 92, temperature 99 F (37.2 C), temperature source Oral, resp. rate 20, last menstrual period 11/04/2010, SpO2 100 %. There is no height or weight on file to calculate BMI.  Past Psychiatric History: none reported  Is the patient at risk to self? No  Has the patient been a risk to self in the past 6 months? No .    Has the patient been a risk to self within the distant past? No   Is the patient a risk to others? No   Has the patient been a risk to others in the past 6 months? No   Has the patient been a risk to others within the distant past? No   Past Medical History:  Past Medical History:  Diagnosis Date   Drug abuse in remission (HCC)  Left wrist fracture JAN 6.2017   Ruptured extensor tendon of hand or wrist    Tobacco dependence     Past Surgical History:  Procedure Laterality Date   CHOLECYSTECTOMY     NASAL SEPTUM SURGERY  MARCH 2017   ORIF WRIST FRACTURE Left 03/29/2016   Procedure: LEFT WRIST OSTEOTOMY WITH OPEN REDUCTION INTERNAL FIXATION (ORIF) ;  Surgeon: Kathryne Hitch, MD;  Location: [facility] ORS;  Service: Orthopedics;  Laterality: Left;  Left Intra-clavicular block   TUBAL LIGATION     WRIST SURGERY Right YEARS AGO   TENDON REPAIR    Family History: No family history on file.  Social History:  Social History   Socioeconomic History   Marital status: Divorced    Spouse name: Not on file   Number of children: Not on file   Years of education: Not on file    Highest education level: Not on file  Occupational History   Not on file  Tobacco Use   Smoking status: Every Day    Packs/day: 0.50    Years: 32.00    Pack years: 16.00    Types: Cigarettes   Smokeless tobacco: Never   Tobacco comments:    USING E CIGARETTE NOW  Vaping Use   Vaping Use: Former  Substance and Sexual Activity   Alcohol use: No   Drug use: No    Types: Cocaine    Comment: last used 04-15-15   Sexual activity: Not on file  Other Topics Concern   Not on file  Social History Narrative   Not on file   Social Determinants of Health   Financial Resource Strain: Not on file  Food Insecurity: Not on file  Transportation Needs: Not on file  Physical Activity: Not on file  Stress: Not on file  Social Connections: Not on file  Intimate Partner Violence: Not on file    SDOH:  SDOH Screenings   Alcohol Screen: Not on file  Depression (PHQ2-9): Not on file  Financial Resource Strain: Not on file  Food Insecurity: Not on file  Housing: Not on file  Physical Activity: Not on file  Social Connections: Not on file  Stress: Not on file  Tobacco Use: High Risk   Smoking Tobacco Use: Every Day   Smokeless Tobacco Use: Never  Transportation Needs: Not on file    Last Labs:  No visits with results within 6 Month(s) from this visit.  Latest known visit with results is:  Admission on 10/01/2020, Discharged on 10/01/2020  Component Date Value Ref Range Status   Lipase 10/01/2020 35  11 - 51 U/L Final   Performed at [facility], [address], [place], [place] [address]   Sodium 10/01/2020 137  135 - 145 mmol/L Final   Potassium 10/01/2020 3.6  3.5 - 5.1 mmol/L Final   Chloride 10/01/2020 104  98 - 111 mmol/L Final   CO2 10/01/2020 25  22 - 32 mmol/L Final   Glucose, Bld 10/01/2020 106 (A) 70 - 99 mg/dL Final   Glucose reference range applies only to samples taken after fasting for at least 8 hours.   BUN 10/01/2020 18  6 - 20 mg/dL Final    Creatinine, Ser 10/01/2020 0.97  0.44 - 1.00 mg/dL Final   Calcium 33/29/5188 9.1  8.9 - 10.3 mg/dL Final   Total Protein 41/66/0630 6.6  6.5 - 8.1 g/dL Final   Albumin 16/11/930 4.2  3.5 - 5.0 g/dL Final   AST 35/57/3220 16  15 - 41 U/L Final   ALT 10/01/2020 13  0 - 44 U/L Final   Alkaline Phosphatase 10/01/2020 77  38 - 126 U/L Final   Total Bilirubin 10/01/2020 0.2 (A) 0.3 - 1.2 mg/dL Final   GFR, Estimated 10/01/2020 >60  >60 mL/min Final   Comment: (NOTE) Calculated using the CKD-EPI Creatinine Equation (2021)    Anion gap 10/01/2020 8  5 - 15 Final   Performed at [facility], [address], [place], [place] [address]   WBC 10/01/2020 5.9  4.0 - 10.5 K/uL Final   RBC 10/01/2020 4.00  3.87 - 5.11 MIL/uL Final   Hemoglobin 10/01/2020 12.9  12.0 - 15.0 g/dL Final   HCT 60/45/4098 38.5  36.0 - 46.0 % Final   MCV 10/01/2020 96.3  80.0 - 100.0 fL Final   MCH 10/01/2020 32.3  26.0 - 34.0 pg Final   MCHC 10/01/2020 33.5  30.0 - 36.0 g/dL Final   RDW 11/91/4782 11.9  11.5 - 15.5 % Final   Platelets 10/01/2020 197  150 - 400 K/uL Final   nRBC 10/01/2020 0.0  0.0 - 0.2 % Final   Performed at [facility], [address] [place], [place] [address]   Color, Urine 10/01/2020 YELLOW  YELLOW Final   APPearance 10/01/2020 CLEAR  CLEAR Final   Specific Gravity, Urine 10/01/2020 1.028  1.005 - 1.030 Final   pH 10/01/2020 6.0  5.0 - 8.0 Final   Glucose, UA 10/01/2020 NEGATIVE  NEGATIVE mg/dL Final   Hgb urine dipstick 10/01/2020 NEGATIVE  NEGATIVE Final   Bilirubin Urine 10/01/2020 NEGATIVE  NEGATIVE Final   Ketones, ur 10/01/2020 5 (A) NEGATIVE mg/dL Final   Protein, ur 30/86/5784 NEGATIVE  NEGATIVE mg/dL Final   Nitrite 69/62/9528 NEGATIVE  NEGATIVE Final   Leukocytes,Ua 10/01/2020 NEGATIVE  NEGATIVE Final   Performed at [facility], [address], [place], [place] [address]   I-stat hCG, quantitative 10/01/2020 8.5 (A) <5 mIU/mL  Final   Comment 3 10/01/2020          Final   Comment:   GEST. AGE      CONC.  (mIU/mL)   <=1 WEEK        5 - 50     2 WEEKS       50 - 500     3 WEEKS       100 - 10,000     4 WEEKS     1,000 - 30,000        FEMALE AND NON-PREGNANT FEMALE:     LESS THAN 5 mIU/mL     Allergies: Patient has no known allergies.  PTA Medications: (Not in a hospital admission)   Medical Decision Making  Patient reviewed with Dr. Nelly Rout.  Involuntary commitment petition rescinded by this Clinical research associate.  Thatiana agrees to voluntary admission to facility based crisis unit for treatment and stabilization.  Discussed medication, escitalopram, discussed side effects, answered patient questions.  Alea agrees to initiate escitalopram 10 mg daily to address mood. Laboratory studies ordered.  Urine drug screen and urine pregnancy screen ordered.  EKG order initiated.  Current medications: -Acetaminophen 650 mg every 6 as needed/mouth pain -Escitalopram 10 mg daily/mood. -Maalox 30 mL oral every 4 as needed/digestion -Hydroxyzine 25 mg 3 times daily as needed/anxiety -Magnesium hydroxide 30 mL daily as needed/mild constipation -Trazodone 50 mg nightly as needed/sleep     Recommendations  Based on my evaluation the patient does not  appear to have an emergency medical condition.  Lenard Lance, FNP 08/09/21  2:51 PM

## 2021-08-09 NOTE — BH Assessment (Signed)
Pt to [facility] under IVC due to telling her daughter "I want to be with my son" who died two years ago this date. Pt reports she has been on a 3 day crack binge and has a difficult time dealing with the death of her son. Pt reports she was on the phone calling for help when sheriff arrived to being her here. Pt denies SI, HI, AVH currently.   Pt urgent

## 2021-08-10 DIAGNOSIS — F332 Major depressive disorder, recurrent severe without psychotic features: Secondary | ICD-10-CM | POA: Diagnosis not present

## 2021-08-10 LAB — PROLACTIN: Prolactin: 9.5 ng/mL (ref 4.8–23.3)

## 2021-08-10 LAB — POCT PREGNANCY, URINE: Preg Test, Ur: NEGATIVE

## 2021-08-10 MED ORDER — ESCITALOPRAM OXALATE 10 MG PO TABS: 10.0000 mg | ORAL_TABLET | Freq: Every day | ORAL | 0 refills | Status: DC

## 2021-08-10 NOTE — Progress Notes (Signed)
Patient is requesting discharge at this time social worker and provider are aware.

## 2021-08-10 NOTE — ED Provider Notes (Signed)
Regina Daniel is a 50 year old female.  Patient presents to [facility] behavioral [facility] transported by police.  She is currently under involuntary commitment petition completed by her sister, [name].   Petition reads: "Respondent has no known/formal diagnosis of mental health issues, however family states that respondent abuses narcotics.  Respondent has been acting erratically, especially as today is the anniversary of respondent's son's death by overdose.  Family states that respondent has been abusing crack cocaine to excess this week and she stated that she intended to "go out the same way that [name] did" presumably died by overdose.  She also posted a picture of herself on social media high on crack and wrote that she does not know how to cope or survive any longer.  Family is greatly concerned for her wellbeing."   Patient was seen and reassessed by this nurse practitioner.  She is observed to be lying in bed, in no apparent or acute distress.  She is alert and oriented x4, grudgingly cooperative during assessment but participates.  She does admit to making suicidal statements, with no intent to attempt suicide.  She reports history worsening depression as it relates to her sons accidental overdose 2 years ago.  She does appear to be open to seeking outpatient therapy and substance abuse resources.  She further admits that she has been abusing crack cocaine intermittently since his death, and is not experiencing any withdrawal symptoms at this time.  She denies any history of suicide attempts, denies any history of self-harm.  She further endorses any current suicidal ideations, homicidal ideations, and or auditory or visual hallucinations.  She is able to contract for safety while on the unit.  -Continue current medication recommendations at this time. -Continue to monitor and observe closely for cocaine withdrawal symptoms. -We will also continue working closely with LCSW for appropriate  and safe disposition of patient to include outpatient psychiatric resources, and substance abuse intensive outpatient programming.

## 2021-08-10 NOTE — Discharge Instructions (Addendum)
Please continue to take medications as directed. If your symptoms return, worsen, or persist please call your 911, report to local ER, or contact crisis hotline. Please do not drink alcohol or use any illegal substances while taking prescription medications.  °

## 2021-08-10 NOTE — ED Notes (Signed)
Pt attended group 

## 2021-08-10 NOTE — ED Notes (Signed)
Pt in bed sleeping, respirations are even/unlabored, environment check complete/secure, will continue to monitor patient for safety 

## 2021-08-10 NOTE — Progress Notes (Signed)
Patient discharged from facility.  Picked up by safe transport.  Given d/c information and verbalized understanding of same. She was ambulatory and denied avh shi or plan.

## 2021-08-10 NOTE — Progress Notes (Signed)
Patient calm and quiet on unit.  She has spent most of the morning asleep in bed.  She makes needs known appropriately and has been without complaint or WDL signs or symptoms.  Will monitor and provide safe supportive environment.

## 2021-08-10 NOTE — ED Notes (Signed)
Pt given breakfast.

## 2021-08-10 NOTE — ED Provider Notes (Signed)
Regina Daniel is a 50 year old female.  Patient presents to [facility] behavioral [facility] transported by police.  She is currently under involuntary commitment petition completed by her sister, [name].    Petition reads: "Respondent has no known/formal diagnosis of mental health issues, however family states that respondent abuses narcotics.  Respondent has been acting erratically, especially as today is the anniversary of respondent's son's death by overdose.  Family states that respondent has been abusing crack cocaine to excess this week and she stated that she intended to "go out the same way that [name] did" presumably died by overdose.  She also posted a picture of herself on social media high on crack and wrote that she does not know how to cope or survive any longer.  Family is greatly concerned for her wellbeing."   Patient was seen and reassessed by this nurse practitioner.  She is observed to be lying in bed, in no apparent or acute distress.  She is alert and oriented x4, grudgingly cooperative during assessment but participates.  She does admit to making suicidal statements, with no intent to attempt suicide.  She reports history worsening depression as it relates to her sons accidental overdose 2 years ago.  She does appear to be open to seeking outpatient therapy and substance abuse resources.  She further admits that she has been abusing crack cocaine intermittently since his death, and is not experiencing any withdrawal symptoms at this time.  She denies any history of suicide attempts, denies any history of self-harm.  She further endorses any current suicidal ideations, homicidal ideations, and or auditory or visual hallucinations.  She is able to contract for safety while on the unit.  Patient to be discharged at this time. Please see SRA.

## 2021-08-10 NOTE — Progress Notes (Signed)
Received patient asleep in bed without distress.  NARD.  Will continue to monitor and meet needs as warranted.
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
## 2021-08-10 NOTE — Clinical Social Work Psych Note (Signed)
CSW Note   9:30AM  CSW met with Regina Daniel for introduction and to begin discussions regarding discharge planning.   Regina Daniel reports that she continues to grieve the loss of her son, who passed away 2 years ago on 08-21-19. She reports she went on a binge using crack cocaine for 3 days. 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 Regina Daniel is currently unemployed.   Regina Daniel reports that she does not use drugs on a regular basis, however when she experiences an increase in depression and grief, "it is the only thing that makes me feel better". Regina Daniel declined any referrals for residential rehabilitation programming, however expressed interest in outpatient substance abuse services. She also was agreeable with resources for standard outpatient psychiatric services as well.    Regina Daniel reports she plans to return home with her mother.   2:00PM  Regina Daniel is now requesting to discharge as soon as possible. CSW and RN explained that she would have to meet with a provider and if it is determined safe for her to discharge, then her treatment team would move forward. Regina Daniel expressed understanding.   Regina Loveless, NP notified.    CSW will continue to follow  Regina Daniel, MSW, LCSW Clinical Social Worker (Page) [facility]

## 2021-08-10 NOTE — ED Notes (Signed)
Pt in bed sleeping, respirations even/unlabored, environment check complete/secure, will continue to monitor patient for safety. 

## 2021-08-10 NOTE — Clinical Social Work Psych Note (Signed)
Cognitive Distortions & Restructuring  Cognitive Distortions & Cognitive Restructuring (CBT & DBT Skills)  Date: 08/10/21  Type of Therapy/Therapeutic Modalities:  Participation Level: Active  Objective: The purpose of this group is to discuss and assist patients in identifying cognitive distortions (negative thinking patterns) that can influence their emotions and behaviors. Facilitators will guide conversations that discuss how these cognitive distortions contribute to common disorders such as anxiety and depression.   Therapeutic Goals:  Patient will identify negative thinking patterns they currently experience and how those thoughts influence their behaviors.  Patient will begin to explore the possible misinformation and/or traumatic experiences that influence their negative thought patterns.  Patient will explore the foundations and techniques of cognitive restructuring by reviewing CBT and DBT techniques.  Patient will discuss the   Summary of Patient's Progress: Pt identified different cognitive distortions that she could relate to such as; overgeneralization, emotional reasoning and disqualifying the positive. Pt stated that she has many stressors that she has a hard time coping with. Pt was able to identify two coping skills that she could implement into her daily routine (listening to music and playing with her dog).

## 2021-10-12 ENCOUNTER — Emergency Department (HOSPITAL_COMMUNITY)
Admission: EM | Admit: 2021-10-12 | Discharge: 2021-10-13 | Disposition: A | Discharge status: Home / Self Care | Payer: 59 | Attending: Emergency Medicine | Admitting: Emergency Medicine

## 2021-10-12 ENCOUNTER — Encounter (HOSPITAL_COMMUNITY): Payer: Self-pay

## 2021-10-12 DIAGNOSIS — F17210 Nicotine dependence, cigarettes, uncomplicated: Secondary | ICD-10-CM | POA: Insufficient documentation

## 2021-10-12 DIAGNOSIS — K4090 Unilateral inguinal hernia, without obstruction or gangrene, not specified as recurrent: Secondary | ICD-10-CM | POA: Diagnosis not present

## 2021-10-12 DIAGNOSIS — R1031 Right lower quadrant pain: Secondary | ICD-10-CM | POA: Diagnosis present

## 2021-10-12 LAB — CBC WITH DIFFERENTIAL/PLATELET
Abs Immature Granulocytes: 0.01 10*3/uL (ref 0.00–0.07)
Basophils Absolute: 0.1 10*3/uL (ref 0.0–0.1)
Basophils Relative: 1 %
Eosinophils Absolute: 0.4 10*3/uL (ref 0.0–0.5)
Eosinophils Relative: 6 %
HCT: 37.8 % (ref 36.0–46.0)
Hemoglobin: 12.2 g/dL (ref 12.0–15.0)
Immature Granulocytes: 0 %
Lymphocytes Relative: 28 %
Lymphs Abs: 1.5 10*3/uL (ref 0.7–4.0)
MCH: 32 pg (ref 26.0–34.0)
MCHC: 32.3 g/dL (ref 30.0–36.0)
MCV: 99.2 fL (ref 80.0–100.0)
Monocytes Absolute: 0.4 10*3/uL (ref 0.1–1.0)
Monocytes Relative: 7 %
Neutro Abs: 3.2 10*3/uL (ref 1.7–7.7)
Neutrophils Relative %: 58 %
Platelets: 187 10*3/uL (ref 150–400)
RBC: 3.81 MIL/uL — ABNORMAL LOW (ref 3.87–5.11)
RDW: 12.5 % (ref 11.5–15.5)
WBC: 5.5 10*3/uL (ref 4.0–10.5)
nRBC: 0 % (ref 0.0–0.2)

## 2021-10-12 LAB — COMPREHENSIVE METABOLIC PANEL
ALT: 13 U/L (ref 0–44)
AST: 18 U/L (ref 15–41)
Albumin: 3.4 g/dL — ABNORMAL LOW (ref 3.5–5.0)
Alkaline Phosphatase: 78 U/L (ref 38–126)
Anion gap: 6 (ref 5–15)
BUN: 6 mg/dL (ref 6–20)
CO2: 28 mmol/L (ref 22–32)
Calcium: 8.8 mg/dL — ABNORMAL LOW (ref 8.9–10.3)
Chloride: 107 mmol/L (ref 98–111)
Creatinine, Ser: 1.03 mg/dL — ABNORMAL HIGH (ref 0.44–1.00)
GFR, Estimated: >60 mL/min (ref >60)
Glucose, Bld: 90 mg/dL (ref 70–99)
Potassium: 3.4 mmol/L — ABNORMAL LOW (ref 3.5–5.1)
Sodium: 141 mmol/L (ref 135–145)
Total Bilirubin: 0.3 mg/dL (ref 0.3–1.2)
Total Protein: 5.3 g/dL — ABNORMAL LOW (ref 6.5–8.1)

## 2021-10-12 LAB — URINALYSIS, ROUTINE W REFLEX MICROSCOPIC
Bilirubin Urine: NEGATIVE
Glucose, UA: NEGATIVE mg/dL
Hgb urine dipstick: NEGATIVE
Ketones, ur: NEGATIVE mg/dL
Leukocytes,Ua: NEGATIVE
Nitrite: NEGATIVE
Protein, ur: NEGATIVE mg/dL
Specific Gravity, Urine: 1.004 — ABNORMAL LOW (ref 1.005–1.030)
pH: 6 (ref 5.0–8.0)

## 2021-10-12 NOTE — ED Notes (Signed)
Pt given [facility]

## 2021-10-12 NOTE — ED Triage Notes (Signed)
Pt has R inguinal hernia for approximately one year. Pt states that in the last week she has noticed increased bulging and pain to the area. Pt denies fevers, N/V/D.

## 2021-10-12 NOTE — ED Provider Notes (Signed)
Emergency Medicine Provider Triage Evaluation Note  Regina Daniel , a 50 y.o. female  was evaluated in triage.  Pt complains of worsening right inguinal hernia for the past week and a half.  Patient ports she has had the hernia for the past 1 to 2 years.  She reports that she has pain in that area whenever she eats.  Denies any new trauma to the area.  Denies any other abdominal pain or nausea/vomiting.  Last BM she reports was normal denies any urinary symptoms.  Review of Systems  Positive: Right lower quadrant pain Negative: Nausea, vomiting, fevers, diarrhea, constipation  Physical Exam  BP 90/62   Pulse 65   Temp 98.1 F (36.7 C)   Resp 18   Ht 5\' 2"  (1.575 m)   Wt 45.4 kg   LMP 11/04/2010   SpO2 100%   BMI 18.29 kg/m  Gen:   Awake, no distress   Resp:  Normal effort  MSK:   Moves extremities without difficulty  Other:  Abdomen soft,hernia soft and reducible  Medical Decision Making  Medically screening exam initiated at 1:54 PM.  Appropriate orders placed.  Regina Daniel was informed that the remainder of the evaluation will be completed by another provider, this initial triage assessment does not replace that evaluation, and the importance of remaining in the ED until their evaluation is complete.     Margo Aye, PA-C 10/12/21 1356    14/09/22, MD 10/12/21 1650

## 2021-10-12 NOTE — Discharge Instructions (Signed)
As we discussed, your suffering from an intermittent inguinal hernia.  Please read through the included discharge instructions.  Although you do not need urgent or emergent surgery, it is very important that you follow-up with a surgeon as indicated in these documents to discuss definitive treatment of this condition with a surgical procedure. ° °Please apply pressure to the affected area with your hand when you are getting ready to cough, sneeze, get out of bed, or otherwise strain.  This may help prevent the hernia from "popping out".  He should also look at your local pharmacy to see if they have a binder or other holster-like device that applies constant pressure to the area. ° °Take regular over-the-counter pain medicine as indicated on the labels and any prescriptions provided today.  Follow-up as indicated in these documents.  Return to the emergency department if he develop new or worsening pain or if you are concerned that the hernia has come out and is stuck, resulting in nausea and vomiting, inability to have bowel movement or passed gas, fever or chills, or other symptoms that concern you. ° ° °

## 2021-10-12 NOTE — ED Provider Notes (Signed)
[facility]   CSN: [id number] Arrival date & time: 10/12/21  1236     History Chief Complaint  Patient presents with   Inguinal Hernia    Regina Daniel is a 50 y.o. female.  The history is provided by the patient.  Abdominal Pain Pain location:  RLQ Pain quality: fullness   Pain radiates to:  Does not radiate Pain severity:  Moderate Onset quality:  Gradual Duration: 2 years. Timing:  Intermittent Progression:  Worsening Context comment:  Known right inguinal hernia Relieved by:  Nothing Worsened by:  Eating Associated symptoms: no chest pain, no chills, no cough, no dysuria, no fever, no hematuria, no shortness of breath, no sore throat and no vomiting       Past Medical History:  Diagnosis Date   Drug abuse in remission (HCC)    Left wrist fracture JAN 6.2017   Ruptured extensor tendon of hand or wrist    Tobacco dependence     Patient Active Problem List   Diagnosis Date Noted   MDD (major depressive disorder), recurrent severe, without psychosis (HCC) 08/09/2021   Rupture of extensor tendons of left hand and wrist 02/25/2019   Pyelonephritis 12/20/2017   Acute pain of left shoulder 01/23/2017   Traumatic closed displaced fracture of distal end of left radius with malunion 03/29/2016   Distal radius fracture 03/29/2016    Past Surgical History:  Procedure Laterality Date   CHOLECYSTECTOMY     NASAL SEPTUM SURGERY  MARCH 2017   ORIF WRIST FRACTURE Left 03/29/2016   Procedure: LEFT WRIST OSTEOTOMY WITH OPEN REDUCTION INTERNAL FIXATION (ORIF) ;  Surgeon: Kathryne Hitchhristopher Y Blackman, MD;  Location: [facility];  Service: Orthopedics;  Laterality: Left;  Left Intra-clavicular block   TUBAL LIGATION     WRIST SURGERY Right YEARS AGO   TENDON REPAIR     OB History   No obstetric history on file.     No family history on file.  Social History   Tobacco Use   Smoking status: Every Day    Packs/day: 0.50     Years: 32.00    Pack years: 16.00    Types: Cigarettes   Smokeless tobacco: Never   Tobacco comments:    USING E CIGARETTE NOW  Vaping Use   Vaping Use: Former  Substance Use Topics   Alcohol use: No   Drug use: No    Types: Cocaine    Comment: Current every other day use.    Home Medications Prior to Admission medications   Medication Sig Start Date End Date Taking? Authorizing Provider  escitalopram (LEXAPRO) 10 MG tablet Take 1 tablet (10 mg total) by mouth daily. Patient not taking: Reported on 10/12/2021 08/11/21   Maryagnes AmosStarkes-Perry, Takia S, FNP    Allergies    Patient has no known allergies.  Review of Systems   Review of Systems  Constitutional:  Negative for chills and fever.  HENT:  Negative for ear pain and sore throat.   Eyes:  Negative for pain and visual disturbance.  Respiratory:  Negative for cough and shortness of breath.   Cardiovascular:  Negative for chest pain and palpitations.  Gastrointestinal:  Positive for abdominal pain. Negative for vomiting.  Genitourinary:  Negative for dysuria and hematuria.  Musculoskeletal:  Negative for arthralgias and back pain.  Skin:  Negative for color change and rash.  Neurological:  Negative for seizures and syncope.  All other systems reviewed and are negative.  Physical  Exam Updated Vital Signs BP (!) 113/44   Pulse (!) 59   Temp 98.9 F (37.2 C)   Resp 19   Ht 5\' 2"  (1.575 m)   Wt 45.4 kg   LMP 11/04/2010   SpO2 98%   BMI 18.29 kg/m   Physical Exam Vitals and nursing note reviewed.  Constitutional:      General: She is not in acute distress.    Appearance: Normal appearance. She is well-developed.  HENT:     Head: Normocephalic and atraumatic.     Right Ear: External ear normal.     Left Ear: External ear normal.     Nose: Nose normal. No congestion or rhinorrhea.     Mouth/Throat:     Mouth: Mucous membranes are moist.  Eyes:     Extraocular Movements: Extraocular movements intact.      Conjunctiva/sclera: Conjunctivae normal.     Pupils: Pupils are equal, round, and reactive to light.  Cardiovascular:     Rate and Rhythm: Normal rate and regular rhythm.     Pulses: Normal pulses.     Heart sounds: No murmur heard. Pulmonary:     Effort: Pulmonary effort is normal. No respiratory distress.     Breath sounds: Normal breath sounds. No wheezing, rhonchi or rales.  Abdominal:     General: Abdomen is flat. Bowel sounds are normal.     Palpations: Abdomen is soft.     Tenderness: There is abdominal tenderness (Right lower quadrant.  No discernible bulges or irreducible masses.  Questionable palpable defect in the right inguinal region). There is no guarding or rebound.  Musculoskeletal:        General: No swelling, tenderness or deformity.     Cervical back: Normal range of motion and neck supple. No rigidity.  Skin:    General: Skin is warm and dry.     Capillary Refill: Capillary refill takes less than 2 seconds.  Neurological:     General: No focal deficit present.     Mental Status: She is alert and oriented to person, place, and time.  Psychiatric:        Mood and Affect: Mood normal.    ED Results / Procedures / Treatments   Labs (all labs ordered are listed, but only abnormal results are displayed) Labs Reviewed  COMPREHENSIVE METABOLIC PANEL - Abnormal; Notable for the following components:      Result Value   Potassium 3.4 (*)    Creatinine, Ser 1.03 (*)    Calcium 8.8 (*)    Total Protein 5.3 (*)    Albumin 3.4 (*)    All other components within normal limits  URINALYSIS, ROUTINE W REFLEX MICROSCOPIC - Abnormal; Notable for the following components:   Color, Urine STRAW (*)    Specific Gravity, Urine 1.004 (*)    All other components within normal limits  CBC WITH DIFFERENTIAL/PLATELET - Abnormal; Notable for the following components:   RBC 3.81 (*)    All other components within normal limits    EKG None  Radiology No results  found.  Procedures Procedures   Medications Ordered in ED Medications - No data to display  ED Course  I have reviewed the triage vital signs and the nursing notes.  Pertinent labs & imaging results that were available during my care of the patient were reviewed by me and considered in my medical decision making (see chart for details).    MDM Rules/Calculators/A&P  50 year old female with acute on chronic right lower quadrant pain.  Concern for worsening right inguinal hernia.  She is afebrile and hemodynamically stable.  No obvious bulges or masses on exam.  No overlying skin changes.  No concern for incarceration or strangulation.  Chemistry revealed normal electrolytes and reassuring creatinine.  White count normal.  Hemoglobin stable.  UA showed no signs of acute infection.  CT abdomen pelvis pending. Anticipate dc home w/ close surgery f/u to discuss elective repair if CT showed evidence of incarceration, strangulation, or obstruction. Patient comfortable w/ plan. Care of this pt transitioned to the oncoming provider awaiting CT.   Final Clinical Impression(s) / ED Diagnoses Final diagnoses:  Right inguinal hernia    Rx / DC Orders ED Discharge Orders     None        Lutricia Feil, MD 10/12/21 2336    Maia Plan, MD 10/13/21 1319

## 2021-10-13 ENCOUNTER — Emergency Department (HOSPITAL_COMMUNITY): Payer: 59

## 2021-10-13 MED ORDER — HYDROCODONE-ACETAMINOPHEN 5-325 MG PO TABS: 1.0000 | ORAL_TABLET | ORAL | 0 refills | Status: DC | PRN

## 2021-10-13 MED ORDER — KETOROLAC TROMETHAMINE 15 MG/ML IJ SOLN
15.0000 mg | Freq: Once | INTRAMUSCULAR | Status: AC
  Administered 2021-10-13: 15 mg via INTRAMUSCULAR
  Filled 2021-10-13: qty 1

## 2021-10-13 MED ORDER — ONDANSETRON 4 MG PO TBDP: 4.0000 mg | ORAL_TABLET | Freq: Three times a day (TID) | ORAL | 0 refills | Status: DC | PRN

## 2021-10-13 MED ORDER — IOHEXOL 350 MG/ML SOLN
100.0000 mL | Freq: Once | INTRAVENOUS | Status: AC | PRN
  Administered 2021-10-13: 100 mL via INTRAVENOUS

## 2021-10-13 NOTE — ED Notes (Signed)
RN reviewed discharge instructions with pt. Pt verbalized understanding and had no further questions. VSS upon discharge.  

## 2021-10-13 NOTE — ED Notes (Signed)
Patient transported to CT 

## 2021-10-13 NOTE — ED Notes (Signed)
Patient is resting comfortably. 

## 2021-10-13 NOTE — ED Provider Notes (Signed)
Pt signed out pending CT scan.    IMPRESSION:  Findings may represent gastritis. Clinical correlation recommended.  No bowel obstruction. Normal appendix.   No evidence of incarceration or obstruction.  Pt is feeling better and is tolerating po.  She is stable for d/c.  She is to f/u with surgery.  Return if worse.   Jacalyn Lefevre, MD 10/13/21 [phone]

## 2021-10-18 ENCOUNTER — Ambulatory Visit: Payer: Self-pay | Admitting: Surgery

## 2021-10-23 ENCOUNTER — Telehealth: Payer: Self-pay | Admitting: Surgery

## 2021-10-23 ENCOUNTER — Other Ambulatory Visit: Payer: Self-pay

## 2021-10-23 ENCOUNTER — Ambulatory Visit: Payer: Self-pay | Admitting: Surgery

## 2021-10-23 ENCOUNTER — Ambulatory Visit (INDEPENDENT_AMBULATORY_CARE_PROVIDER_SITE_OTHER): Payer: 59 | Admitting: Surgery

## 2021-10-23 ENCOUNTER — Encounter: Payer: Self-pay | Admitting: Surgery

## 2021-10-23 VITALS — BP 107/64 | HR 67 | Temp 98.0°F | Ht 62.75 in | Wt 110.2 lb

## 2021-10-23 DIAGNOSIS — K4090 Unilateral inguinal hernia, without obstruction or gangrene, not specified as recurrent: Secondary | ICD-10-CM

## 2021-10-23 NOTE — Telephone Encounter (Signed)
Patient has been advised of Pre-Admission date/time, COVID Testing date and Surgery date.  Surgery Date: 11/12/21 Preadmission Testing Date: 11/01/21 (phone 8a-1p) Covid Testing Date: Not needed.     Patient has been made aware to call [phone], between 1-3:00pm the day before surgery, to find out what time to arrive for surgery.

## 2021-10-23 NOTE — Progress Notes (Signed)
Patient ID: Regina Daniel, female   DOB: November 24, 1970, 50 y.o.   MRN: [id number]  Chief Complaint: Right groin pain  History of Present Illness Regina Daniel is a 50 y.o. female with a 2-year history of right inguinal hernia.  Progressively worsening with pain, she currently reports a 10 out of 10.  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  She denies any voiding or bowel movement challenges.  She denies any history of nausea, vomiting, fevers or chills.  She has had a prior hernia repair.  She also indicates having some other diffuse mid abdominal pain, which is worse after eating.  She has had a cholecystectomy in the past.  Past Medical History Past Medical History:  Diagnosis Date   Drug abuse in remission [facility])    Left wrist fracture JAN 6.2017   Ruptured extensor tendon of hand or wrist    Tobacco dependence       Past Surgical History:  Procedure Laterality Date   CHOLECYSTECTOMY     NASAL SEPTUM SURGERY  MARCH 2017   ORIF WRIST FRACTURE Left 03/29/2016   Procedure: LEFT WRIST OSTEOTOMY WITH OPEN REDUCTION INTERNAL FIXATION (ORIF) ;  Surgeon: Mcarthur Rossetti, MD;  Location: [facility];  Service: Orthopedics;  Laterality: Left;  Left Intra-clavicular block   TUBAL LIGATION     WRIST SURGERY Right YEARS AGO   TENDON REPAIR    No Known Allergies  No current outpatient medications on file.   No current facility-administered medications for this visit.    Family History History reviewed. No pertinent family history.    Social History Social History   Tobacco Use   Smoking status: Every Day    Packs/day: 0.50    Years: 32.00    Pack years: 16.00    Types: Cigarettes   Smokeless tobacco: Never   Tobacco comments:    USING E CIGARETTE NOW  Vaping Use   Vaping Use: Former  Substance Use Topics   Alcohol use: No   Drug use: No    Types: Cocaine    Comment: Current every other day use.        Review of Systems   Constitutional: Negative.   HENT:  Positive for hearing loss.   Eyes: Negative.   Respiratory: Negative.    Cardiovascular: Negative.   Gastrointestinal:  Positive for abdominal pain.  Genitourinary: Negative.   Skin: Negative.   Neurological: Negative.   Psychiatric/Behavioral:  Positive for depression.      Physical Exam Blood pressure 107/64, pulse 67, temperature 98 F (36.7 C), temperature source Oral, height 5' 2.75" (1.594 m), weight 110 lb 3.2 oz (50 kg), last menstrual period 11/04/2010, SpO2 99 %. Last Weight  Most recent update: 10/23/2021  9:49 AM    Weight  50 kg (110 lb 3.2 oz)             CONSTITUTIONAL: Well developed, and nourished, appropriately responsive and aware without distress.   EYES: Sclera non-icteric.   EARS, NOSE, MOUTH AND THROAT: Mask worn.   Hearing is intact to voice.  NECK: Trachea is midline, and there is no jugular venous distension.  LYMPH NODES:  Lymph nodes in the neck are not enlarged. RESPIRATORY:  Lungs are clear, and breath sounds are equal bilaterally. Normal respiratory effort without pathologic use of accessory muscles. CARDIOVASCULAR: Heart is regular in rate and rhythm. GI: The abdomen is soft, nontender, and nondistended. There were no palpable masses. I  did not appreciate hepatosplenomegaly.  GU: She has a palpable right groin bulge, exacerbated with Valsalva.  No appreciable left groin bulge present. MUSCULOSKELETAL:  Symmetrical muscle tone appreciated in all four extremities.    SKIN: Skin turgor is normal. No pathologic skin lesions appreciated.  NEUROLOGIC:  Motor and sensation appear grossly normal.  Cranial nerves are grossly without defect. PSYCH:  Alert and oriented to person, place and time. Affect is appropriate for situation.  Data Reviewed I have personally reviewed what is currently available of the patient's imaging, recent labs and medical records.   Labs:  CBC Latest Ref Rng & Units 10/12/2021 08/09/2021  10/01/2020  WBC 4.0 - 10.5 K/uL 5.5 7.1 5.9  Hemoglobin 12.0 - 15.0 g/dL 96.7 89.3 81.0  Hematocrit 36.0 - 46.0 % 37.8 39.2 38.5  Platelets 150 - 400 K/uL 187 224 197   CMP Latest Ref Rng & Units 10/12/2021 10/01/2020 01/13/2020  Glucose 70 - 99 mg/dL 90 175(Z) 90  BUN 6 - 20 mg/dL 6 18 02(H)  Creatinine 0.44 - 1.00 mg/dL 8.52(D) 7.82 4.23(N)  Sodium 135 - 145 mmol/L 141 137 138  Potassium 3.5 - 5.1 mmol/L 3.4(L) 3.6 3.3(L)  Chloride 98 - 111 mmol/L 107 104 96(L)  CO2 22 - 32 mmol/L 28 25 25   Calcium 8.9 - 10.3 mg/dL ) 9.1 10.4(H)  Total Protein 6.5 - 8.1 g/dL 5.3(L) 6.6 8.8(H)  Total Bilirubin 0.3 - 1.2 mg/dL 0.3 3.6(R) 1.2  Alkaline Phos 38 - 126 U/L 78 77 108  AST 15 - 41 U/L 18 16 26   ALT 0 - 44 U/L 13 13 21       Imaging: Radiology review:  CLINICAL DATA:  Abdominal pain.   EXAM: CT ABDOMEN AND PELVIS WITH CONTRAST   TECHNIQUE: Multidetector CT imaging of the abdomen and pelvis was performed using the standard protocol following bolus administration of intravenous contrast.   CONTRAST:  4.4(R OMNIPAQUE IOHEXOL 350 MG/ML SOLN   COMPARISON:  CT abdomen pelvis dated 07/14/2020.   FINDINGS: Lower chest: Several nodules involving the lung bases measure up to 5 mm in the left lung base.   No intra-abdominal free air. Diffuse mesenteric edema and trace fluid within the pelvis.   Hepatobiliary: The liver is unremarkable. No intrahepatic biliary dilatation. Cholecystectomy.   Pancreas: Unremarkable. No pancreatic ductal dilatation or surrounding inflammatory changes.   Spleen: Normal in size without focal abnormality.   Adrenals/Urinary Tract: The adrenal glands unremarkable. There is no hydronephrosis on either side. There is symmetric enhancement and excretion of contrast by both kidneys. The urinary bladder is grossly unremarkable.   Stomach/Bowel: The stomach is moderately distended with ingested content. There is mild thickened appearance of the gastric  wall primarily involving the distal stomach and pylorus. There is haziness of the mesentery hand fat surrounding the stomach. Findings may represent mild gastritis. Clinical correlation is recommended. There is no bowel obstruction. The appendix is normal.   Vascular/Lymphatic: The abdominal aorta and IVC are unremarkable. No portal venous gas. There is no adenopathy.   Reproductive: The uterus is anteverted.  No adnexal masses.   Other: None   Musculoskeletal: No acute or significant osseous findings.   IMPRESSION: Findings may represent gastritis. Clinical correlation recommended. No bowel obstruction. Normal appendix.     Electronically Signed   By: M.D.   On: 10/13/2021 00:39 Within last 24 hrs: No results found.  Assessment    Right inguinal hernia Patient Active Problem List   Diagnosis Date Noted  MDD (major depressive disorder), recurrent severe, without psychosis ([place]) 08/09/2021   Rupture of extensor tendons of left hand and wrist 02/25/2019   Pyelonephritis 12/20/2017   Acute pain of left shoulder 01/23/2017   Traumatic closed displaced fracture of distal end of left radius with malunion 03/29/2016   Distal radius fracture 03/29/2016    Plan    Robotic repair of right inguinal hernia.  I discussed possibility of incarceration, strangulation, enlargement in size over time, and the need for emergency surgery in the face of these.  Also reviewed the techniques of reduction should incarceration occur, and when unsuccessful to present to the ED.  Also discussed that surgery risks include recurrence which can be up to 30% in the case of complex hernias, use of prosthetic materials (mesh) and the increased risk of infection and the possible need for re-operation and removal of mesh, possibility of post-op SBO or ileus, and the risks of general anesthetic including heart attack, stroke, sudden death or some reaction to anesthetic medications. The  patient, and those present, appear to understand the risks, any and all questions were answered to the patient's satisfaction.  No guarantees were ever expressed or implied.   Face-to-face time spent with the patient and accompanying care providers(if present) was 30 minutes, with more than 50% of the time spent counseling, educating, and coordinating care of the patient.    These notes generated with voice recognition software. I apologize for typographical errors.  Ronny Bacon M.D., FACS 10/23/2021, 10:16 AM

## 2021-10-23 NOTE — Patient Instructions (Addendum)
Our surgery scheduler Barbara will call you within 24-48 hours to get you scheduled. If you have not heard from her after 48 hours, please call our office. You will not need to get Covid tested before surgery and have the blue sheet available when she calls to write down important information.  If you have any concerns or questions, please feel free to call our office.   Inguinal Hernia, Adult An inguinal hernia is when fat or your intestines push through a weak spot in a muscle where your leg meets your lower belly (groin). This causes a bulge. This kind of hernia could also be: In your scrotum, if you are female. In folds of skin around your vagina, if you are female. There are three types of inguinal hernias: Hernias that can be pushed back into the belly (are reducible). This type rarely causes pain. Hernias that cannot be pushed back into the belly (are incarcerated). Hernias that cannot be pushed back into the belly and lose their blood supply (are strangulated). This type needs emergency surgery. What are the causes? This condition is caused by having a weak spot in the muscles or tissues in your groin. This develops over time. The hernia may poke through the weak spot when you strain your lower belly muscles all of a sudden, such as when you: Lift a heavy object. Strain to poop (have a bowel movement). Trouble pooping (constipation) can lead to straining. Cough. What increases the risk? This condition is more likely to develop in: Males. Pregnant females. People who: Are overweight. Work in jobs that require long periods of standing or heavy lifting. Have had an inguinal hernia before. Smoke or have lung disease. These factors can lead to long-term (chronic) coughing. What are the signs or symptoms? Symptoms may depend on the size of the hernia. Often, a small hernia has no symptoms. Symptoms of a larger hernia may include: A bulge in the groin area. This is easier to see when  standing. You might not be able to see it when you are lying down. Pain or burning in the groin. This may get worse when you lift, strain, or cough. A dull ache or a feeling of pressure in the groin. An abnormal bulge in the scrotum, in males. Symptoms of a strangulated inguinal hernia may include: A bulge in your groin that is very painful and tender to the touch. A bulge that turns red or purple. Fever, feeling like you may vomit (nausea), and vomiting. Not being able to poop or to pass gas. How is this treated? Treatment depends on the size of your hernia and whether you have symptoms. If you do not have symptoms, your doctor may have you watch your hernia carefully and have you come in for follow-up visits. If your hernia is large or if you have symptoms, you may need surgery to repair the hernia. Follow these instructions at home: Lifestyle Avoid lifting heavy objects. Avoid standing for long amounts of time. Do not smoke or use any products that contain nicotine or tobacco. If you need help quitting, ask your doctor. Stay at a healthy weight. Prevent trouble pooping You may need to take these actions to prevent or treat trouble pooping: Drink enough fluid to keep your pee (urine) pale yellow. Take over-the-counter or prescription medicines. Eat foods that are high in fiber. These include beans, whole grains, and fresh fruits and vegetables. Limit foods that are high in fat and sugar. These include fried or sweet foods. General   instructions You may try to push your hernia back in place by very gently pressing on it when you are lying down. Do not try to push the bulge back in if it will not go in easily. Watch your hernia for any changes in shape, size, or color. Tell your doctor if you see any changes. Take over-the-counter and prescription medicines only as told by your doctor. Keep all follow-up visits. Contact a doctor if: You have a fever or chills. You have new  symptoms. Your symptoms get worse. Get help right away if: You have pain in your groin that gets worse all of a sudden. You have a bulge in your groin that: Gets bigger all of a sudden, and it does not get smaller after that. Turns red or purple. Is painful when you touch it. You are a female, and you have: Sudden pain in your scrotum. A sudden change in the size of your scrotum. You cannot push the hernia back in place by very gently pressing on it when you are lying down. You feel like you may vomit, and that feeling does not go away. You keep vomiting. You have a fast heartbeat. You cannot poop or pass gas. These symptoms may be an emergency. Get help right away. Call your local emergency services (911 in the [place] Do not wait to see if the symptoms will go away. Do not drive yourself to the hospital. Summary An inguinal hernia is when fat or your intestines push through a weak spot in a muscle where your leg meets your lower belly (groin). This causes a bulge. If you do not have symptoms, you may not need treatment. If you have symptoms or a large hernia, you may need surgery. Avoid lifting heavy objects. Also, avoid standing for long amounts of time. Do not try to push the bulge back in if it will not go in easily. This information is not intended to replace advice given to you by your health care provider. Make sure you discuss any questions you have with your health care provider. Document Revised: 06/20/2020 Document Reviewed: 06/20/2020 Elsevier Patient Education  2022 [employer]  

## 2021-11-01 ENCOUNTER — Other Ambulatory Visit
Admission: RE | Admit: 2021-11-01 | Discharge: 2021-11-01 | Disposition: A | Discharge status: Home / Self Care | Payer: 59 | Source: Ambulatory Visit | Attending: Surgery | Admitting: Surgery

## 2021-11-01 ENCOUNTER — Other Ambulatory Visit: Payer: Self-pay

## 2021-11-01 DIAGNOSIS — F1911 Other psychoactive substance abuse, in remission: Secondary | ICD-10-CM

## 2021-11-01 HISTORY — DX: Anxiety disorder, unspecified: F41.9

## 2021-11-01 HISTORY — DX: Anemia, unspecified: D64.9

## 2021-11-01 HISTORY — DX: Pneumonia, unspecified organism: J18.9

## 2021-11-01 NOTE — Patient Instructions (Signed)
Your procedure is scheduled on: 11/12/21 - Monday Report to the Registration Desk on the 1st floor of the [facility]. To find out your arrival time, please call [phone] between 1PM - 3PM on: 11/09/21 - Friday   REMEMBER: Instructions that are not followed completely may result in serious medical risk, up to and including death; or upon the discretion of your surgeon and anesthesiologist your surgery may need to be rescheduled.  Do not eat food or drink any fluid after midnight the night before surgery.  No gum chewing, lozengers or hard candies.  TAKE THESE MEDICATIONS THE MORNING OF SURGERY WITH A SIP OF WATER: NONE  One week prior to surgery: Stop Anti-inflammatories (NSAIDS) such as Advil, Aleve, Ibuprofen, Motrin, Naproxen, Naprosyn and Aspirin based products such as Excedrin, Goodys Powder, BC Powder.  Stop ANY OVER THE COUNTER supplements until after surgery.  You may however, continue to take Tylenol if needed for pain up until the day of surgery.  No Alcohol for 24 hours before or after surgery.  No Smoking including e-cigarettes for 24 hours prior to surgery.  No chewable tobacco products for at least 6 hours prior to surgery.  No nicotine patches on the day of surgery.  Do not use any "recreational" drugs for at least a week prior to your surgery.  Please be advised that the combination of cocaine and anesthesia may have negative outcomes, up to and including death. If you test positive for cocaine, your surgery will be cancelled.  On the morning of surgery brush your teeth with toothpaste and water, you may rinse your mouth with mouthwash if you wish. Do not swallow any toothpaste or mouthwash.  Use CHG Soap or wipes as directed on instruction sheet.  Do not wear jewelry, make-up, hairpins, clips or nail polish.  Do not wear lotions, powders, or perfumes.   Do not shave body from the neck down 48 hours prior to surgery just in case you cut yourself which  could leave a site for infection.  Also, freshly shaved skin may become irritated if using the CHG soap.  Contact lenses, hearing aids and dentures may not be worn into surgery.  Do not bring valuables to the hospital. [facility] is not responsible for any missing/lost belongings or valuables.   Notify your doctor if there is any change in your medical condition (cold, fever, infection).  Wear comfortable clothing (specific to your surgery type) to the hospital.  After surgery, you can help prevent lung complications by doing breathing exercises.  Take deep breaths and cough every 1-2 hours. Your doctor may order a device called an Incentive Spirometer to help you take deep breaths. When coughing or sneezing, hold a pillow firmly against your incision with both hands. This is called splinting. Doing this helps protect your incision. It also decreases belly discomfort.  If you are being admitted to the hospital overnight, leave your suitcase in the car. After surgery it may be brought to your room.  If you are being discharged the day of surgery, you will not be allowed to drive home. You will need a responsible adult (18 years or older) to drive you home and stay with you that night.   If you are taking public transportation, you will need to have a responsible adult (18 years or older) with you. Please confirm with your physician that it is acceptable to use public transportation.   Please call the Pre-admissions Testing Dept. at [phone] if you have  any questions about these instructions.  Surgery Visitation Policy:  Patients undergoing a surgery or procedure may have one family member or support person with them as long as that person is not COVID-19 positive or experiencing its symptoms.  That person may remain in the waiting area during the procedure and may rotate out with other people.  Inpatient Visitation:    Visiting hours are 7 a.m. to 8 p.m. Up to two visitors  ages 16+ are allowed at one time in a patient room. The visitors may rotate out with other people during the day. Visitors must check out when they leave, or other visitors will not be allowed. One designated support person may remain overnight. The visitor must pass COVID-19 screenings, use hand sanitizer when entering and exiting the patients room and wear a mask at all times, including in the patients room. Patients must also wear a mask when staff or their visitor are in the room. Masking is required regardless of vaccination status.

## 2021-11-08 ENCOUNTER — Telehealth: Payer: Self-pay | Admitting: Surgery

## 2021-11-08 NOTE — Telephone Encounter (Signed)
Pt called advising she will need to move her current surgery date from 11/12/21 to 11/14/21, as her sister is her caretaker & is not available on 11/12/21.  Thank you

## 2021-11-09 ENCOUNTER — Telehealth: Payer: Self-pay | Admitting: Surgery

## 2021-11-09 NOTE — Telephone Encounter (Signed)
Updated information regarding rescheduled surgery at patient's request.    Patient has been advised of Pre-Admission date/time, COVID Testing date and Surgery date.  Surgery Date: 11/30/21 Preadmission Testing Date: 11/01/21 already done.   Covid Testing Date: Not needed.    Patient has been made aware to call [phone], between 1-3:00pm the day before surgery, to find out what time to arrive for surgery.

## 2021-11-30 ENCOUNTER — Ambulatory Visit
Admission: RE | Admit: 2021-11-30 | Discharge: 2021-11-30 | Disposition: A | Discharge status: Home / Self Care | Payer: 59 | Attending: Surgery | Admitting: Surgery

## 2021-11-30 ENCOUNTER — Encounter: Payer: Self-pay | Admitting: Anesthesiology

## 2021-11-30 ENCOUNTER — Encounter: Payer: Self-pay | Admitting: Surgery

## 2021-11-30 ENCOUNTER — Encounter
Admission: RE | Disposition: A | Discharge status: Home / Self Care | Payer: Self-pay | Source: Home / Self Care | Attending: Surgery

## 2021-11-30 ENCOUNTER — Other Ambulatory Visit: Payer: Self-pay

## 2021-11-30 DIAGNOSIS — K4090 Unilateral inguinal hernia, without obstruction or gangrene, not specified as recurrent: Secondary | ICD-10-CM | POA: Insufficient documentation

## 2021-11-30 DIAGNOSIS — Z538 Procedure and treatment not carried out for other reasons: Secondary | ICD-10-CM | POA: Diagnosis not present

## 2021-11-30 DIAGNOSIS — F1911 Other psychoactive substance abuse, in remission: Secondary | ICD-10-CM | POA: Diagnosis not present

## 2021-11-30 LAB — URINE DRUG SCREEN, QUALITATIVE (ARMC ONLY)
Amphetamines, Ur Screen: NOT DETECTED
Barbiturates, Ur Screen: NOT DETECTED
Benzodiazepine, Ur Scrn: NOT DETECTED
Cannabinoid 50 Ng, Ur ~~LOC~~: NOT DETECTED
Cocaine Metabolite,Ur ~~LOC~~: POSITIVE — AB
MDMA (Ecstasy)Ur Screen: NOT DETECTED
Methadone Scn, Ur: NOT DETECTED
Opiate, Ur Screen: NOT DETECTED
Phencyclidine (PCP) Ur S: NOT DETECTED
Tricyclic, Ur Screen: NOT DETECTED

## 2021-11-30 SURGERY — HERNIORRHAPHY, INGUINAL, ROBOT-ASSISTED, LAPAROSCOPIC
Anesthesia: General | Laterality: Right

## 2021-11-30 MED ORDER — OXYCODONE HCL 5 MG/5ML PO SOLN: 5.0000 mg | Freq: Once | ORAL | Status: DC | PRN

## 2021-11-30 MED ORDER — CHLORHEXIDINE GLUCONATE 0.12 % MT SOLN
OROMUCOSAL | Status: AC
  Filled 2021-11-30: qty 15

## 2021-11-30 MED ORDER — BUPIVACAINE LIPOSOME 1.3 % IJ SUSP: 20.0000 mL | Freq: Once | INTRAMUSCULAR | Status: DC

## 2021-11-30 MED ORDER — ACETAMINOPHEN 10 MG/ML IV SOLN: 1000.0000 mg | Freq: Once | INTRAVENOUS | Status: DC | PRN

## 2021-11-30 MED ORDER — FENTANYL CITRATE (PF) 100 MCG/2ML IJ SOLN
INTRAMUSCULAR | Status: AC
  Filled 2021-11-30: qty 2

## 2021-11-30 MED ORDER — LACTATED RINGERS IV SOLN: INTRAVENOUS | Status: DC

## 2021-11-30 MED ORDER — OXYCODONE HCL 5 MG PO TABS: 5.0000 mg | ORAL_TABLET | Freq: Once | ORAL | Status: DC | PRN

## 2021-11-30 MED ORDER — CHLORHEXIDINE GLUCONATE CLOTH 2 % EX PADS
6.0000 | MEDICATED_PAD | Freq: Once | CUTANEOUS | Status: AC
  Administered 2021-11-30: 6 via TOPICAL

## 2021-11-30 MED ORDER — MIDAZOLAM HCL 2 MG/2ML IJ SOLN
INTRAMUSCULAR | Status: AC
  Filled 2021-11-30: qty 2

## 2021-11-30 MED ORDER — CHLORHEXIDINE GLUCONATE 0.12 % MT SOLN: 15.0000 mL | Freq: Once | OROMUCOSAL | Status: DC

## 2021-11-30 MED ORDER — ONDANSETRON HCL 4 MG/2ML IJ SOLN: 4.0000 mg | Freq: Once | INTRAMUSCULAR | Status: DC | PRN

## 2021-11-30 MED ORDER — CEFAZOLIN SODIUM-DEXTROSE 2-4 GM/100ML-% IV SOLN
INTRAVENOUS | Status: AC
  Filled 2021-11-30: qty 100

## 2021-11-30 MED ORDER — GABAPENTIN 300 MG PO CAPS
ORAL_CAPSULE | ORAL | Status: AC
  Filled 2021-11-30: qty 1

## 2021-11-30 MED ORDER — ORAL CARE MOUTH RINSE: 15.0000 mL | Freq: Once | OROMUCOSAL | Status: DC

## 2021-11-30 MED ORDER — FAMOTIDINE 20 MG PO TABS: 20.0000 mg | ORAL_TABLET | Freq: Once | ORAL | Status: DC

## 2021-11-30 MED ORDER — FENTANYL CITRATE (PF) 100 MCG/2ML IJ SOLN: 25.0000 ug | INTRAMUSCULAR | Status: DC | PRN

## 2021-11-30 MED ORDER — CELECOXIB 200 MG PO CAPS
200.0000 mg | ORAL_CAPSULE | ORAL | Status: AC
  Administered 2021-11-30: 200 mg via ORAL

## 2021-11-30 MED ORDER — ACETAMINOPHEN 500 MG PO TABS
1000.0000 mg | ORAL_TABLET | ORAL | Status: AC
  Administered 2021-11-30: 1000 mg via ORAL

## 2021-11-30 MED ORDER — ACETAMINOPHEN 500 MG PO TABS
ORAL_TABLET | ORAL | Status: AC
  Filled 2021-11-30: qty 2

## 2021-11-30 MED ORDER — CHLORHEXIDINE GLUCONATE CLOTH 2 % EX PADS: 6.0000 | MEDICATED_PAD | Freq: Once | CUTANEOUS | Status: DC

## 2021-11-30 MED ORDER — GABAPENTIN 300 MG PO CAPS
300.0000 mg | ORAL_CAPSULE | ORAL | Status: AC
  Administered 2021-11-30: 300 mg via ORAL

## 2021-11-30 MED ORDER — CEFAZOLIN SODIUM-DEXTROSE 2-4 GM/100ML-% IV SOLN: 2.0000 g | INTRAVENOUS | Status: DC

## 2021-11-30 MED ORDER — CELECOXIB 200 MG PO CAPS
ORAL_CAPSULE | ORAL | Status: AC
  Filled 2021-11-30: qty 1

## 2021-11-30 SURGICAL SUPPLY — 44 items
BLADE CLIPPER SURG (BLADE) ×2 IMPLANT
CANNULA CAP OBTURATR AIRSEAL 8 (CAP) ×2 IMPLANT
CHLORAPREP W/TINT 26 (MISCELLANEOUS) ×2 IMPLANT
COVER TIP SHEARS 8 DVNC (MISCELLANEOUS) ×1 IMPLANT
COVER TIP SHEARS 8MM DA VINCI (MISCELLANEOUS) ×1
COVER WAND RF STERILE (DRAPES) ×2 IMPLANT
DEFOGGER SCOPE WARMER CLEARIFY (MISCELLANEOUS) ×2 IMPLANT
DERMABOND ADVANCED (GAUZE/BANDAGES/DRESSINGS) ×1
DERMABOND ADVANCED .7 DNX12 (GAUZE/BANDAGES/DRESSINGS) ×1 IMPLANT
DRAPE 3/4 80X56 (DRAPES) ×2 IMPLANT
DRAPE ARM DVNC X/XI (DISPOSABLE) ×3 IMPLANT
DRAPE COLUMN DVNC XI (DISPOSABLE) ×1 IMPLANT
DRAPE DA VINCI XI ARM (DISPOSABLE) ×3
DRAPE DA VINCI XI COLUMN (DISPOSABLE) ×1
ELECT REM PT RETURN 9FT ADLT (ELECTROSURGICAL) ×2
ELECTRODE REM PT RTRN 9FT ADLT (ELECTROSURGICAL) ×1 IMPLANT
GLOVE SURG ORTHO LTX SZ7.5 (GLOVE) ×6 IMPLANT
GOWN STRL REUS W/ TWL LRG LVL3 (GOWN DISPOSABLE) ×3 IMPLANT
GOWN STRL REUS W/TWL LRG LVL3 (GOWN DISPOSABLE) ×3
GRASPER SUT TROCAR 14GX15 (MISCELLANEOUS) IMPLANT
IRRIGATION STRYKERFLOW (MISCELLANEOUS) IMPLANT
IRRIGATOR STRYKERFLOW (MISCELLANEOUS)
IV CATH ANGIO 14GX1.88 NO SAFE (IV SOLUTION) ×2 IMPLANT
IV NS 1000ML (IV SOLUTION)
IV NS 1000ML BAXH (IV SOLUTION) IMPLANT
KIT PINK PAD W/HEAD ARE REST (MISCELLANEOUS) ×2
KIT PINK PAD W/HEAD ARM REST (MISCELLANEOUS) ×1 IMPLANT
LABEL OR SOLS (LABEL) ×2 IMPLANT
MANIFOLD NEPTUNE II (INSTRUMENTS) ×2 IMPLANT
NEEDLE HYPO 22GX1.5 SAFETY (NEEDLE) ×2 IMPLANT
NEEDLE INSUFFLATION 14GA 120MM (NEEDLE) IMPLANT
PACK LAP CHOLECYSTECTOMY (MISCELLANEOUS) ×2 IMPLANT
SEAL CANN UNIV 5-8 DVNC XI (MISCELLANEOUS) ×2 IMPLANT
SEAL XI 5MM-8MM UNIVERSAL (MISCELLANEOUS) ×2
SET TUBE FILTERED XL AIRSEAL (SET/KITS/TRAYS/PACK) ×2 IMPLANT
SOLUTION ELECTROLUBE (MISCELLANEOUS) ×2 IMPLANT
SUT MNCRL 4-0 (SUTURE) ×1
SUT MNCRL 4-0 27XMFL (SUTURE) ×1
SUT VIC AB 0 CT2 27 (SUTURE) ×2 IMPLANT
SUT VLOC 90 2/L VL 12 GS22 (SUTURE) IMPLANT
SUT VLOC 90 S/L VL9 GS22 (SUTURE) ×2 IMPLANT
SUTURE MNCRL 4-0 27XMF (SUTURE) ×1 IMPLANT
TROCAR Z-THREAD FIOS 11X100 BL (TROCAR) IMPLANT
WATER STERILE IRR 500ML POUR (IV SOLUTION) ×2 IMPLANT

## 2021-11-30 NOTE — Anesthesia Preprocedure Evaluation (Deleted)
Anesthesia Evaluation  Patient identified by MRN, date of birth, ID band Patient awake    Reviewed: Allergy & Precautions, NPO status , Patient's Chart, lab work & pertinent test results  History of Anesthesia Complications Negative for: history of anesthetic complications  Airway Mallampati: II   Neck ROM: Full    Dental   Several chipped teeth:   Pulmonary Current Smoker (1/2 ppd)Patient did not abstain from smoking.,    Pulmonary exam normal breath sounds clear to auscultation       Cardiovascular Exercise Tolerance: Good negative cardio ROS Normal cardiovascular exam Rhythm:Regular Rate:Normal     Neuro/Psych PSYCHIATRIC DISORDERS Anxiety Hx cocaine abuse, pt states last use 1 year ago    GI/Hepatic negative GI ROS,   Endo/Other  negative endocrine ROS  Renal/GU negative Renal ROS     Musculoskeletal   Abdominal   Peds  Hematology  (+) Blood dyscrasia, anemia ,   Anesthesia Other Findings   Reproductive/Obstetrics                            Anesthesia Physical Anesthesia Plan  ASA: 3  Anesthesia Plan: General   Post-op Pain Management:    Induction: Intravenous  PONV Risk Score and Plan: 2 and Ondansetron, Dexamethasone and Treatment may vary due to age or medical condition  Airway Management Planned: Oral ETT  Additional Equipment:   Intra-op Plan:   Post-operative Plan: Extubation in OR  Informed Consent: I have reviewed the patients History and Physical, chart, labs and discussed the procedure including the risks, benefits and alternatives for the proposed anesthesia with the patient or authorized representative who has indicated his/her understanding and acceptance.     Dental advisory given  Plan Discussed with: CRNA  Anesthesia Plan Comments: (Patient consented for risks of anesthesia including but not limited to:  - adverse reactions to medications -  damage to eyes, teeth, lips or other oral mucosa - nerve damage due to positioning  - sore throat or hoarseness - damage to heart, brain, nerves, lungs, other parts of body or loss of life  Informed patient about role of CRNA in peri- and intra-operative care.  Patient voiced understanding.  Case canceled 11/30/21 for positive cocaine UDS.)       Anesthesia Quick Evaluation

## 2021-11-30 NOTE — Progress Notes (Signed)
Spoke with Dr Ronni Rumble to notify her for pt's urine drug test is positive for cocaine. MD verbalized an order to cancel her surgery. Continue to monitor.

## 2021-12-03 ENCOUNTER — Telehealth: Payer: Self-pay | Admitting: Surgery

## 2021-12-03 NOTE — Telephone Encounter (Signed)
Updated information regarding rescheduled surgery due to positive cocaine use.   Patient has been reminded that she will be drug tested again prior to surgery and verbalized understanding.   Patient has been advised of Pre-Admission date/time, COVID Testing date and Surgery date.  Surgery Date: 12/26/21 Preadmission Testing Date: 12/17/21 (phone 8a-1p) Covid Testing Date: Not needed.     Patient has been made aware to call [phone], between 1-3:00pm the day before surgery, to find out what time to arrive for surgery.

## 2021-12-10 ENCOUNTER — Other Ambulatory Visit (HOSPITAL_COMMUNITY)
Admission: EM | Admit: 2021-12-10 | Discharge: 2021-12-11 | Disposition: A | Discharge status: Home / Self Care | Payer: 59 | Attending: Psychiatry | Admitting: Psychiatry

## 2021-12-10 DIAGNOSIS — R450 Nervousness: Secondary | ICD-10-CM | POA: Diagnosis not present

## 2021-12-10 DIAGNOSIS — F419 Anxiety disorder, unspecified: Secondary | ICD-10-CM | POA: Insufficient documentation

## 2021-12-10 DIAGNOSIS — F1020 Alcohol dependence, uncomplicated: Secondary | ICD-10-CM | POA: Insufficient documentation

## 2021-12-10 DIAGNOSIS — Y900 Blood alcohol level of less than 20 mg/100 ml: Secondary | ICD-10-CM | POA: Diagnosis not present

## 2021-12-10 DIAGNOSIS — Z560 Unemployment, unspecified: Secondary | ICD-10-CM | POA: Insufficient documentation

## 2021-12-10 DIAGNOSIS — Z638 Other specified problems related to primary support group: Secondary | ICD-10-CM | POA: Diagnosis not present

## 2021-12-10 DIAGNOSIS — Z91410 Personal history of adult physical and sexual abuse: Secondary | ICD-10-CM | POA: Insufficient documentation

## 2021-12-10 DIAGNOSIS — F17210 Nicotine dependence, cigarettes, uncomplicated: Secondary | ICD-10-CM | POA: Diagnosis not present

## 2021-12-10 DIAGNOSIS — Z5900 Homelessness unspecified: Secondary | ICD-10-CM | POA: Insufficient documentation

## 2021-12-10 DIAGNOSIS — F1410 Cocaine abuse, uncomplicated: Secondary | ICD-10-CM | POA: Diagnosis not present

## 2021-12-10 DIAGNOSIS — F1910 Other psychoactive substance abuse, uncomplicated: Secondary | ICD-10-CM | POA: Diagnosis present

## 2021-12-10 DIAGNOSIS — R4587 Impulsiveness: Secondary | ICD-10-CM | POA: Insufficient documentation

## 2021-12-10 DIAGNOSIS — Z20822 Contact with and (suspected) exposure to covid-19: Secondary | ICD-10-CM | POA: Insufficient documentation

## 2021-12-10 DIAGNOSIS — Z634 Disappearance and death of family member: Secondary | ICD-10-CM | POA: Diagnosis not present

## 2021-12-10 DIAGNOSIS — F332 Major depressive disorder, recurrent severe without psychotic features: Secondary | ICD-10-CM | POA: Diagnosis not present

## 2021-12-10 LAB — POCT URINE DRUG SCREEN - MANUAL ENTRY (I-SCREEN)
POC Amphetamine UR: NOT DETECTED
POC Buprenorphine (BUP): NOT DETECTED
POC Cocaine UR: POSITIVE — AB
POC Marijuana UR: NOT DETECTED
POC Methadone UR: NOT DETECTED
POC Methamphetamine UR: NOT DETECTED
POC Morphine: NOT DETECTED
POC Oxazepam (BZO): NOT DETECTED
POC Oxycodone UR: NOT DETECTED
POC Secobarbital (BAR): NOT DETECTED

## 2021-12-10 LAB — CBC WITH DIFFERENTIAL/PLATELET
Abs Immature Granulocytes: 0.02 10*3/uL (ref 0.00–0.07)
Basophils Absolute: 0.1 10*3/uL (ref 0.0–0.1)
Basophils Relative: 1 %
Eosinophils Absolute: 0.4 10*3/uL (ref 0.0–0.5)
Eosinophils Relative: 5 %
HCT: 44.6 % (ref 36.0–46.0)
Hemoglobin: 15.1 g/dL — ABNORMAL HIGH (ref 12.0–15.0)
Immature Granulocytes: 0 %
Lymphocytes Relative: 30 %
Lymphs Abs: 2.2 10*3/uL (ref 0.7–4.0)
MCH: 32.1 pg (ref 26.0–34.0)
MCHC: 33.9 g/dL (ref 30.0–36.0)
MCV: 94.9 fL (ref 80.0–100.0)
Monocytes Absolute: 0.6 10*3/uL (ref 0.1–1.0)
Monocytes Relative: 8 %
Neutro Abs: 4.1 10*3/uL (ref 1.7–7.7)
Neutrophils Relative %: 56 %
Platelets: 230 10*3/uL (ref 150–400)
RBC: 4.7 MIL/uL (ref 3.87–5.11)
RDW: 12.1 % (ref 11.5–15.5)
WBC: 7.3 10*3/uL (ref 4.0–10.5)
nRBC: 0 % (ref 0.0–0.2)

## 2021-12-10 LAB — HEMOGLOBIN A1C
Hgb A1c MFr Bld: 5.1 % (ref 4.8–5.6)
Mean Plasma Glucose: 99.67 mg/dL

## 2021-12-10 LAB — POCT PREGNANCY, URINE: Preg Test, Ur: NEGATIVE

## 2021-12-10 LAB — COMPREHENSIVE METABOLIC PANEL
ALT: 16 U/L (ref 0–44)
AST: 22 U/L (ref 15–41)
Albumin: 5.1 g/dL — ABNORMAL HIGH (ref 3.5–5.0)
Alkaline Phosphatase: 113 U/L (ref 38–126)
Anion gap: 11 (ref 5–15)
BUN: 12 mg/dL (ref 6–20)
CO2: 26 mmol/L (ref 22–32)
Calcium: 9.8 mg/dL (ref 8.9–10.3)
Chloride: 102 mmol/L (ref 98–111)
Creatinine, Ser: 0.95 mg/dL (ref 0.44–1.00)
GFR, Estimated: >60 mL/min (ref >60)
Glucose, Bld: 82 mg/dL (ref 70–99)
Potassium: 3.5 mmol/L (ref 3.5–5.1)
Sodium: 139 mmol/L (ref 135–145)
Total Bilirubin: 0.5 mg/dL (ref 0.3–1.2)
Total Protein: 7.4 g/dL (ref 6.5–8.1)

## 2021-12-10 LAB — RESP PANEL BY RT-PCR (FLU A&B, COVID) ARPGX2
Influenza A by PCR: NEGATIVE
Influenza B by PCR: NEGATIVE
SARS Coronavirus 2 by RT PCR: NEGATIVE

## 2021-12-10 LAB — ETHANOL: Alcohol, Ethyl (B): <10 mg/dL (ref <10)

## 2021-12-10 LAB — LIPID PANEL
Cholesterol: 233 mg/dL — ABNORMAL HIGH (ref 0–200)
HDL: 77 mg/dL (ref >40)
LDL Cholesterol: 134 mg/dL — ABNORMAL HIGH (ref 0–99)
Total CHOL/HDL Ratio: 3 RATIO
Triglycerides: 110 mg/dL (ref <150)
VLDL: 22 mg/dL (ref 0–40)

## 2021-12-10 LAB — POC SARS CORONAVIRUS 2 AG -  ED: SARS Coronavirus 2 Ag: NEGATIVE

## 2021-12-10 LAB — TSH: TSH: 3.024 u[IU]/mL (ref 0.350–4.500)

## 2021-12-10 MED ORDER — ACETAMINOPHEN 325 MG PO TABS
650.0000 mg | ORAL_TABLET | Freq: Four times a day (QID) | ORAL | Status: DC | PRN
  Administered 2021-12-10: 650 mg via ORAL
  Filled 2021-12-10: qty 2

## 2021-12-10 MED ORDER — MAGNESIUM HYDROXIDE 400 MG/5ML PO SUSP: 30.0000 mL | Freq: Every day | ORAL | Status: DC | PRN

## 2021-12-10 MED ORDER — HYDROXYZINE HCL 25 MG PO TABS: 25.0000 mg | ORAL_TABLET | Freq: Three times a day (TID) | ORAL | Status: DC | PRN

## 2021-12-10 MED ORDER — NICOTINE 21 MG/24HR TD PT24
21.0000 mg | MEDICATED_PATCH | Freq: Every day | TRANSDERMAL | Status: DC
  Administered 2021-12-10 – 2021-12-11 (×2): 21 mg via TRANSDERMAL
  Filled 2021-12-10 (×2): qty 1

## 2021-12-10 MED ORDER — SERTRALINE HCL 25 MG PO TABS
25.0000 mg | ORAL_TABLET | Freq: Every day | ORAL | Status: DC
  Administered 2021-12-10 – 2021-12-11 (×2): 25 mg via ORAL
  Filled 2021-12-10 (×2): qty 1

## 2021-12-10 MED ORDER — ALUM & MAG HYDROXIDE-SIMETH 200-200-20 MG/5ML PO SUSP: 30.0000 mL | ORAL | Status: DC | PRN
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
## 2021-12-10 NOTE — ED Notes (Signed)
Pt is in the bed sleeping. Respirations are even and unlabored. No acute distress noted. Will continue to monitor for safety. 
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
## 2021-12-10 NOTE — ED Provider Notes (Signed)
Behavioral Health Progress Note  Date and Time: 12/10/2021 8:42 AM Name: Regina Daniel MRN:  [id number]  Subjective:  Regina Daniel, 51 y.o., female patient presented to [facility] requesting treatment for cocaine addiction and depressed symptoms. She was admitted to the continuous assessment unit for observation.  Patient seen face to face by this provider, Dr. Serafina Mitchell ; and  chart reviewed on 12/10/21.  Patient meets criteria for treatment in the [facility].  Patient is interested in residential substance abuse treatment.  Patient reports a history of cocaine abuse for the past 25 years.  UDS upon admission is positive for cocaine and BAL <10.  Reports she uses cocaine as much as she can it depends on her access.  She is unsure of the amount that she is using.  Reports periods of sobriety when she has been in prison.  Reports she has been diagnosed with bipolar, depression, and anxiety.  She does not take any medications at this time.  She does not have outpatient psychiatric services in place.  She is living with her ex-boyfriend.  Reports the loss of her son in 08/2019 continues to trigger her depression and cocaine use.  She has a hernia that needs to be surgically repaired on 12/26/2021 .She called [facility] and was told that the surgery will not interrupt her residential treatment stay if she were to be accepted into the program. It is a day surgery and she would only have to stay one night in the hospital to be monitored because she doesn't have anyone who can. Of note, surgery has been delayed due to not passing presurgical drug test.  She denies any withdrawal symptoms at this time.  During evaluation Regina Daniel is sitting position.  She is not in any acute distress.  She is alert/oriented x4 and cooperative Her speech is clear, coherent, and normal rate and tone. She endorses anxiety and depression with feelings of hopelessness, helplessness, irritability, decreased sleep and appetite. She denies  suicidal/self-harm/homicidal ideations.  She contracts for safety.  Denies access to weapons/firearms.  She denies HI.  She does not appear to be responding to internal/external stimuli.  She contracts for safety and denies access to firearms/weapons.  She denies AVH.    Diagnosis:  Final diagnoses:  Cocaine abuse (HCC)  MDD (major depressive disorder), recurrent severe, without psychosis ([place])    Total Time spent with patient: 20 minutes  Past Psychiatric History: See H&P Past Medical History:  Past Medical History:  Diagnosis Date   Anemia    Anxiety    situational   Drug abuse ([place])    Drug abuse in remission ([place])    Left wrist fracture 11/10/2015   Pneumonia    Ruptured extensor tendon of hand or wrist    Tobacco dependence     Past Surgical History:  Procedure Laterality Date   CHOLECYSTECTOMY     NASAL SEPTUM SURGERY  MARCH 2017   ORIF WRIST FRACTURE Left 03/29/2016   Procedure: LEFT WRIST OSTEOTOMY WITH OPEN REDUCTION INTERNAL FIXATION (ORIF) ;  Surgeon: Mcarthur Rossetti, MD;  Location: [facility];  Service: Orthopedics;  Laterality: Left;  Left Intra-clavicular block   TUBAL LIGATION     WRIST SURGERY Right YEARS AGO   TENDON REPAIR   Family History: No family history on file. Family Psychiatric  History: See H&P Social History:  Social History   Substance and Sexual Activity  Alcohol Use No     Social History   Substance and  Sexual Activity  Drug Use No   Types: Cocaine   Comment: Current every other day use.    Social History   Socioeconomic History   Marital status: Divorced    Spouse name: Not on file   Number of children: Not on file   Years of education: Not on file   Highest education level: Not on file  Occupational History   Not on file  Tobacco Use   Smoking status: Every Day    Packs/day: 0.50    Years: 32.00    Pack years: 16.00    Types: Cigarettes   Smokeless tobacco: Never   Tobacco comments:    USING E CIGARETTE NOW   Vaping Use   Vaping Use: Former  Substance and Sexual Activity   Alcohol use: No   Drug use: No    Types: Cocaine    Comment: Current every other day use.   Sexual activity: Not on file  Other Topics Concern   Not on file  Social History Narrative   Not on file   Social Determinants of Health   Financial Resource Strain: Not on file  Food Insecurity: Not on file  Transportation Needs: Not on file  Physical Activity: Not on file  Stress: Not on file  Social Connections: Not on file   SDOH:  SDOH Screenings   Alcohol Screen: Not on file  Depression (PHQ2-9): Not on file  Financial Resource Strain: Not on file  Food Insecurity: Not on file  Housing: Not on file  Physical Activity: Not on file  Social Connections: Not on file  Stress: Not on file  Tobacco Use: High Risk   Smoking Tobacco Use: Every Day   Smokeless Tobacco Use: Never   Passive Exposure: Not on file  Transportation Needs: Not on file   Additional Social History:    Pain Medications: See MAR Prescriptions: See MAR Over the Counter: See MAR History of alcohol / drug use?: Yes Longest period of sobriety (when/how long): 2008-2009 while incarcerated Negative Consequences of Use: Financial, Legal, Personal relationships, Work / School Withdrawal Symptoms: Irritability Name of Substance 1: Cocaine 1 - Age of First Use: 25 1 - Amount (size/oz): Varies 1 - Frequency: Daily when available 1 - Duration: 25 years 1 - Last Use / Amount: 1 hour before arriving to [facility] 1 - Method of Aquiring: Unknown 1- Route of Use: Smoking                  Sleep: Fair  Appetite:  Fair  Current Medications:  Current Facility-Administered Medications  Medication Dose Route Frequency Provider Last Rate Last Admin   acetaminophen (TYLENOL) tablet 650 mg  650 mg Oral Q6H PRN Ajibola, Ene A, NP       alum & mag hydroxide-simeth (MAALOX/MYLANTA) 200-200-20 MG/5ML suspension 30 mL  30 mL Oral Q4H PRN Ajibola, Ene A,  NP       hydrOXYzine (ATARAX) tablet 25 mg  25 mg Oral TID PRN Ajibola, Ene A, NP       magnesium hydroxide (MILK OF MAGNESIA) suspension 30 mL  30 mL Oral Daily PRN Ajibola, Ene A, NP       nicotine (NICODERM CQ - dosed in mg/24 hours) patch 21 mg  21 mg Transdermal Daily Revonda Humphrey, NP       No current outpatient medications on file.    Labs  Lab Results:  Admission on 12/10/2021  Component Date Value Ref Range Status   WBC 12/10/2021 7.3  4.0 - 10.5 K/uL Final   RBC 12/10/2021 4.70  3.87 - 5.11 MIL/uL Final   Hemoglobin 12/10/2021 15.1 (H)  12.0 - 15.0 g/dL Final   HCT 12/10/2021 44.6  36.0 - 46.0 % Final   MCV 12/10/2021 94.9  80.0 - 100.0 fL Final   MCH 12/10/2021 32.1  26.0 - 34.0 pg Final   MCHC 12/10/2021 33.9  30.0 - 36.0 g/dL Final   RDW 12/10/2021 12.1  11.5 - 15.5 % Final   Platelets 12/10/2021 230  150 - 400 K/uL Final   nRBC 12/10/2021 0.0  0.0 - 0.2 % Final   Neutrophils Relative % 12/10/2021 56  % Final   Neutro Abs 12/10/2021 4.1  1.7 - 7.7 K/uL Final   Lymphocytes Relative 12/10/2021 30  % Final   Lymphs Abs 12/10/2021 2.2  0.7 - 4.0 K/uL Final   Monocytes Relative 12/10/2021 8  % Final   Monocytes Absolute 12/10/2021 0.6  0.1 - 1.0 K/uL Final   Eosinophils Relative 12/10/2021 5  % Final   Eosinophils Absolute 12/10/2021 0.4  0.0 - 0.5 K/uL Final   Basophils Relative 12/10/2021 1  % Final   Basophils Absolute 12/10/2021 0.1  0.0 - 0.1 K/uL Final   Immature Granulocytes 12/10/2021 0  % Final   Abs Immature Granulocytes 12/10/2021 0.02  0.00 - 0.07 K/uL Final   Performed at [facility], [place] [address] [place], [place] [address]   Sodium 12/10/2021 139  135 - 145 mmol/L Final   Potassium 12/10/2021 3.5  3.5 - 5.1 mmol/L Final   Chloride 12/10/2021 102  98 - 111 mmol/L Final   CO2 12/10/2021 26  22 - 32 mmol/L Final   Glucose, Bld 12/10/2021 82  70 - 99 mg/dL Final   Glucose reference range applies only to samples taken after fasting for at least  8 hours.   BUN 12/10/2021 12  6 - 20 mg/dL Final   Creatinine, Ser 12/10/2021 0.95  0.44 - 1.00 mg/dL Final   Calcium 12/10/2021 9.8  8.9 - 10.3 mg/dL Final   Total Protein 12/10/2021 7.4  6.5 - 8.1 g/dL Final   Albumin 12/10/2021 5.1 (H)  3.5 - 5.0 g/dL Final   AST 12/10/2021 22  15 - 41 U/L Final   ALT 12/10/2021 16  0 - 44 U/L Final   Alkaline Phosphatase 12/10/2021 113  38 - 126 U/L Final   Total Bilirubin 12/10/2021 0.5  0.3 - 1.2 mg/dL Final   GFR, Estimated 12/10/2021 >60  >60 mL/min Final   Comment: (NOTE) Calculated using the CKD-EPI Creatinine Equation (2021)    Anion gap 12/10/2021 11  5 - 15 Final   Performed at [facility] [address], [place], [place] [address]   Hgb A1c MFr Bld 12/10/2021 5.1  4.8 - 5.6 % Final   Comment: (NOTE) Pre diabetes:          5.7%-6.4%  Diabetes:              >6.4%  Glycemic control for   <7.0% adults with diabetes    Mean Plasma Glucose 12/10/2021 99.67  mg/dL Final   Performed at [facility], [place] [address], [place], [place] [address]   Alcohol, Ethyl (B) 12/10/2021 <10  <10 mg/dL Final   Comment: (NOTE) Lowest detectable limit for serum alcohol is 10 mg/dL.  For medical purposes only. Performed at [facility], [place] [address] [place], [place] [address]  TSH 12/10/2021 3.024  0.350 - 4.500 uIU/mL Final   Comment: Performed by a 3rd Generation assay with a functional sensitivity of <=0.01 uIU/mL. Performed at [facility], [place] [address] [place], [place] [address]    POC Amphetamine UR 12/10/2021 None Detected  NONE DETECTED (Cut Off Level 1000 ng/mL) Final   POC Secobarbital (BAR) 12/10/2021 None Detected  NONE DETECTED (Cut Off Level 300 ng/mL) Final   POC Buprenorphine (BUP) 12/10/2021 None Detected  NONE DETECTED (Cut Off Level 10 ng/mL) Final   POC Oxazepam (BZO) 12/10/2021 None Detected  NONE DETECTED (Cut Off Level 300 ng/mL) Final   POC Cocaine UR 12/10/2021 Positive (A)  NONE DETECTED (Cut  Off Level 300 ng/mL) Final   POC Methamphetamine UR 12/10/2021 None Detected  NONE DETECTED (Cut Off Level 1000 ng/mL) Final   POC Morphine 12/10/2021 None Detected  NONE DETECTED (Cut Off Level 300 ng/mL) Final   POC Oxycodone UR 12/10/2021 None Detected  NONE DETECTED (Cut Off Level 100 ng/mL) Final   POC Methadone UR 12/10/2021 None Detected  NONE DETECTED (Cut Off Level 300 ng/mL) Final   POC Marijuana UR 12/10/2021 None Detected  NONE DETECTED (Cut Off Level 50 ng/mL) Final   Preg Test, Ur 12/10/2021 NEGATIVE  NEGATIVE Final   Comment:        THE SENSITIVITY OF THIS METHODOLOGY IS >24 mIU/mL    SARS Coronavirus 2 Ag 12/10/2021 Negative  Negative Preliminary   Cholesterol 12/10/2021 233 (H)  0 - 200 mg/dL Final   Triglycerides 12/10/2021 110  <150 mg/dL Final   HDL 12/10/2021 77  >40 mg/dL Final   Total CHOL/HDL Ratio 12/10/2021 3.0  RATIO Final   VLDL 12/10/2021 22  0 - 40 mg/dL Final   LDL Cholesterol 12/10/2021 134 (H)  0 - 99 mg/dL Final   Comment:        Total Cholesterol/HDL:CHD Risk Coronary Heart Disease Risk Table                     Men   Women  1/2 Average Risk   3.4   3.3  Average Risk       5.0   4.4  2 X Average Risk   9.6   7.1  3 X Average Risk  23.4   11.0        Use the calculated Patient Ratio above and the CHD Risk Table to determine the patient's CHD Risk.        ATP III CLASSIFICATION (LDL):  <100     mg/dL   Optimal  100-129  mg/dL   Near or Above                    Optimal  130-159  mg/dL   Borderline  160-189  mg/dL   High  >190     mg/dL   Very High Performed at [facility] [address], [place], [place] [address]   Admission on 11/30/2021, Discharged on 11/30/2021  Component Date Value Ref Range Status   Tricyclic, Ur Screen [id number] NONE DETECTED  NONE DETECTED Final   Amphetamines, Ur Screen 11/30/2021 NONE DETECTED  NONE DETECTED Final   MDMA (Ecstasy)Ur Screen 11/30/2021 NONE DETECTED  NONE DETECTED Final   Cocaine  Metabolite,Ur [place] 11/30/2021 POSITIVE (A)  NONE DETECTED Final   Opiate, Ur Screen 11/30/2021 NONE DETECTED  NONE DETECTED Final   Phencyclidine (PCP) Ur S 11/30/2021 NONE DETECTED  NONE DETECTED Final  Cannabinoid 50 Ng, Ur [place] 11/30/2021 NONE DETECTED  NONE DETECTED Final   Barbiturates, Ur Screen 11/30/2021 NONE DETECTED  NONE DETECTED Final   Benzodiazepine, Ur Scrn 11/30/2021 NONE DETECTED  NONE DETECTED Final   Methadone Scn, Ur 11/30/2021 NONE DETECTED  NONE DETECTED Final   Comment: (NOTE) Tricyclics + metabolites, urine    Cutoff 1000 ng/mL Amphetamines + metabolites, urine  Cutoff 1000 ng/mL MDMA (Ecstasy), urine              Cutoff 500 ng/mL Cocaine Metabolite, urine          Cutoff 300 ng/mL Opiate + metabolites, urine        Cutoff 300 ng/mL Phencyclidine (PCP), urine         Cutoff 25 ng/mL Cannabinoid, urine                 Cutoff 50 ng/mL Barbiturates + metabolites, urine  Cutoff 200 ng/mL Benzodiazepine, urine              Cutoff 200 ng/mL Methadone, urine                   Cutoff 300 ng/mL  The urine drug screen provides only a preliminary, unconfirmed analytical test result and should not be used for non-medical purposes. Clinical consideration and professional judgment should be applied to any positive drug screen result due to possible interfering substances. A more specific alternate chemical method must be used in order to obtain a confirmed analytical result. Gas chromatography / mass spectrometry (GC/MS) is the preferred confirm                          atory method. Performed at [facility], [place] [place], [place] [address]   Admission on 10/12/2021, Discharged on 10/13/2021  Component Date Value Ref Range Status   Sodium 10/12/2021 141  135 - 145 mmol/L Final   Potassium 10/12/2021 3.4 (L)  3.5 - 5.1 mmol/L Final   Chloride 10/12/2021 107  98 - 111 mmol/L Final   CO2 10/12/2021 28  22 - 32 mmol/L Final   Glucose, Bld 10/12/2021 90   70 - 99 mg/dL Final   Glucose reference range applies only to samples taken after fasting for at least 8 hours.   BUN 10/12/2021 6  6 - 20 mg/dL Final   Creatinine, Ser 10/12/2021 1.03 (H)  0.44 - 1.00 mg/dL Final   Calcium 10/12/2021 8.8 (L)  8.9 - 10.3 mg/dL Final   Total Protein 10/12/2021 5.3 (L)  6.5 - 8.1 g/dL Final   Albumin 10/12/2021 3.4 (L)  3.5 - 5.0 g/dL Final   AST 10/12/2021 18  15 - 41 U/L Final   ALT 10/12/2021 13  0 - 44 U/L Final   Alkaline Phosphatase 10/12/2021 78  38 - 126 U/L Final   Total Bilirubin 10/12/2021 0.3  0.3 - 1.2 mg/dL Final   GFR, Estimated 10/12/2021 >60  >60 mL/min Final   Comment: (NOTE) Calculated using the CKD-EPI Creatinine Equation (2021)    Anion gap 10/12/2021 6  5 - 15 Final   Performed at [facility] [address] [place], [place] [address]   Color, Urine 10/12/2021 STRAW (A)  YELLOW Final   APPearance 10/12/2021 CLEAR  CLEAR Final   Specific Gravity, Urine 10/12/2021 1.004 (L)  1.005 - 1.030 Final   pH 10/12/2021 6.0  5.0 - 8.0 Final   Glucose, UA 10/12/2021 NEGATIVE  NEGATIVE mg/dL Final   Hgb urine dipstick 10/12/2021 NEGATIVE  NEGATIVE Final   Bilirubin Urine 10/12/2021 NEGATIVE  NEGATIVE Final   Ketones, ur 10/12/2021 NEGATIVE  NEGATIVE mg/dL Final   Protein, ur 44/92/0100 NEGATIVE  NEGATIVE mg/dL Final   Nitrite 71/21/9758 NEGATIVE  NEGATIVE Final   Leukocytes,Ua 10/12/2021 NEGATIVE  NEGATIVE Final   Performed at [facility], [address], [place], [place] [address]   WBC 10/12/2021 5.5  4.0 - 10.5 K/uL Final   RBC 10/12/2021 3.81 (L)  3.87 - 5.11 MIL/uL Final   Hemoglobin 10/12/2021 12.2  12.0 - 15.0 g/dL Final   HCT 98/26/4158 37.8  36.0 - 46.0 % Final   MCV 10/12/2021 99.2  80.0 - 100.0 fL Final   MCH 10/12/2021 32.0  26.0 - 34.0 pg Final   MCHC 10/12/2021 32.3  30.0 - 36.0 g/dL Final   RDW 30/94/0768 12.5  11.5 - 15.5 % Final   Platelets 10/12/2021 187  150 - 400 K/uL Final   nRBC 10/12/2021 0.0  0.0 -  0.2 % Final   Neutrophils Relative % 10/12/2021 58  % Final   Neutro Abs 10/12/2021 3.2  1.7 - 7.7 K/uL Final   Lymphocytes Relative 10/12/2021 28  % Final   Lymphs Abs 10/12/2021 1.5  0.7 - 4.0 K/uL Final   Monocytes Relative 10/12/2021 7  % Final   Monocytes Absolute 10/12/2021 0.4  0.1 - 1.0 K/uL Final   Eosinophils Relative 10/12/2021 6  % Final   Eosinophils Absolute 10/12/2021 0.4  0.0 - 0.5 K/uL Final   Basophils Relative 10/12/2021 1  % Final   Basophils Absolute 10/12/2021 0.1  0.0 - 0.1 K/uL Final   Immature Granulocytes 10/12/2021 0  % Final   Abs Immature Granulocytes 10/12/2021 0.01  0.00 - 0.07 K/uL Final   Performed at [facility], [address], [place], [place] [address]  Admission on 08/09/2021, Discharged on 08/10/2021  Component Date Value Ref Range Status   SARS Coronavirus 2 by RT PCR 08/09/2021 NEGATIVE  NEGATIVE Final   Comment: (NOTE) SARS-CoV-2 target nucleic acids are NOT DETECTED.  The SARS-CoV-2 RNA is generally detectable in upper respiratory specimens during the acute phase of infection. The lowest concentration of SARS-CoV-2 viral copies this assay can detect is 138 copies/mL. A negative result does not preclude SARS-Cov-2 infection and should not be used as the sole basis for treatment or other patient management decisions. A negative result may occur with  improper specimen collection/handling, submission of specimen other than nasopharyngeal swab, presence of viral mutation(s) within the areas targeted by this assay, and inadequate number of viral copies(<138 copies/mL). A negative result must be combined with clinical observations, patient history, and epidemiological information. The expected result is Negative.  Fact Sheet for Patients:  BloggerCourse.com  Fact Sheet for Healthcare Providers:  SeriousBroker.it  This test is no                          t yet approved or cleared by the  [place] FDA and  has been authorized for detection and/or diagnosis of SARS-CoV-2 by FDA under an Emergency Use Authorization (EUA). This EUA will remain  in effect (meaning this test can be used) for the duration of the COVID-19 declaration under Section 564(b)(1) of the Act, 21 U.S.C.section 360bbb-3(b)(1), unless the authorization is terminated  or revoked sooner.       Influenza A by PCR 08/09/2021 NEGATIVE  NEGATIVE Final   Influenza B by PCR 08/09/2021 NEGATIVE  NEGATIVE Final   Comment: (NOTE) The Xpert Xpress SARS-CoV-2/FLU/RSV plus assay is intended as an aid in the diagnosis of influenza from Nasopharyngeal swab specimens and should not be used as a sole basis for treatment. Nasal washings and aspirates are unacceptable for Xpert Xpress SARS-CoV-2/FLU/RSV testing.  Fact Sheet for Patients: EntrepreneurPulse.com.au  Fact Sheet for Healthcare Providers: IncredibleEmployment.be  This test is not yet approved or cleared by the [place] FDA and has been authorized for detection and/or diagnosis of SARS-CoV-2 by FDA under an Emergency Use Authorization (EUA). This EUA will remain in effect (meaning this test can be used) for the duration of the COVID-19 declaration under Section 564(b)(1) of the Act, 21 U.S.C. section 360bbb-3(b)(1), unless the authorization is terminated or revoked.  Performed at [facility], [place] [address] [place], [place] [address]    WBC 08/09/2021 7.1  4.0 - 10.5 K/uL Final   RBC 08/09/2021 4.29  3.87 - 5.11 MIL/uL Final   Hemoglobin 08/09/2021 13.5  12.0 - 15.0 g/dL Final   HCT 08/09/2021 39.2  36.0 - 46.0 % Final   MCV 08/09/2021 91.4  80.0 - 100.0 fL Final   MCH 08/09/2021 31.5  26.0 - 34.0 pg Final   MCHC 08/09/2021 34.4  30.0 - 36.0 g/dL Final   RDW 08/09/2021 12.4  11.5 - 15.5 % Final   Platelets 08/09/2021 224  150 - 400 K/uL Final   nRBC 08/09/2021 0.0  0.0 - 0.2 % Final   Neutrophils  Relative % 08/09/2021 59  % Final   Neutro Abs 08/09/2021 4.2  1.7 - 7.7 K/uL Final   Lymphocytes Relative 08/09/2021 28  % Final   Lymphs Abs 08/09/2021 2.0  0.7 - 4.0 K/uL Final   Monocytes Relative 08/09/2021 9  % Final   Monocytes Absolute 08/09/2021 0.6  0.1 - 1.0 K/uL Final   Eosinophils Relative 08/09/2021 3  % Final   Eosinophils Absolute 08/09/2021 0.2  0.0 - 0.5 K/uL Final   Basophils Relative 08/09/2021 1  % Final   Basophils Absolute 08/09/2021 0.0  0.0 - 0.1 K/uL Final   Immature Granulocytes 08/09/2021 0  % Final   Abs Immature Granulocytes 08/09/2021 0.03  0.00 - 0.07 K/uL Final   Performed at [facility], [place] [address], [place], [place] [address]   Hgb A1c MFr Bld 08/09/2021 5.4  4.8 - 5.6 % Final   Comment: (NOTE) Pre diabetes:          5.7%-6.4%  Diabetes:              >6.4%  Glycemic control for   <7.0% adults with diabetes    Mean Plasma Glucose 08/09/2021 108.28  mg/dL Final   Performed at [facility], [place] [address] [place], [place] [address]   TSH 08/09/2021 0.929  0.350 - 4.500 uIU/mL Final   Comment: Performed by a 3rd Generation assay with a functional sensitivity of <=0.01 uIU/mL. Performed at [facility], [place] [address], [place], [place] [address]    Prolactin 08/09/2021 9.5  4.8 - 23.3 ng/mL Final   Comment: (NOTE) Performed At: [facility] [place], [place] [id number] Rush Farmer MD [id number]    POC Amphetamine UR 08/09/2021 None Detected  NONE DETECTED (Cut Off Level 1000 ng/mL) Final   POC Secobarbital (BAR) 08/09/2021 None Detected  NONE DETECTED (Cut Off Level 300 ng/mL) Final   POC  Buprenorphine (BUP) 08/09/2021 None Detected  NONE DETECTED (Cut Off Level 10 ng/mL) Final   POC Oxazepam (BZO) 08/09/2021 None Detected  NONE DETECTED (Cut Off Level 300 ng/mL) Final   POC Cocaine UR 08/09/2021 Positive (A)  NONE DETECTED (Cut Off Level 300 ng/mL) Final   POC Methamphetamine UR 08/09/2021 None  Detected  NONE DETECTED (Cut Off Level 1000 ng/mL) Final   POC Morphine 08/09/2021 None Detected  NONE DETECTED (Cut Off Level 300 ng/mL) Final   POC Oxycodone UR 08/09/2021 None Detected  NONE DETECTED (Cut Off Level 100 ng/mL) Final   POC Methadone UR 08/09/2021 None Detected  NONE DETECTED (Cut Off Level 300 ng/mL) Final   POC Marijuana UR 08/09/2021 None Detected  NONE DETECTED (Cut Off Level 50 ng/mL) Final   Preg Test, Ur 08/09/2021 NEGATIVE  NEGATIVE Final   Comment:        THE SENSITIVITY OF THIS METHODOLOGY IS >24 mIU/mL     Blood Alcohol level:  Lab Results  Component Value Date   ETH <10 12/10/2021   ETH  12/02/2010    <5        LOWEST DETECTABLE LIMIT FOR SERUM ALCOHOL IS 5 mg/dL FOR MEDICAL PURPOSES ONLY    Metabolic Disorder Labs: Lab Results  Component Value Date   HGBA1C 5.1 12/10/2021   MPG 99.67 12/10/2021   MPG 108.28 08/09/2021   Lab Results  Component Value Date   PROLACTIN 9.5 08/09/2021   Lab Results  Component Value Date   CHOL 233 (H) 12/10/2021   TRIG 110 12/10/2021   HDL 77 12/10/2021   CHOLHDL 3.0 12/10/2021   VLDL 22 12/10/2021   LDLCALC 134 (H) 12/10/2021    Therapeutic Lab Levels: No results found for: LITHIUM No results found for: VALPROATE No components found for:  CBMZ  Physical Findings   Flowsheet Row ED from 12/10/2021 in [facility] Admission (Discharged) from 11/30/2021 in [facility] from 11/01/2021 in [place] No Risk No Risk No Risk        Musculoskeletal  Strength & Muscle Tone: within normal limits Gait & Station: normal Patient leans: N/A  Psychiatric Specialty Exam  Presentation  General Appearance: Appropriate for Environment; Casual  Eye Contact:Good  Speech:Clear and Coherent; Normal Rate  Speech Volume:Normal  Handedness:Right   Mood and  Affect  Mood:Anxious  Affect:Congruent   Thought Process  Thought Processes:Coherent  Descriptions of Associations:Intact  Orientation:Full (Time, Place and Person)  Thought Content:Logical  Diagnosis of Schizophrenia or Schizoaffective disorder in past: No    Hallucinations:Hallucinations: None  Ideas of Reference:None  Suicidal Thoughts:Suicidal Thoughts: No  Homicidal Thoughts:Homicidal Thoughts: No   Sensorium  Memory:Immediate Good; Recent Good; Remote Good  Judgment:Good  Insight:Good   Executive Functions  Concentration:Good  Attention Span:Good  [place] of Knowledge:Good  Language:Good   Psychomotor Activity  Psychomotor Activity:Psychomotor Activity: Normal   Assets  Assets:Communication Skills; Desire for Improvement; Leisure Time; Physical Health; Resilience; Social Support   Sleep  Sleep:Sleep: Fair   Nutritional Assessment (For OBS and [facility]BC admissions only) Has the patient had a weight loss or gain of 10 pounds or more in the last 3 months?: No Has the patient had a decrease in food intake/or appetite?: No Does the patient have dental problems?: No Does the patient have eating habits or behaviors that may be indicators of an eating disorder including binging or inducing  vomiting?: No Has the patient recently lost weight without trying?: 0 Has the patient been eating poorly because of a decreased appetite?: 0 Malnutrition Screening Tool Score: 0    Physical Exam  Physical Exam Vitals and nursing note reviewed.  Constitutional:      General: She is not in acute distress.    Appearance: Normal appearance. She is not ill-appearing.  HENT:     Head: Normocephalic.  Eyes:     General:        Right eye: No discharge.        Left eye: No discharge.     Conjunctiva/sclera: Conjunctivae normal.  Cardiovascular:     Rate and Rhythm: Normal rate.  Pulmonary:     Effort: Pulmonary effort is normal.  Musculoskeletal:         General: Normal range of motion.     Cervical back: Normal range of motion.  Skin:    Coloration: Skin is not jaundiced or pale.  Neurological:     Mental Status: She is alert and oriented to person, place, and time.  Psychiatric:        Attention and Perception: Attention and perception normal.        Mood and Affect: Affect normal. Mood is anxious.        Speech: Speech normal.        Behavior: Behavior normal. Behavior is cooperative.        Thought Content: Thought content normal.        Cognition and Memory: Cognition normal.        Judgment: Judgment is impulsive.   Review of Systems  Constitutional: Negative.   HENT: Negative.    Eyes: Negative.   Respiratory: Negative.    Cardiovascular: Negative.   Genitourinary: Negative.   Musculoskeletal: Negative.   Skin: Negative.   Psychiatric/Behavioral:  The patient is nervous/anxious.   Blood pressure 112/68, pulse 71, temperature 98.2 F (36.8 C), temperature source Oral, resp. rate 16, last menstrual period 11/04/2010, SpO2 98 %. There is no height or weight on file to calculate BMI.  Treatment Plan Summary: Daily contact with patient to assess and evaluate symptoms and progress in treatment and Medication management  Patient meets criteria for treatment in the [facility].  Patient is interested in residential substance abuse treatment.  Revonda Humphrey, NP 12/10/2021 8:42 AM

## 2021-12-10 NOTE — ED Notes (Signed)
Refused to attend group.  

## 2021-12-10 NOTE — ED Notes (Signed)
Patient admitted from [facility].  She is calm and quiet appears dysphoric with constricted affect.  Mood sad thought process organized, judgement impaired.  Patient reports smoking crack cocaine this morning prior to arriving at facility.  She also reports having surgery scheduled on the 22nd for a hernia repair.  Patient must have a clean drug screen prior to surgery and so she came her in order to achieve that goal.  Patient appears tired.  She was oriented to the unit, given breakfast and shown to her room.  She asked for and was given toiletries and is now showering.  Patient denied avh shi or plan and was encouraged to seel out staff if overwhelmed by thoughts or feelings.  Will monitor and provide safe environment for her.

## 2021-12-10 NOTE — ED Notes (Signed)
Pending 2hr Covid result, then transfer to [facility]

## 2021-12-10 NOTE — ED Notes (Signed)
Called report to [facility] of pt's arrival to [facility]
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
## 2021-12-10 NOTE — ED Notes (Signed)
Pt is in the bed sleeping at present. Respirations are even and unlabored. No distress noted. Will continue to monitor for safety.

## 2021-12-10 NOTE — Progress Notes (Signed)
Patient continues to rest in her room at present no complaints or distress.  She denies avh shi or plan.  Makes needs known.

## 2021-12-10 NOTE — ED Notes (Signed)
Snacks given 

## 2021-12-10 NOTE — ED Notes (Signed)
Patient denies SI,HI,AVH. Patient is cooperative and interacts well with staff.  Respiratory is even and unlabored. No distress noted. Patient watching TV in the dinning room at present. Will continue to monitor for safety.

## 2021-12-10 NOTE — ED Provider Notes (Signed)
Behavioral Health Admission [facility] & OBS)  Date: 12/10/21 Patient Name: Regina Daniel MRN: [id number] Chief Complaint:  Chief Complaint  Patient presents with   Addiction Problem      Diagnoses:  Final diagnoses:  Cocaine abuse ([place])  MDD (major depressive disorder), recurrent severe, without psychosis ([place])    HPI: Regina Daniel is a 51y/o female with a self-reported history of bipolar, anxiety, depression, and substance abuse (crack cocaine).  Patient presented voluntarily to [facility] for a walk-in assessment.  Patient presented requesting assistance with substance abuse treatment.  Patient was seen face-to-face and her chart was reviewed by this provider.  On assessment, patient is alert and oriented x4; patient is calm and cooperative.  She is speaking in a normal tone of voice at moderate rate with good eye contact.  Patient's mood is depressed with congruent affect.  Her thought process is coherent and relevant.  Patient did not appear to be responding to internal or external stimuli or experiencing delusional thought content during this assessment.  Patient reports that she is scheduled for an upcoming surgery to repair a right inguinal hernia.  Patient reports that her surgeon is unable to schedule her surgery date due to positive drug test.  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 She says that the hernia is causing her a lot of pain and she is unable to work due to the pain.  Patient reports that her anticipated surgery date is 12/26/21. patient reports that she has been abusing cocaine/crack cocaine for over 25 years.  She states she has never participated in a rehab program and her longest period of sobriety was for 1 year while she was incarcerated.  Patient also reports that she is depressed due to strained relationship with various family members including her eldest son. She endorses depressive symptoms of  hopelessness, isolation, worthlessness, fatigue, poor sleep and poor appetite.   Patient denies suicidal ideation.  She denies past history of suicidal attempt.  Patient denies homicidal ideation, paranoia, and hallucination. Patient reports that she last used crack cocaine 2 hours prior to this assessment on 12/10/21.  PHQ 2-9:   [place] Admission (Discharged) from 11/30/2021 in [place] Testing 45 from 11/01/2021 in [place] ED from 10/12/2021 in [place] No Risk No Risk No Risk        Total Time spent with patient: 15 minutes  Musculoskeletal  Strength & Muscle Tone: within normal limits Gait & Station: normal Patient leans: Right  Psychiatric Specialty Exam  Presentation General Appearance: Appropriate for Environment  Eye Contact:Good  Speech:Clear and Coherent  Speech Volume:Normal  Handedness:Right   Mood and Affect  Mood:Depressed  Affect:Congruent   Thought Process  Thought Processes:Coherent  Descriptions of Associations:Intact  Orientation:Full (Time, Place and Person)  Thought Content:WDL  Diagnosis of Schizophrenia or Schizoaffective disorder in past: No   Hallucinations:Hallucinations: None  Ideas of Reference:None  Suicidal Thoughts:Suicidal Thoughts: No  Homicidal Thoughts:Homicidal Thoughts: No   Sensorium  Memory:Immediate Good; Remote Good; Recent Good  Judgment:Good  Insight:Good   Executive Functions  Concentration:Good  Attention Span:Good  [place] of Knowledge:Good  Language:Good   Psychomotor Activity  Psychomotor Activity:Psychomotor Activity: Normal   Assets  Assets:Communication Skills; Desire for Improvement   Sleep  Sleep:No data recorded  Nutritional Assessment (For OBS and [facility]BC  admissions only) Has the patient had a weight  loss or gain of 10 pounds or more in the last 3 months?: No Has the patient had a decrease in food intake/or appetite?: No Does the patient have dental problems?: No Does the patient have eating habits or behaviors that may be indicators of an eating disorder including binging or inducing vomiting?: No Has the patient recently lost weight without trying?: 0 Has the patient been eating poorly because of a decreased appetite?: 0 Malnutrition Screening Tool Score: 0    Physical Exam Vitals and nursing note reviewed.  Constitutional:      General: She is not in acute distress.    Appearance: She is well-developed.  HENT:     Head: Normocephalic and atraumatic.  Eyes:     Conjunctiva/sclera: Conjunctivae normal.  Cardiovascular:     Rate and Rhythm: Normal rate.  Pulmonary:     Effort: Pulmonary effort is normal. No respiratory distress.     Breath sounds: Normal breath sounds.  Abdominal:     Palpations: Abdomen is soft.     Tenderness: There is no abdominal tenderness.  Musculoskeletal:        General: No swelling.     Cervical back: Neck supple.  Skin:    General: Skin is warm and dry.     Capillary Refill: Capillary refill takes less than 2 seconds.  Neurological:     Mental Status: She is alert and oriented to person, place, and time.  Psychiatric:        Attention and Perception: Attention and perception normal.        Mood and Affect: Mood is anxious and depressed.        Speech: Speech normal.        Behavior: Behavior normal. Behavior is cooperative.        Thought Content: Thought content normal.        Cognition and Memory: Cognition normal.   Review of Systems  Constitutional: Negative.   HENT: Negative.    Eyes: Negative.   Respiratory: Negative.    Cardiovascular: Negative.   Gastrointestinal: Negative.   Genitourinary: Negative.   Musculoskeletal: Negative.   Skin: Negative.   Neurological: Negative.   Endo/Heme/Allergies: Negative.    Psychiatric/Behavioral:  Positive for depression and substance abuse. The patient is nervous/anxious.    Blood pressure 112/68, pulse 71, temperature 98.2 F (36.8 C), temperature source Oral, resp. rate 16, last menstrual period 11/04/2010, SpO2 98 %. There is no height or weight on file to calculate BMI.  Past Psychiatric History:    Is the patient at risk to self? No  Has the patient been a risk to self in the past 6 months? No .    Has the patient been a risk to self within the distant past? No   Is the patient a risk to others? No   Has the patient been a risk to others in the past 6 months? No   Has the patient been a risk to others within the distant past? 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  Social History:  Social History   Socioeconomic History   Marital status: Divorced    Spouse name: Not on file   Number of children: Not on file   Years of education: Not on file   Highest education level: Not on file  Occupational History   Not on file  Tobacco Use   Smoking status: Every Day    Packs/day: 0.50    Years: 32.00    Pack years: 16.00    Types: Cigarettes   Smokeless tobacco: Never   Tobacco comments:    USING E CIGARETTE NOW  Vaping Use   Vaping Use: Former   Substance and Sexual Activity   Alcohol use: No   Drug use: No    Types: Cocaine    Comment: Current every other day use.   Sexual activity: Not on file  Other Topics Concern   Not on file  Social History Narrative   Not on file   Social Determinants of Health   Financial Resource Strain: Not on file  Food Insecurity: Not on file  Transportation Needs: Not on file  Physical Activity: Not on file  Stress: Not on file  Social Connections: Not on file  Intimate Partner Violence: Not on file    SDOH:  SDOH Screenings   Alcohol Screen: Not on file  Depression (PHQ2-9): Not on file  Financial Resource Strain: Not on file  Food Insecurity: Not on file  Housing: Not on file  Physical Activity: Not on file  Social Connections: Not on file  Stress: Not on file  Tobacco Use: High Risk   Smoking Tobacco Use: Every Day   Smokeless Tobacco Use: Never   Passive Exposure: Not on file  Transportation Needs: Not on file    Last Labs:  Admission on 11/30/2021, Discharged on 11/30/2021  Component Date Value Ref Range Status   Tricyclic, Ur Screen [id number] NONE DETECTED  NONE DETECTED Final   Amphetamines, Ur Screen 11/30/2021 NONE DETECTED  NONE DETECTED Final   MDMA (Ecstasy)Ur Screen 11/30/2021 NONE DETECTED  NONE DETECTED Final   Cocaine Metabolite,Ur [place] 11/30/2021 POSITIVE (A)  NONE DETECTED Final   Opiate, Ur Screen 11/30/2021 NONE DETECTED  NONE DETECTED Final   Phencyclidine (PCP) Ur S 11/30/2021 NONE DETECTED  NONE DETECTED Final   Cannabinoid 50 Ng, Ur [place] 11/30/2021 NONE DETECTED  NONE DETECTED Final   Barbiturates, Ur Screen 11/30/2021 NONE DETECTED  NONE DETECTED Final   Benzodiazepine, Ur Scrn 11/30/2021 NONE DETECTED  NONE DETECTED Final   Methadone Scn, Ur 11/30/2021 NONE DETECTED  NONE DETECTED Final   Comment: (NOTE) Tricyclics + metabolites, urine    Cutoff 1000 ng/mL Amphetamines + metabolites, urine  Cutoff 1000 ng/mL MDMA (Ecstasy), urine               Cutoff 500 ng/mL Cocaine Metabolite, urine          Cutoff 300 ng/mL Opiate + metabolites, urine        Cutoff 300 ng/mL Phencyclidine (PCP), urine         Cutoff 25 ng/mL  Cannabinoid, urine                 Cutoff 50 ng/mL Barbiturates + metabolites, urine  Cutoff 200 ng/mL Benzodiazepine, urine              Cutoff 200 ng/mL Methadone, urine                   Cutoff 300 ng/mL  The urine drug screen provides only a preliminary, unconfirmed analytical test result and should not be used for non-medical purposes. Clinical consideration and professional judgment should be applied to any positive drug screen result due to possible interfering substances. A more specific alternate chemical method must be used in order to obtain a confirmed analytical result. Gas chromatography / mass spectrometry (GC/MS) is the preferred confirm                          atory method. Performed at [facility], [address] [place], [place] [address]   Admission on 10/12/2021, Discharged on 10/13/2021  Component Date Value Ref Range Status   Sodium 10/12/2021 141  135 - 145 mmol/L Final   Potassium 10/12/2021 3.4 (L)  3.5 - 5.1 mmol/L Final   Chloride 10/12/2021 107  98 - 111 mmol/L Final   CO2 10/12/2021 28  22 - 32 mmol/L Final   Glucose, Bld 10/12/2021 90  70 - 99 mg/dL Final   Glucose reference range applies only to samples taken after fasting for at least 8 hours.   BUN 10/12/2021 6  6 - 20 mg/dL Final   Creatinine, Ser 10/12/2021 1.03 (H)  0.44 - 1.00 mg/dL Final   Calcium 82/70/7867 8.8 (L)  8.9 - 10.3 mg/dL Final   Total Protein 54/49/2010 5.3 (L)  6.5 - 8.1 g/dL Final   Albumin 05/15/1974 3.4 (L)  3.5 - 5.0 g/dL Final   AST 88/32/5498 18  15 - 41 U/L Final   ALT 10/12/2021 13  0 - 44 U/L Final   Alkaline Phosphatase 10/12/2021 78  38 - 126 U/L Final   Total Bilirubin 10/12/2021 0.3  0.3 - 1.2 mg/dL Final   GFR, Estimated 10/12/2021 >60  >60 mL/min Final   Comment:  (NOTE) Calculated using the CKD-EPI Creatinine Equation (2021)    Anion gap 10/12/2021 6  5 - 15 Final   Performed at [facility], [address], [place], [place] [address]   Color, Urine 10/12/2021 STRAW (A)  YELLOW Final   APPearance 10/12/2021 CLEAR  CLEAR Final   Specific Gravity, Urine 10/12/2021 1.004 (L)  1.005 - 1.030 Final   pH 10/12/2021 6.0  5.0 - 8.0 Final   Glucose, UA 10/12/2021 NEGATIVE  NEGATIVE mg/dL Final   Hgb urine dipstick 10/12/2021 NEGATIVE  NEGATIVE Final   Bilirubin Urine 10/12/2021 NEGATIVE  NEGATIVE Final   Ketones, ur 10/12/2021 NEGATIVE  NEGATIVE mg/dL Final   Protein, ur 83/07/4075 NEGATIVE  NEGATIVE mg/dL Final   Nitrite 80/88/1103 NEGATIVE  NEGATIVE Final   Leukocytes,Ua 10/12/2021 NEGATIVE  NEGATIVE Final   Performed at [facility], [address], [place], [place] [address]   WBC 10/12/2021 5.5  4.0 - 10.5 K/uL Final   RBC 10/12/2021 3.81 (L)  3.87 - 5.11 MIL/uL Final   Hemoglobin 10/12/2021 12.2  12.0 - 15.0 g/dL Final   HCT 85/92/9244 37.8  36.0 - 46.0 % Final   MCV 10/12/2021 99.2  80.0 - 100.0 fL Final  MCH 10/12/2021 32.0  26.0 - 34.0 pg Final   MCHC 10/12/2021 32.3  30.0 - 36.0 g/dL Final   RDW 92/33/0076 12.5  11.5 - 15.5 % Final   Platelets 10/12/2021 187  150 - 400 K/uL Final   nRBC 10/12/2021 0.0  0.0 - 0.2 % Final   Neutrophils Relative % 10/12/2021 58  % Final   Neutro Abs 10/12/2021 3.2  1.7 - 7.7 K/uL Final   Lymphocytes Relative 10/12/2021 28  % Final   Lymphs Abs 10/12/2021 1.5  0.7 - 4.0 K/uL Final   Monocytes Relative 10/12/2021 7  % Final   Monocytes Absolute 10/12/2021 0.4  0.1 - 1.0 K/uL Final   Eosinophils Relative 10/12/2021 6  % Final   Eosinophils Absolute 10/12/2021 0.4  0.0 - 0.5 K/uL Final   Basophils Relative 10/12/2021 1  % Final   Basophils Absolute 10/12/2021 0.1  0.0 - 0.1 K/uL Final   Immature Granulocytes 10/12/2021 0  % Final   Abs Immature Granulocytes 10/12/2021 0.01  0.00 - 0.07 K/uL Final    Performed at [facility], [address] [place], [place] [address]  Admission on 08/09/2021, Discharged on 08/10/2021  Component Date Value Ref Range Status   SARS Coronavirus 2 by RT PCR 08/09/2021 NEGATIVE  NEGATIVE Final   Comment: (NOTE) SARS-CoV-2 target nucleic acids are NOT DETECTED.  The SARS-CoV-2 RNA is generally detectable in upper respiratory specimens during the acute phase of infection. The lowest concentration of SARS-CoV-2 viral copies this assay can detect is 138 copies/mL. A negative result does not preclude SARS-Cov-2 infection and should not be used as the sole basis for treatment or other patient management decisions. A negative result may occur with  improper specimen collection/handling, submission of specimen other than nasopharyngeal swab, presence of viral mutation(s) within the areas targeted by this assay, and inadequate number of viral copies(<138 copies/mL). A negative result must be combined with clinical observations, patient history, and epidemiological information. The expected result is Negative.  Fact Sheet for Patients:  BloggerCourse.com  Fact Sheet for Healthcare Providers:  SeriousBroker.it  This test is no                          t yet approved or cleared by the [place] FDA and  has been authorized for detection and/or diagnosis of SARS-CoV-2 by FDA under an Emergency Use Authorization (EUA). This EUA will remain  in effect (meaning this test can be used) for the duration of the COVID-19 declaration under Section 564(b)(1) of the Act, 21 U.S.C.section 360bbb-3(b)(1), unless the authorization is terminated  or revoked sooner.       Influenza A by PCR 08/09/2021 NEGATIVE  NEGATIVE Final   Influenza B by PCR 08/09/2021 NEGATIVE  NEGATIVE Final   Comment: (NOTE) The Xpert Xpress SARS-CoV-2/FLU/RSV plus assay is intended as an aid in the diagnosis of influenza from  Nasopharyngeal swab specimens and should not be used as a sole basis for treatment. Nasal washings and aspirates are unacceptable for Xpert Xpress SARS-CoV-2/FLU/RSV testing.  Fact Sheet for Patients: BloggerCourse.com  Fact Sheet for Healthcare Providers: SeriousBroker.it  This test is not yet approved or cleared by the [place] FDA and has been authorized for detection and/or diagnosis of SARS-CoV-2 by FDA under an Emergency Use Authorization (EUA). This EUA will remain in effect (meaning this test can be used) for the duration of the COVID-19 declaration under Section 564(b)(1) of the Act, 21  U.S.C. section 360bbb-3(b)(1), unless the authorization is terminated or revoked.  Performed at [facility], [place] [address], [place], [place] [address]    WBC 08/09/2021 7.1  4.0 - 10.5 K/uL Final   RBC 08/09/2021 4.29  3.87 - 5.11 MIL/uL Final   Hemoglobin 08/09/2021 13.5  12.0 - 15.0 g/dL Final   HCT 08/09/2021 39.2  36.0 - 46.0 % Final   MCV 08/09/2021 91.4  80.0 - 100.0 fL Final   MCH 08/09/2021 31.5  26.0 - 34.0 pg Final   MCHC 08/09/2021 34.4  30.0 - 36.0 g/dL Final   RDW 08/09/2021 12.4  11.5 - 15.5 % Final   Platelets 08/09/2021 224  150 - 400 K/uL Final   nRBC 08/09/2021 0.0  0.0 - 0.2 % Final   Neutrophils Relative % 08/09/2021 59  % Final   Neutro Abs 08/09/2021 4.2  1.7 - 7.7 K/uL Final   Lymphocytes Relative 08/09/2021 28  % Final   Lymphs Abs 08/09/2021 2.0  0.7 - 4.0 K/uL Final   Monocytes Relative 08/09/2021 9  % Final   Monocytes Absolute 08/09/2021 0.6  0.1 - 1.0 K/uL Final   Eosinophils Relative 08/09/2021 3  % Final   Eosinophils Absolute 08/09/2021 0.2  0.0 - 0.5 K/uL Final   Basophils Relative 08/09/2021 1  % Final   Basophils Absolute 08/09/2021 0.0  0.0 - 0.1 K/uL Final   Immature Granulocytes 08/09/2021 0  % Final   Abs Immature Granulocytes 08/09/2021 0.03  0.00 - 0.07 K/uL Final   Performed at  [facility], [place] [address] [place], [place] [address]   Hgb A1c MFr Bld 08/09/2021 5.4  4.8 - 5.6 % Final   Comment: (NOTE) Pre diabetes:          5.7%-6.4%  Diabetes:              >6.4%  Glycemic control for   <7.0% adults with diabetes    Mean Plasma Glucose 08/09/2021 108.28  mg/dL Final   Performed at [facility], [place] [address], [place], [place] [address]   TSH 08/09/2021 0.929  0.350 - 4.500 uIU/mL Final   Comment: Performed by a 3rd Generation assay with a functional sensitivity of <=0.01 uIU/mL. Performed at [facility], [place] [address] [place], [place] [address]    Prolactin 08/09/2021 9.5  4.8 - 23.3 ng/mL Final   Comment: (NOTE) Performed At: [facility] [place], [place] [id number] Rush Farmer MD [id number]    POC Amphetamine UR 08/09/2021 None Detected  NONE DETECTED (Cut Off Level 1000 ng/mL) Final   POC Secobarbital (BAR) 08/09/2021 None Detected  NONE DETECTED (Cut Off Level 300 ng/mL) Final   POC Buprenorphine (BUP) 08/09/2021 None Detected  NONE DETECTED (Cut Off Level 10 ng/mL) Final   POC Oxazepam (BZO) 08/09/2021 None Detected  NONE DETECTED (Cut Off Level 300 ng/mL) Final   POC Cocaine UR 08/09/2021 Positive (A)  NONE DETECTED (Cut Off Level 300 ng/mL) Final   POC Methamphetamine UR 08/09/2021 None Detected  NONE DETECTED (Cut Off Level 1000 ng/mL) Final   POC Morphine 08/09/2021 None Detected  NONE DETECTED (Cut Off Level 300 ng/mL) Final   POC Oxycodone UR 08/09/2021 None Detected  NONE DETECTED (Cut Off Level 100 ng/mL) Final   POC Methadone UR 08/09/2021 None Detected  NONE DETECTED (Cut Off Level 300 ng/mL) Final   POC Marijuana UR 08/09/2021 None Detected  NONE DETECTED (Cut Off Level 50  ng/mL) Final   Preg Test, Ur 08/09/2021 NEGATIVE  NEGATIVE Final   Comment:        THE SENSITIVITY OF THIS METHODOLOGY IS >24 mIU/mL     Allergies: Patient has no known allergies.  PTA Medications: (Not in a  hospital admission)   Medical Decision Making  Patient will be admitted to [facility] for substance abuse treatment.     Recommendations  Based on my evaluation the patient does not appear to have an emergency medical condition.  Ophelia Shoulder, NP 12/10/21  5:47 AM

## 2021-12-10 NOTE — Progress Notes (Signed)
Nicotine patch given  placed on right arm.

## 2021-12-10 NOTE — Progress Notes (Signed)
Patient was awake for lunch and then went back to bed and rested.  She is now awake and calm.  No withdrawal symptoms noted.  Mood is sad, affect constricted thought process organized and logical.

## 2021-12-10 NOTE — Progress Notes (Signed)
Patient resting with eyes closed, respirations even and unlabored. No objective signs distress. Staff will continue to monitor.

## 2021-12-11 DIAGNOSIS — F1410 Cocaine abuse, uncomplicated: Secondary | ICD-10-CM | POA: Diagnosis not present

## 2021-12-11 MED ORDER — SERTRALINE HCL 25 MG PO TABS: 25.0000 mg | ORAL_TABLET | Freq: Every day | ORAL | 0 refills | Status: DC

## 2021-12-11 MED ORDER — SERTRALINE HCL 25 MG PO TABS
25.0000 mg | ORAL_TABLET | Freq: Every day | ORAL | 0 refills | Status: DC
Start: 2021-12-12 — End: 2021-12-11

## 2021-12-11 NOTE — Discharge Instructions (Signed)

## 2021-12-11 NOTE — ED Provider Notes (Signed)
FBC/OBS ASAP Discharge Summary  Date and Time: 12/11/2021 11:33 PM  Name: Regina Daniel  MRN:  [id number]   Discharge Diagnoses:  Final diagnoses:  Cocaine abuse ([place])  MDD (major depressive disorder), recurrent severe, without psychosis ([place])    Subjective:  Regina Daniel, 51 y.o., female patient presented to [facility] on 2/5 requesting treatment for cocaine addiction and depressed symptoms.  She was admitted to the facility based crisis unit.  Patient seen face to face by this provider, consulted with Dr. Dwyane Dee; and  chart reviewed on 12/12/21.   On today's reevaluation Regina Daniel is in sitting position in her bed.  She is alert/oriented x4 calm and cooperative.  She is speaking in clear time at a moderate volume, moderate and normal pace with good eye contact.  She continues to endorse depression but states, "I believe that medicine the doctor gave me is actually helping".  Reports her symptoms have improved.  She denies any concerns with appetite or sleep.  She continues to deny SI/HI/AVH.  She does not appear to be responding to internal/external stimuli.  She is logical and able to answer questions appropriately.   Stay Summary:   Patient has remained calm and cooperative while on the unit.  She has interacted with staff and other patients appropriately.  She has exhibited no unsafe behaviors while on the unit.  States she continues to look forward to her hernia repair surgery.  Reports it is scheduled for February 22.  She is requesting to be discharged.  States, "I think I feel good enough to go home".  She will continue to reside with her ex-boyfriend.  Counseled patient on substance use and the importance of not using.  Patient states she feels confident that she will be able to stay "clean".  States, "I have to stay clean or I am not going to get the surgery".  Patient will be discharged.  She will be provided a prescription for Zoloft 25 mg 30-day supply.  Outpatient psychiatric  resources were provided on AVS.  Total Time spent with patient: 20 minutes  Past Psychiatric History: See H&P Past Medical History:  Past Medical History:  Diagnosis Date   Anemia    Anxiety    situational   Drug abuse ([place])    Drug abuse in remission ([place])    Left wrist fracture 11/10/2015   Pneumonia    Ruptured extensor tendon of hand or wrist    Tobacco dependence     Past Surgical History:  Procedure Laterality Date   CHOLECYSTECTOMY     NASAL SEPTUM SURGERY  MARCH 2017   ORIF WRIST FRACTURE Left 03/29/2016   Procedure: LEFT WRIST OSTEOTOMY WITH OPEN REDUCTION INTERNAL FIXATION (ORIF) ;  Surgeon: Mcarthur Rossetti, MD;  Location: [facility];  Service: Orthopedics;  Laterality: Left;  Left Intra-clavicular block   TUBAL LIGATION     WRIST SURGERY Right YEARS AGO   TENDON REPAIR   Family History: No family history on file. Family Psychiatric History: See H&P Social History:  Social History   Substance and Sexual Activity  Alcohol Use No     Social History   Substance and Sexual Activity  Drug Use No   Types: Cocaine   Comment: Current every other day use.    Social History   Socioeconomic History   Marital status: Divorced    Spouse name: Not on file   Number of children: Not on file   Years of education: Not  on file   Highest education level: Not on file  Occupational History   Not on file  Tobacco Use   Smoking status: Every Day    Packs/day: 0.50    Years: 32.00    Pack years: 16.00    Types: Cigarettes   Smokeless tobacco: Never   Tobacco comments:    USING E CIGARETTE NOW  Vaping Use   Vaping Use: Former  Substance and Sexual Activity   Alcohol use: No   Drug use: No    Types: Cocaine    Comment: Current every other day use.   Sexual activity: Not on file  Other Topics Concern   Not on file  Social History Narrative   Not on file   Social Determinants of Health   Financial Resource Strain: Not on file  Food Insecurity: Not on file   Transportation Needs: Not on file  Physical Activity: Not on file  Stress: Not on file  Social Connections: Not on file   SDOH:  SDOH Screenings   Alcohol Screen: Not on file  Depression (PHQ2-9): Not on file  Financial Resource Strain: Not on file  Food Insecurity: Not on file  Housing: Not on file  Physical Activity: Not on file  Social Connections: Not on file  Stress: Not on file  Tobacco Use: High Risk   Smoking Tobacco Use: Every Day   Smokeless Tobacco Use: Never   Passive Exposure: Not on file  Transportation Needs: Not on file    Tobacco Cessation:  A prescription for an FDA-approved tobacco cessation medication was offered at discharge and the patient refused  Current Medications:  No current facility-administered medications for this encounter.   Current Outpatient Medications  Medication Sig Dispense Refill   [START ON 12/12/2021] sertraline (ZOLOFT) 25 MG tablet Take 1 tablet (25 mg total) by mouth daily. 30 tablet 0    PTA Medications: (Not in a hospital admission)   Musculoskeletal  Strength & Muscle Tone: within normal limits Gait & Station: normal Patient leans: N/A  Psychiatric Specialty Exam  Presentation  General Appearance: Appropriate for Environment; Casual  Eye Contact:Good  Speech:Clear and Coherent; Normal Rate  Speech Volume:Normal  Handedness:Right   Mood and Affect  Mood:Depressed  Affect:Congruent   Thought Process  Thought Processes:Coherent  Descriptions of Associations:Intact  Orientation:Full (Time, Place and Person)  Thought Content:Logical  Diagnosis of Schizophrenia or Schizoaffective disorder in past: No    Hallucinations:Hallucinations: None  Ideas of Reference:None  Suicidal Thoughts:Suicidal Thoughts: No  Homicidal Thoughts:Homicidal Thoughts: No   Sensorium  Memory:Immediate Good; Recent Good; Remote Good  Judgment:Good  Insight:Good   Executive Functions  Concentration:Good  Attention  Span:Good  Cricket of Knowledge:Good  Language:Good   Psychomotor Activity  Psychomotor Activity:Psychomotor Activity: Normal   Assets  Assets:Communication Skills; Desire for Improvement; Financial Resources/Insurance; Housing; Physical Health; Resilience; Social Support   Sleep  Sleep:Sleep: Good   Nutritional Assessment (For OBS and FBC admissions only) Has the patient had a weight loss or gain of 10 pounds or more in the last 3 months?: No Has the patient had a decrease in food intake/or appetite?: No Does the patient have dental problems?: No Does the patient have eating habits or behaviors that may be indicators of an eating disorder including binging or inducing vomiting?: No Has the patient recently lost weight without trying?: 0 Has the patient been eating poorly because of a decreased appetite?: 0 Malnutrition Screening Tool Score: 0    Physical Exam  Physical  Exam Vitals and nursing note reviewed.  Constitutional:      General: She is not in acute distress.    Appearance: Normal appearance. She is not ill-appearing.  HENT:     Head: Normocephalic.  Eyes:     General:        Right eye: No discharge.        Left eye: No discharge.     Conjunctiva/sclera: Conjunctivae normal.  Cardiovascular:     Rate and Rhythm: Normal rate.  Pulmonary:     Effort: Pulmonary effort is normal.  Musculoskeletal:        General: Normal range of motion.     Cervical back: Normal range of motion and neck supple.  Skin:    Coloration: Skin is not jaundiced or pale.  Neurological:     Mental Status: She is alert and oriented to person, place, and time.  Psychiatric:        Attention and Perception: Attention and perception normal.        Mood and Affect: Mood and affect normal.        Speech: Speech normal.        Behavior: Behavior is cooperative.        Thought Content: Thought content normal.        Cognition and Memory: Cognition normal.        Judgment:  Judgment is impulsive.   Review of Systems  Constitutional: Negative.   HENT: Negative.    Eyes: Negative.   Respiratory: Negative.    Cardiovascular: Negative.   Genitourinary: Negative.   Musculoskeletal: Negative.   Skin: Negative.   Neurological: Negative.   Psychiatric/Behavioral: Negative.    Blood pressure 101/62, pulse (!) 52, temperature 98.9 F (37.2 C), temperature source Oral, resp. rate 16, last menstrual period 11/04/2010, SpO2 99 %. There is no height or weight on file to calculate BMI.  Demographic Factors:  Caucasian, Low socioeconomic status, and Unemployed  Loss Factors: Financial problems/change in socioeconomic status  Historical Factors: Impulsivity  Risk Reduction Factors:   Positive therapeutic relationship and Positive coping skills or problem solving skills  Continued Clinical Symptoms:  Severe Anxiety and/or Agitation Depression:   Comorbid alcohol abuse/dependence Impulsivity Alcohol/Substance Abuse/Dependencies  Cognitive Features That Contribute To Risk:  None    Suicide Risk:  Minimal: No identifiable suicidal ideation.  Patients presenting with no risk factors but with morbid ruminations; may be classified as minimal risk based on the severity of the depressive symptoms  Plan Of Care/Follow-up recommendations:  Activity:  as tolerated  Diet:  regular   Disposition:  Discharge patient  Prescription for Zoloft 25 mg 30-day supply provided.  Outpatient psychiatric resources for substance abuse treatment medication management and therapy provided  No evidence of imminent risk to self or others at present.    Patient does not meet criteria for psychiatric inpatient admission. Discussed crisis plan, support from social network, calling 911, coming to the Emergency Department, and calling Suicide Hotline.  Revonda Humphrey, NP 12/11/2021, 11:33 PM

## 2021-12-11 NOTE — ED Notes (Signed)
Pt is in the bed sleeping. Respirations are even and unlabored. No acute distress noted. Will continue to monitor for safety. 

## 2021-12-11 NOTE — ED Notes (Signed)
Pt in room asleep.  Breathing is unlabored and even. Will continue to monitor for safety.  ?

## 2021-12-11 NOTE — ED Notes (Signed)
Patient refused to go to group said she is only here to detox, so she can have her surgery and nothing else she refused any paperwork that had anything to do with group, said she does not plan to attend any meetings. Explained that group is an important part of her recovery, but she just walked away saying she is going to stay in her room.

## 2021-12-11 NOTE — ED Notes (Signed)
Pt stated, "I'm not going to any groups. I'm not interested in any of that. I'm just here to detox so I can be ready for my surgery. Sorry". Encouragement provided. Pt returned to her room after eating breakfast and pulled door closed. Will continue to monitor for safety.

## 2021-12-11 NOTE — Clinical Social Work Psych Note (Addendum)
LCSW Update/Discharge Note  Bryer reports feeling "much better" today. She reports she believes the medications are "working because Im not as cranky".   Vienne expressed concerns regarding alternative living arrangements following her discharge from the [facility]. She continues to deny participating in any residential treatment options at this time due to her medical issues.   Ambar has also declined to participate in the programming on the [facility] such as group therapy during her stay.   Massiel shared that she had been in contact with her boyfriend and her sister  and discussed her circustances with them. Smera shared that she cannot stay with her sister temporarily, however she spoke with her boyfriend and he was agreeable with her return to his home. Vetra shared that she felt "safe enough" to return. She reports she and her boyfriend "talked".   Lorenza shared that she will have to go back to her previous living arrangements. Raenah requested to be discharged with sample medications, along with her 30-day presciprition.   LCSW ensured Jasmyne that outpatient psychiatric and substance abuse resources will be charted his her discharge paperwork. LCSW will also provide a list of potential residential treatment options.   Patient will be transported home via taxi. Taxi voucher was approved by LCSW.    LCSW will continue to follow until discharge.   Thomes Lolling, NP notified Roanna Banning, LPN notified     Radonna Ricker, MSW, LCSW Clinical Social Worker ([place]) [facility]

## 2021-12-11 NOTE — ED Notes (Signed)
Pt discharged with  AVS.  AVS reviewed prior to discharge.  Pt alert, oriented, and ambulatory.  Safety maintained.  °
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
## 2021-12-17 ENCOUNTER — Other Ambulatory Visit: Payer: 59

## 2021-12-18 ENCOUNTER — Inpatient Hospital Stay: Admission: RE | Admit: 2021-12-18 | Payer: 59 | Source: Ambulatory Visit

## 2021-12-18 ENCOUNTER — Telehealth: Payer: Self-pay | Admitting: Surgery

## 2021-12-18 NOTE — Telephone Encounter (Signed)
Incoming call from patient.  She cancelled her follow up and surgery with Dr Christian Mate due to personal issues going on in her life.  Under no circumstances is surgery to be rescheduled until patient sees the doctor in office first.  Patient will need to take the initiative if she wants to move forward.  This is the forth cancellation with surgery.

## 2021-12-19 ENCOUNTER — Emergency Department (HOSPITAL_COMMUNITY): Payer: 59

## 2021-12-19 ENCOUNTER — Other Ambulatory Visit: Payer: Self-pay

## 2021-12-19 ENCOUNTER — Emergency Department (HOSPITAL_COMMUNITY)
Admission: EM | Admit: 2021-12-19 | Discharge: 2021-12-19 | Disposition: A | Discharge status: Home / Self Care | Payer: 59 | Attending: Emergency Medicine | Admitting: Emergency Medicine

## 2021-12-19 ENCOUNTER — Encounter (HOSPITAL_COMMUNITY): Payer: Self-pay

## 2021-12-19 DIAGNOSIS — S1091XA Abrasion of unspecified part of neck, initial encounter: Secondary | ICD-10-CM | POA: Insufficient documentation

## 2021-12-19 DIAGNOSIS — S0081XA Abrasion of other part of head, initial encounter: Secondary | ICD-10-CM | POA: Diagnosis not present

## 2021-12-19 DIAGNOSIS — R0781 Pleurodynia: Secondary | ICD-10-CM | POA: Diagnosis not present

## 2021-12-19 DIAGNOSIS — S0990XA Unspecified injury of head, initial encounter: Secondary | ICD-10-CM | POA: Diagnosis present

## 2021-12-19 LAB — CBC WITH DIFFERENTIAL/PLATELET
Abs Immature Granulocytes: 0.01 10*3/uL (ref 0.00–0.07)
Basophils Absolute: 0 10*3/uL (ref 0.0–0.1)
Basophils Relative: 1 %
Eosinophils Absolute: 0.2 10*3/uL (ref 0.0–0.5)
Eosinophils Relative: 3 %
HCT: 36.9 % (ref 36.0–46.0)
Hemoglobin: 11.6 g/dL — ABNORMAL LOW (ref 12.0–15.0)
Immature Granulocytes: 0 %
Lymphocytes Relative: 30 %
Lymphs Abs: 1.7 10*3/uL (ref 0.7–4.0)
MCH: 30.9 pg (ref 26.0–34.0)
MCHC: 31.4 g/dL (ref 30.0–36.0)
MCV: 98.4 fL (ref 80.0–100.0)
Monocytes Absolute: 0.5 10*3/uL (ref 0.1–1.0)
Monocytes Relative: 9 %
Neutro Abs: 3.2 10*3/uL (ref 1.7–7.7)
Neutrophils Relative %: 57 %
Platelets: 169 10*3/uL (ref 150–400)
RBC: 3.75 MIL/uL — ABNORMAL LOW (ref 3.87–5.11)
RDW: 12.2 % (ref 11.5–15.5)
WBC: 5.6 10*3/uL (ref 4.0–10.5)
nRBC: 0 % (ref 0.0–0.2)

## 2021-12-19 LAB — BASIC METABOLIC PANEL
Anion gap: 8 (ref 5–15)
BUN: 14 mg/dL (ref 6–20)
CO2: 24 mmol/L (ref 22–32)
Calcium: 8.4 mg/dL — ABNORMAL LOW (ref 8.9–10.3)
Chloride: 110 mmol/L (ref 98–111)
Creatinine, Ser: 0.97 mg/dL (ref 0.44–1.00)
GFR, Estimated: >60 mL/min (ref >60)
Glucose, Bld: 99 mg/dL (ref 70–99)
Potassium: 3.7 mmol/L (ref 3.5–5.1)
Sodium: 142 mmol/L (ref 135–145)

## 2021-12-19 LAB — I-STAT BETA HCG BLOOD, ED (MC, WL, AP ONLY): I-stat hCG, quantitative: 6.7 m[IU]/mL — ABNORMAL HIGH (ref <5)

## 2021-12-19 MED ORDER — SODIUM CHLORIDE 0.9 % IV BOLUS
500.0000 mL | Freq: Once | INTRAVENOUS | Status: AC
  Administered 2021-12-19: 500 mL via INTRAVENOUS

## 2021-12-19 MED ORDER — IOHEXOL 350 MG/ML SOLN
75.0000 mL | Freq: Once | INTRAVENOUS | Status: AC | PRN
  Administered 2021-12-19: 75 mL via INTRAVENOUS

## 2021-12-19 MED ORDER — IBUPROFEN 800 MG PO TABS
800.0000 mg | ORAL_TABLET | Freq: Once | ORAL | Status: AC
  Administered 2021-12-19: 800 mg via ORAL
  Filled 2021-12-19: qty 1
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
## 2021-12-19 NOTE — Discharge Instructions (Addendum)
Return to ED with any new or worsening signs or symptoms such as cough, fever Please use your incentive spirometer at least 10 times an hour Please utilize splinting techniques I have taught you Please treat pain with 800 mg ibuprofen every 6 hours I have attached an informational pamphlet regarding chest wall pain.  Please review at your leisure
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
## 2021-12-20 ENCOUNTER — Ambulatory Visit: Payer: 59 | Admitting: Surgery

## 2021-12-26 ENCOUNTER — Ambulatory Visit: Admit: 2021-12-26 | Payer: 59 | Admitting: Surgery

## 2021-12-26 SURGERY — HERNIORRHAPHY, INGUINAL, ROBOT-ASSISTED, LAPAROSCOPIC
Anesthesia: General | Laterality: Right

## 2022-03-29 ENCOUNTER — Other Ambulatory Visit: Payer: Self-pay

## 2022-03-29 ENCOUNTER — Encounter (HOSPITAL_COMMUNITY): Payer: Self-pay | Admitting: Emergency Medicine

## 2022-03-29 ENCOUNTER — Emergency Department (HOSPITAL_COMMUNITY)
Admission: EM | Admit: 2022-03-29 | Discharge: 2022-03-29 | Disposition: A | Discharge status: Home / Self Care | Payer: No Typology Code available for payment source | Attending: Emergency Medicine | Admitting: Emergency Medicine

## 2022-03-29 ENCOUNTER — Emergency Department (HOSPITAL_COMMUNITY): Payer: No Typology Code available for payment source

## 2022-03-29 DIAGNOSIS — M79672 Pain in left foot: Secondary | ICD-10-CM | POA: Insufficient documentation

## 2022-03-29 MED ORDER — IBUPROFEN 400 MG PO TABS
400.0000 mg | ORAL_TABLET | Freq: Once | ORAL | Status: AC
Start: 2022-03-29 — End: 2022-03-29
  Administered 2022-03-29: 400 mg via ORAL
  Filled 2022-03-29: qty 1

## 2022-03-29 MED ORDER — IBUPROFEN 600 MG PO TABS: 600.0000 mg | ORAL_TABLET | Freq: Four times a day (QID) | ORAL | 0 refills | Status: DC | PRN

## 2022-03-29 NOTE — ED Notes (Signed)
Pt seen ambulating out of ED w/ stable gait. Pt did not wish to wait for her discharge papers.

## 2022-03-29 NOTE — ED Triage Notes (Signed)
Patient here with pain to the L foot for about a week now. Pt denies any trauma to foot, however does endorse bruise to the top. Not a diabetic. Able to walk ambulate but very painful per patient. 8/10 pain. No obvious deformity noted to the foot.

## 2022-03-29 NOTE — ED Provider Notes (Signed)
[facility] EMERGENCY DEPARTMENT Provider Note   CSN: [id number] Arrival date & time: 03/29/22  0932     History  Chief Complaint  Patient presents with   Foot Pain    Regina Daniel is a 51 y.o. female.  The history is provided by the patient and medical records. No language interpreter was used.  Foot Pain   51 year old female significant history of polysubstance abuse including cocaine abuse presenting complaining of left foot pain.  Patient report for the past week she has noticed pain to the dorsum of her left foot.  Pain is sharp and achy worse with palpation and with movement.  No report of any ankle pain or numbness.  She did not recall injuring the foot but unsure if it did happen.  No specific treatment tried.  Patient works as a Secretary/administrator.  Denies fever or numbness.  Home Medications Prior to Admission medications   Medication Sig Start Date End Date Taking? Authorizing Provider  acetaminophen (TYLENOL) 500 MG tablet Take 1,000 mg by mouth every 6 (six) hours as needed for moderate pain.    [provider]  sertraline (ZOLOFT) 25 MG tablet Take 1 tablet (25 mg total) by mouth daily. 12/12/21   Revonda Humphrey, NP      Allergies    Patient has no known allergies.    Review of Systems   Review of Systems  All other systems reviewed and are negative.  Physical Exam Updated Vital Signs BP 100/60 (BP Location: Left Arm)   Pulse 78   Temp 97.9 F (36.6 C) (Oral)   Resp 20   Ht 5\' 2"  (1.575 m)   Wt 48 kg   LMP 11/04/2010   SpO2 100%   BMI 19.35 kg/m  Physical Exam Vitals and nursing note reviewed.  Constitutional:      General: She is not in acute distress.    Appearance: She is well-developed.  HENT:     Head: Atraumatic.  Eyes:     Conjunctiva/sclera: Conjunctivae normal.  Pulmonary:     Effort: Pulmonary effort is normal.  Musculoskeletal:        General: Tenderness (Left foot: Tenderness noted to the dorsum of the foot  with mild edema noted.  Intact pedal pulse, brisk cap refill to all toes, no deformity, no erythema or warmth noted.  Left ankle nontender with full range of motion.) present.     Cervical back: Neck supple.  Skin:    Findings: No rash.  Neurological:     Mental Status: She is alert.  Psychiatric:        Mood and Affect: Mood normal.    ED Results / Procedures / Treatments   Labs (all labs ordered are listed, but only abnormal results are displayed) Labs Reviewed - No data to display  EKG None  Radiology No results found.  Procedures Procedures    Medications Ordered in ED Medications - No data to display  ED Course/ Medical Decision Making/ A&P                           Medical Decision Making Amount and/or Complexity of Data Reviewed Radiology: ordered.   BP 100/60 (BP Location: Left Arm)   Pulse 78   Temp 97.9 F (36.6 C) (Oral)   Resp 20   Ht 5\' 2"  (1.575 m)   Wt 48 kg   LMP 11/04/2010   SpO2 100%   BMI  19.35 kg/m   This is a 51 year old female with history of polysubstance abuse who presents with complaints of left foot pain.  She endorsed pain to the dorsum of her left foot for the past week.  She is unsure if she may have injured it in any way.  She denies any specific treatment tried.  Pain worse with palpation and with ambulation.  She is able to ambulate.  She did not complain of any numbness fever.  On exam, she does have some mild edema noted to the dorsum of her foot and tenderness to palpation but no erythema or warmth to suggest gout or cellulitis.  She has brisk cap refill and intact distal pulses and no evidence of vascular compromise.  X-ray of the left foot obtained independently reviewed interpreted by me and are negative for fracture or dislocation.  Her ankle is nontender with full range of motion.  She able to ambulate and bear weight.  I suspect this is likely MSK pain.  We will apply Ace wrap, RICE therapy discussed, and will give orthopedic  referral.  Patient given ibuprofen in ER with improvement of her symptoms.  I have consider CT scan of the foot to look for occult fracture but my suspicion for fracture is low.  Social determinant of health includes tobacco abuse.        Final Clinical Impression(s) / ED Diagnoses Final diagnoses:  Foot pain, left    Rx / DC Orders ED Discharge Orders          Ordered    ibuprofen (ADVIL) 600 MG tablet  Every 6 hours PRN        03/29/22 1032              Domenic Moras, PA-C 03/29/22 1038    Horton, Alvin Critchley, DO 03/30/22 1706

## 2022-03-29 NOTE — Discharge Instructions (Signed)
Your foot pain may be due to a sprain of the foot.  Use Ace wrap to provide compression and for comfort.  Take ibuprofen as needed for pain.  Keep foot elevated while at rest to help decrease swelling.  You may follow-up with orthopedic doctor for further care.

## 2022-05-29 ENCOUNTER — Other Ambulatory Visit: Payer: Self-pay

## 2022-05-29 ENCOUNTER — Emergency Department (HOSPITAL_COMMUNITY)
Admission: EM | Admit: 2022-05-29 | Discharge: 2022-05-29 | Discharge status: Against Medical Advice | Payer: Commercial Managed Care - HMO | Attending: Emergency Medicine | Admitting: Emergency Medicine

## 2022-05-29 DIAGNOSIS — R1031 Right lower quadrant pain: Secondary | ICD-10-CM | POA: Diagnosis present

## 2022-05-29 DIAGNOSIS — Z5321 Procedure and treatment not carried out due to patient leaving prior to being seen by health care provider: Secondary | ICD-10-CM | POA: Diagnosis not present

## 2022-05-29 DIAGNOSIS — K4090 Unilateral inguinal hernia, without obstruction or gangrene, not specified as recurrent: Secondary | ICD-10-CM | POA: Diagnosis not present

## 2022-05-29 DIAGNOSIS — G8929 Other chronic pain: Secondary | ICD-10-CM | POA: Insufficient documentation

## 2022-05-29 LAB — COMPREHENSIVE METABOLIC PANEL
ALT: 18 U/L (ref 0–44)
AST: 25 U/L (ref 15–41)
Albumin: 3.8 g/dL (ref 3.5–5.0)
Alkaline Phosphatase: 82 U/L (ref 38–126)
Anion gap: 7 (ref 5–15)
BUN: 14 mg/dL (ref 6–20)
CO2: 27 mmol/L (ref 22–32)
Calcium: 9.2 mg/dL (ref 8.9–10.3)
Chloride: 108 mmol/L (ref 98–111)
Creatinine, Ser: 1.01 mg/dL — ABNORMAL HIGH (ref 0.44–1.00)
GFR, Estimated: >60 mL/min (ref >60)
Glucose, Bld: 94 mg/dL (ref 70–99)
Potassium: 3.7 mmol/L (ref 3.5–5.1)
Sodium: 142 mmol/L (ref 135–145)
Total Bilirubin: 0.4 mg/dL (ref 0.3–1.2)
Total Protein: 6.2 g/dL — ABNORMAL LOW (ref 6.5–8.1)

## 2022-05-29 LAB — URINALYSIS, ROUTINE W REFLEX MICROSCOPIC
Bilirubin Urine: NEGATIVE
Glucose, UA: NEGATIVE mg/dL
Hgb urine dipstick: NEGATIVE
Ketones, ur: NEGATIVE mg/dL
Nitrite: NEGATIVE
Protein, ur: NEGATIVE mg/dL
Specific Gravity, Urine: 1.02 (ref 1.005–1.030)
pH: 5 (ref 5.0–8.0)

## 2022-05-29 LAB — CBC WITH DIFFERENTIAL/PLATELET
Abs Immature Granulocytes: 0.03 10*3/uL (ref 0.00–0.07)
Basophils Absolute: 0 10*3/uL (ref 0.0–0.1)
Basophils Relative: 0 %
Eosinophils Absolute: 0.1 10*3/uL (ref 0.0–0.5)
Eosinophils Relative: 2 %
HCT: 42.1 % (ref 36.0–46.0)
Hemoglobin: 13.9 g/dL (ref 12.0–15.0)
Immature Granulocytes: 0 %
Lymphocytes Relative: 17 %
Lymphs Abs: 1.3 10*3/uL (ref 0.7–4.0)
MCH: 31.1 pg (ref 26.0–34.0)
MCHC: 33 g/dL (ref 30.0–36.0)
MCV: 94.2 fL (ref 80.0–100.0)
Monocytes Absolute: 0.5 10*3/uL (ref 0.1–1.0)
Monocytes Relative: 7 %
Neutro Abs: 5.5 10*3/uL (ref 1.7–7.7)
Neutrophils Relative %: 74 %
Platelets: 217 10*3/uL (ref 150–400)
RBC: 4.47 MIL/uL (ref 3.87–5.11)
RDW: 12.3 % (ref 11.5–15.5)
WBC: 7.5 10*3/uL (ref 4.0–10.5)
nRBC: 0 % (ref 0.0–0.2)

## 2022-05-29 LAB — LIPASE, BLOOD: Lipase: 36 U/L (ref 11–51)

## 2022-05-29 LAB — I-STAT BETA HCG BLOOD, ED (MC, WL, AP ONLY): I-stat hCG, quantitative: 5.5 m[IU]/mL — ABNORMAL HIGH (ref <5)

## 2022-05-29 NOTE — ED Provider Triage Note (Signed)
Emergency Medicine Provider Triage Evaluation Note  Regina Daniel , a 51 y.o. female  was evaluated in triage.  Pt complains of abdominal pain, nausea, and vomiting. States that she has had chronic abdominal pain for 2 years but states that the pain is worse today. Has an inguinal hernia on the right side and states she is seeing [facility] for same. States they sent her here for labs and imaging. States that it has been several days since she had a full bowel movement. Endorses hx of cholecystectomy and tubal ligation.  Review of Systems  Positive:  Negative:   Physical Exam  BP 116/74 (BP Location: Left Arm)   Pulse (!) 57   Temp 98.2 F (36.8 C) (Oral)   Resp (!) 22   LMP 11/04/2010   SpO2 100%  Gen:   Awake, no distress   Resp:  Normal effort MSK:   Moves extremities without difficulty  Other:  Easily reducible soft right sided inguinal hernia  Medical Decision Making  Medically screening exam initiated at 9:49 PM.  Appropriate orders placed.  Nela A Baugh was informed that the remainder of the evaluation will be completed by another provider, this initial triage assessment does not replace that evaluation, and the importance of remaining in the ED until their evaluation is complete.     Vear Clock 05/29/22 2152

## 2022-05-29 NOTE — ED Triage Notes (Signed)
Pt c/o worsening RLQ abdominal pain, nausea, and vomiting today. Pt endorses chronic abdominal pain r/t hernia. Was recommended ED visit by [facility]. Denies urinary problems.

## 2022-05-29 NOTE — ED Notes (Signed)
Patient states the wait is too long and is leaving 

## 2022-06-04 ENCOUNTER — Encounter (HOSPITAL_COMMUNITY): Payer: Self-pay | Admitting: Emergency Medicine

## 2022-06-04 ENCOUNTER — Emergency Department (HOSPITAL_COMMUNITY): Payer: Commercial Managed Care - HMO

## 2022-06-04 ENCOUNTER — Emergency Department (HOSPITAL_COMMUNITY)
Admission: EM | Admit: 2022-06-04 | Discharge: 2022-06-04 | Disposition: A | Discharge status: Home / Self Care | Payer: Commercial Managed Care - HMO | Attending: Emergency Medicine | Admitting: Emergency Medicine

## 2022-06-04 ENCOUNTER — Other Ambulatory Visit: Payer: Self-pay

## 2022-06-04 DIAGNOSIS — S61452A Open bite of left hand, initial encounter: Secondary | ICD-10-CM

## 2022-06-04 DIAGNOSIS — W540XXA Bitten by dog, initial encounter: Secondary | ICD-10-CM | POA: Diagnosis not present

## 2022-06-04 DIAGNOSIS — Z23 Encounter for immunization: Secondary | ICD-10-CM | POA: Diagnosis not present

## 2022-06-04 DIAGNOSIS — S61412A Laceration without foreign body of left hand, initial encounter: Secondary | ICD-10-CM | POA: Insufficient documentation

## 2022-06-04 DIAGNOSIS — Z20822 Contact with and (suspected) exposure to covid-19: Secondary | ICD-10-CM | POA: Insufficient documentation

## 2022-06-04 DIAGNOSIS — S6992XA Unspecified injury of left wrist, hand and finger(s), initial encounter: Secondary | ICD-10-CM | POA: Diagnosis present

## 2022-06-04 LAB — CBC WITH DIFFERENTIAL/PLATELET
Abs Immature Granulocytes: 0.06 10*3/uL (ref 0.00–0.07)
Basophils Absolute: 0.1 10*3/uL (ref 0.0–0.1)
Basophils Relative: 1 %
Eosinophils Absolute: 0.1 10*3/uL (ref 0.0–0.5)
Eosinophils Relative: 1 %
HCT: 34.4 % — ABNORMAL LOW (ref 36.0–46.0)
Hemoglobin: 11.6 g/dL — ABNORMAL LOW (ref 12.0–15.0)
Immature Granulocytes: 1 %
Lymphocytes Relative: 18 %
Lymphs Abs: 1.8 10*3/uL (ref 0.7–4.0)
MCH: 31.6 pg (ref 26.0–34.0)
MCHC: 33.7 g/dL (ref 30.0–36.0)
MCV: 93.7 fL (ref 80.0–100.0)
Monocytes Absolute: 0.8 10*3/uL (ref 0.1–1.0)
Monocytes Relative: 7 %
Neutro Abs: 7.3 10*3/uL (ref 1.7–7.7)
Neutrophils Relative %: 72 %
Platelets: 241 10*3/uL (ref 150–400)
RBC: 3.67 MIL/uL — ABNORMAL LOW (ref 3.87–5.11)
RDW: 12.2 % (ref 11.5–15.5)
WBC: 10.1 10*3/uL (ref 4.0–10.5)
nRBC: 0 % (ref 0.0–0.2)

## 2022-06-04 LAB — SARS CORONAVIRUS 2 BY RT PCR: SARS Coronavirus 2 by RT PCR: NEGATIVE

## 2022-06-04 LAB — BASIC METABOLIC PANEL
Anion gap: 9 (ref 5–15)
BUN: 23 mg/dL — ABNORMAL HIGH (ref 6–20)
CO2: 20 mmol/L — ABNORMAL LOW (ref 22–32)
Calcium: 8.9 mg/dL (ref 8.9–10.3)
Chloride: 110 mmol/L (ref 98–111)
Creatinine, Ser: 1.13 mg/dL — ABNORMAL HIGH (ref 0.44–1.00)
GFR, Estimated: 59 mL/min — ABNORMAL LOW (ref >60)
Glucose, Bld: 111 mg/dL — ABNORMAL HIGH (ref 70–99)
Potassium: 3.9 mmol/L (ref 3.5–5.1)
Sodium: 139 mmol/L (ref 135–145)

## 2022-06-04 LAB — PROTIME-INR
INR: 1.1 (ref 0.8–1.2)
Prothrombin Time: 13.6 seconds (ref 11.4–15.2)

## 2022-06-04 MED ORDER — HYDROCODONE-ACETAMINOPHEN 5-325 MG PO TABS: 1.0000 | ORAL_TABLET | Freq: Three times a day (TID) | ORAL | 0 refills | Status: AC | PRN

## 2022-06-04 MED ORDER — SODIUM CHLORIDE 0.9 % IV SOLN: 1000.0000 mL | INTRAVENOUS | Status: DC

## 2022-06-04 MED ORDER — TETANUS-DIPHTH-ACELL PERTUSSIS 5-2.5-18.5 LF-MCG/0.5 IM SUSY
0.5000 mL | PREFILLED_SYRINGE | Freq: Once | INTRAMUSCULAR | Status: AC
Start: 2022-06-04 — End: 2022-06-04
  Administered 2022-06-04: 0.5 mL via INTRAMUSCULAR
  Filled 2022-06-04: qty 0.5

## 2022-06-04 MED ORDER — AMOXICILLIN-POT CLAVULANATE 875-125 MG PO TABS: 1.0000 | ORAL_TABLET | Freq: Two times a day (BID) | ORAL | 0 refills | Status: AC

## 2022-06-04 MED ORDER — LIDOCAINE HCL 2 % IJ SOLN
10.0000 mL | Freq: Once | INTRAMUSCULAR | Status: AC
  Administered 2022-06-04: 200 mg via INTRADERMAL
  Filled 2022-06-04: qty 20

## 2022-06-04 MED ORDER — SODIUM CHLORIDE 0.9 % IV SOLN
3.0000 g | Freq: Once | INTRAVENOUS | Status: AC
  Administered 2022-06-04: 3 g via INTRAVENOUS
  Filled 2022-06-04: qty 8

## 2022-06-04 MED ORDER — SODIUM CHLORIDE 0.9 % IV BOLUS (SEPSIS)
1000.0000 mL | Freq: Once | INTRAVENOUS | Status: AC
  Administered 2022-06-04: 1000 mL via INTRAVENOUS

## 2022-06-04 MED ORDER — FENTANYL CITRATE PF 50 MCG/ML IJ SOSY
50.0000 ug | PREFILLED_SYRINGE | Freq: Once | INTRAMUSCULAR | Status: AC
  Administered 2022-06-04: 50 ug via INTRAVENOUS
  Filled 2022-06-04: qty 1

## 2022-06-04 NOTE — Discharge Instructions (Signed)
Do not let your laceration (cut) get wet for the next 48 hours. After that you may allow soapy water to drain down the wound to clean it. Please do not scrub.  To minimize scarring, you can apply a vaseline based ointment for the next 2 weeks and keep it out of direct sun light. After that, you may apply sunscreen for the next several months. Your stitches will need to be removed in 10-14 days.  Return if your wound appears to be infected (see laceration care instructions).  

## 2022-06-04 NOTE — ED Triage Notes (Addendum)
Pt arrives via [facility] for dog bite to L hand. Per pt she was feeding her friend's dog and went to move the food bowl closer and dog bit her hand, this occurred around 2330. When EMS arrived pt had a grocery bag tied around her wrist to catch the blood - bleeding was uncontrolled at the time, ems estimated 200-354ml blood loss. EMS wrapped hand in gauze, bleeding now controlled. PT reports no movement in L hand, decreased sensation in fingers. Initial BP 74/40, ems gave NS now bo 100/56, 74HR, 24RR. Pt admits to cocaine use tonight.

## 2022-06-04 NOTE — ED Provider Notes (Signed)
[facility] Provider Note  CSN: [id number] Arrival date & time: 06/04/22 0107  Chief Complaint(s) Animal Bite  HPI Regina Daniel is a 51 y.o. female    The history is provided by the patient and the EMS personnel.  Animal Bite Contact animal:  Dog Location:  Hand Hand injury location:  L hand Time since incident:  1 hour Pain details:    Quality:  Numbness and aching   Severity:  Severe   Timing:  Constant Incident location:  Another residence Provoked: provoked   Notifications:  None Animal's rabies vaccination status:  Unknown Animal in possession: yes   Tetanus status:  Up to date Relieved by:  Nothing Worsened by:  Activity Associated symptoms: numbness   Associated symptoms: no swelling     Past Medical History Past Medical History:  Diagnosis Date   Anemia    Anxiety    situational   Drug abuse (HCC)    Drug abuse in remission (HCC)    Left wrist fracture 11/10/2015   Pneumonia    Ruptured extensor tendon of hand or wrist    Tobacco dependence    Patient Active Problem List   Diagnosis Date Noted   Substance abuse (HCC) 12/10/2021   Cocaine abuse (HCC) 12/10/2021   Right inguinal hernia 10/23/2021   MDD (major depressive disorder), recurrent severe, 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 Authorizing Provider  amoxicillin-clavulanate (AUGMENTIN) 875-125 MG tablet Take 1 tablet by mouth every 12 (twelve) hours for 10 days. 06/04/22 06/14/22 Yes Shaquon Gropp, Amadeo GarnetPedro Eduardo, MD  HYDROcodone-acetaminophen (NORCO/VICODIN) 5-325 MG tablet Take 1 tablet by mouth every 8 (eight) hours as needed for up to 5 days for severe pain (That is not improved by your scheduled acetaminophen regimen). Please do not exceed 4000 mg of acetaminophen (Tylenol) a 24-hour period. Please note that he may be prescribed  additional medicine that contains acetaminophen. 06/04/22 06/09/22 Yes Francisca Harbuck, Amadeo GarnetPedro Eduardo, MD  acetaminophen (TYLENOL) 500 MG tablet Take 1,000 mg by mouth every 6 (six) hours as needed for moderate pain.    [provider]  ibuprofen (ADVIL) 600 MG tablet Take 1 tablet (600 mg total) by mouth every 6 (six) hours as needed. 03/29/22   Fayrene Helperran, [place], PA-C  sertraline (ZOLOFT) 25 MG tablet Take 1 tablet (25 mg total) by mouth daily. 12/12/21   Ardis Hughsoleman, Carolyn H, NP                                                                                                                                    Allergies Patient has no known allergies.  Review of Systems Review of Systems  Neurological:  Positive for numbness.   As noted in HPI  Physical Exam Vital Signs  I have reviewed the triage vital signs  BP 94/61 (BP Location: Left Arm)   Pulse 66   Temp 98 F (36.7 C) (Oral)   Resp 18   LMP 11/04/2010   SpO2 98%   Physical Exam Vitals reviewed.  Constitutional:      General: She is not in acute distress.    Appearance: She is well-developed. She is not diaphoretic.  HENT:     Head: Normocephalic and atraumatic.     Right Ear: External ear normal.     Left Ear: External ear normal.     Nose: Nose normal.  Eyes:     General: No scleral icterus.    Conjunctiva/sclera: Conjunctivae normal.  Neck:     Trachea: Phonation normal.  Cardiovascular:     Rate and Rhythm: Normal rate and regular rhythm.  Pulmonary:     Effort: Pulmonary effort is normal. No respiratory distress.     Breath sounds: No stridor.  Abdominal:     General: There is no distension.  Musculoskeletal:        General: Normal range of motion.     Right hand: Normal pulse.     Left hand: Laceration and tenderness present. Normal range of motion. Normal strength. Normal sensation. Normal capillary refill. Normal pulse.       Hands:     Cervical back: Normal range of motion.     Comments: ROM limited by pain, but  able to range at all joints.  Neurological:     Mental Status: She is alert and oriented to person, place, and time.  Psychiatric:        Behavior: Behavior normal.      ED Results and Treatments Labs (all labs ordered are listed, but only abnormal results are displayed) Labs Reviewed  CBC WITH DIFFERENTIAL/PLATELET - Abnormal; Notable for the following components:      Result Value   RBC 3.67 (*)    Hemoglobin 11.6 (*)    HCT 34.4 (*)    All other components within normal limits  BASIC METABOLIC PANEL - Abnormal; Notable for the following components:   CO2 20 (*)    Glucose, Bld 111 (*)    BUN 23 (*)    Creatinine, Ser 1.13 (*)    GFR, Estimated 59 (*)    All other components within normal limits  SARS CORONAVIRUS 2 BY RT PCR  PROTIME-INR                                                                                                                         EKG  EKG Interpretation  Date/Time:    Ventricular Rate:    PR Interval:    QRS Duration:   QT Interval:    QTC Calculation:   R Axis:     Text Interpretation:         Radiology DG Hand Complete Left  Result Date: 06/04/2022 CLINICAL DATA:  Dog bite to the left hand. EXAM: LEFT HAND - COMPLETE 3+ VIEW COMPARISON:  Left hand radiograph dated 05/23/2021. FINDINGS: Evaluation is very limited due to positioning and superimposition of the digits. No definite acute fracture or dislocation. The bones are osteopenic. No significant arthritic changes. There is laceration of the soft tissues of the hand. No radiopaque foreign object. Overlying dressing noted. IMPRESSION: No definite acute fracture or dislocation. Electronically Signed   By: Elgie Collard M.D.   On: 06/04/2022 01:39    Pertinent labs & imaging results that were available during my care of the patient were reviewed by me and considered in my medical decision making (see MDM for details).  Medications Ordered in ED Medications  sodium chloride 0.9 % bolus  1,000 mL (0 mLs Intravenous Stopped 06/04/22 0347)  fentaNYL (SUBLIMAZE) injection 50 mcg (50 mcg Intravenous Given 06/04/22 0149)  Tdap (BOOSTRIX) injection 0.5 mL (0.5 mLs Intramuscular Given 06/04/22 0150)  Ampicillin-Sulbactam (UNASYN) 3 g in sodium chloride 0.9 % 100 mL IVPB (0 g Intravenous Stopped 06/04/22 0347)  fentaNYL (SUBLIMAZE) injection 50 mcg (50 mcg Intravenous Given 06/04/22 0344)  lidocaine (XYLOCAINE) 2 % (with pres) injection 200 mg (200 mg Intradermal Given 06/04/22 0347)                                                                                                                                     Procedures .Marland KitchenLaceration Repair  Date/Time: 06/04/2022 5:27 AM  Performed by: Nira Conn, MD Authorized by: Nira Conn, MD   Consent:    Consent obtained:  Verbal   Consent given by:  Patient   Risks discussed:  Infection, poor cosmetic result and poor wound healing   Alternatives discussed:  No treatment Universal protocol:    Procedure explained and questions answered to patient or proxy's satisfaction: yes     Imaging studies available: yes     Patient identity confirmed:  Verbally with patient Laceration details:    Location:  Hand   Hand location:  L palm   Length (cm):  8   Depth (mm):  10 Pre-procedure details:    Preparation:  Patient was prepped and draped in usual sterile fashion and imaging obtained to evaluate for foreign bodies Exploration:    Limited defect created (wound extended): no     Hemostasis achieved with:  Direct pressure   Imaging obtained: x-ray     Imaging outcome: foreign body not noted     Wound exploration: wound explored through full range of motion and entire depth of wound visualized     Wound extent: no foreign bodies/material noted, no muscle damage noted, no nerve damage noted, no tendon damage noted, no underlying fracture noted and no vascular damage noted     Contaminated: yes   Treatment:    Area cleansed with:   Povidone-iodine   Amount of cleaning:  Extensive   Irrigation solution:  Sterile saline   Irrigation volume:  2000   Irrigation method:  Pressure wash  Debridement:  Minimal Skin repair:    Repair method:  Sutures   Suture size:  4-0   Suture material:  Prolene   Suture technique:  Horizontal mattress and simple interrupted   Number of sutures: 4 HM, 1 SI. Approximation:    Approximation:  Loose Repair type:    Repair type:  Intermediate Post-procedure details:    Dressing:  Non-adherent dressing and bulky dressing   Procedure completion:  Tolerated .Marland KitchenLaceration Repair  Date/Time: 06/04/2022 8:48 AM  Performed by: Fatima Blank, MD Authorized by: Fatima Blank, MD   Laceration details:    Location:  Hand   Hand location:  L hand, dorsum   Length (cm):  1   Depth (mm):  4 Pre-procedure details:    Preparation:  Patient was prepped and draped in usual sterile fashion and imaging obtained to evaluate for foreign bodies Exploration:    Hemostasis achieved with:  Direct pressure   Imaging obtained: x-ray     Imaging outcome: foreign body not noted     Wound extent: no foreign bodies/material noted, no muscle damage noted, no nerve damage noted, no tendon damage noted, no underlying fracture noted and no vascular damage noted     Contaminated: yes   Treatment:    Area cleansed with:  Povidone-iodine   Amount of cleaning:  Extensive   Irrigation volume:  500cc   Irrigation method:  Pressure wash   Debridement:  None   Undermining:  None Skin repair:    Repair method:  Sutures   Suture size:  4-0   Suture material:  Prolene   Suture technique:  Simple interrupted   Number of sutures:  1 Approximation:    Approximation:  Loose Repair type:    Repair type:  Simple Post-procedure details:    Dressing:  Non-adherent dressing   Procedure completion:  Tolerated   (including critical care time)  Medical Decision Making / ED Course    Complexity of  Problem:  Co-morbidities/SDOH that complicate the patient evaluation/care: Noted in HPI  Patient's presenting problem/concern, DDX, and MDM listed below: Lacerations to hand from dog bite Will get labs in case OR required Will get plain film to assess for FB or bone injury  Initial Intervention:  IVF, pain meds, tdap, unasyn    Complexity of Data:    Laboratory Tests ordered listed below with my independent interpretation: CBC without leukocytosis.  Stable hemoglobin. Metabolic panel without significant electrolyte derangements.  Mild renal insufficiency.   Imaging Studies ordered listed below with my independent interpretation: X-ray of the left hand without any acute fractures or dislocations.  No retained foreign body.     ED Course:      Assessment, Add'l Intervention, and Reassessment: Dog bite Multiple hand lacerations Thoroughly irrigated.  2 largest wounds closed as above Tetanus booster updated Provided with prophylactic antibiot Discussed case with Dr. Lorin Mercy from [facility].  No OR needed, they will see in clinic for close follow up.    Final Clinical Impression(s) / ED Diagnoses Final diagnoses:  Dog bite of left hand, initial encounter   The patient appears reasonably screened and/or stabilized for discharge and I doubt any other medical condition or other [facility] requiring further screening, evaluation, or treatment in the ED at this time. I have discussed the findings, Dx and Tx plan with the patient/family who expressed understanding and agree(s) with the plan. Discharge instructions discussed at length. The patient/family was given strict return precautions who verbalized understanding of the  instructions. No further questions at time of discharge.  Disposition: Discharge  Condition: Good  ED Discharge Orders          Ordered    amoxicillin-clavulanate (AUGMENTIN) 875-125 MG tablet  Every 12 hours        06/04/22 0624    HYDROcodone-acetaminophen  (NORCO/VICODIN) 5-325 MG tablet  Every 8 hours PRN        06/04/22 0624            Follow Up: [facility] [place] [id number] [phone] Call  to schedule an appointment for close follow up for hand lacerations           This chart was dictated using voice recognition software.  Despite best efforts to proofread,  errors can occur which can change the documentation meaning.    Fatima Blank, MD 06/04/22 [phone]

## 2022-06-04 NOTE — ED Notes (Signed)
Patient given coke and sandwich.   

## 2022-06-11 ENCOUNTER — Encounter: Payer: Self-pay | Admitting: Physician Assistant

## 2022-06-11 ENCOUNTER — Ambulatory Visit (INDEPENDENT_AMBULATORY_CARE_PROVIDER_SITE_OTHER): Payer: Commercial Managed Care - HMO | Admitting: Physician Assistant

## 2022-06-11 DIAGNOSIS — S61452D Open bite of left hand, subsequent encounter: Secondary | ICD-10-CM

## 2022-06-11 DIAGNOSIS — W540XXD Bitten by dog, subsequent encounter: Secondary | ICD-10-CM | POA: Diagnosis not present

## 2022-06-11 MED ORDER — HYDROCODONE-ACETAMINOPHEN 5-325 MG PO TABS: 1.0000 | ORAL_TABLET | Freq: Four times a day (QID) | ORAL | 0 refills | Status: DC | PRN

## 2022-06-11 NOTE — Progress Notes (Signed)
HPI: Regina Daniel returns today due to left hand laceration due to a dog bite that occurred on 06/04/2022.  She was seen in the ER where the wound was cleaned and in the ER.  She was given antibiotics in the ER and then placed on home antibiotics Augmentin which she is currently on.  She is also taking hydrocodone for pain.  She does not have a history of a left extensor tendon repair 10/26/2021 done in [facility] and she feels that this injury may have caused some damage to the extensor tendon again.  She rates her pain to be 8 out of 10 pain in the hand.  She suffered no other injuries at the time of dog bite.  She does have swelling numbness about the thumb.  She had no fevers or chills.  Radiographs were obtained in the ER with some what suboptimal due to superimposed fingers but no acute fracture is visualized.  Films were personally reviewed by myself.  Review of systems: See HPI otherwise negative  Physical exam: General well-developed well-nourished female no acute distress.  Affect appropriate. Psych: Alert and oriented x 3. Left hand swelling but no significant erythema.  There is no drainage no expressible purulence.  Laceration is well-approximated with interrupted sutures.  Appropriate swelling seen in the thenar aspect of the thumb.  She is able to almost fully extend the left thumb she has limited flexion of the left thumb through the [facility] joint and IP joint.  Sensation grossly intact throughout the hand.  Impression: Left hand dog bite  Plan: Will leave the sutures intact for now.  She will continue her Augmentin.  Will see her back in a week for removal of sutures.  Will reevaluate her thumb extension at that time.  Questions were encouraged and answered at length.  Refill of hydrocodone was given.

## 2022-06-19 ENCOUNTER — Ambulatory Visit (INDEPENDENT_AMBULATORY_CARE_PROVIDER_SITE_OTHER): Payer: Commercial Managed Care - HMO | Admitting: Physician Assistant

## 2022-06-19 ENCOUNTER — Encounter: Payer: Self-pay | Admitting: Physician Assistant

## 2022-06-19 DIAGNOSIS — S61452D Open bite of left hand, subsequent encounter: Secondary | ICD-10-CM

## 2022-06-19 DIAGNOSIS — W540XXD Bitten by dog, subsequent encounter: Secondary | ICD-10-CM | POA: Diagnosis not present

## 2022-06-19 NOTE — Progress Notes (Signed)
HPI: Regina Daniel returns today for follow-up of her left hand wound.  Again she is 2 weeks out from a dog bite to her left hand.  She is concerned about weakness in her hand.  She states the hand feels stiff and she is concerned about her ability to extend her thumb.  She has had no fevers chills.  Physical exam: Left hand surgical incision slight dehiscence but no signs of infection or drainage.  Sutures remain intact.  Subjective decrease sensation throughout the thumb thenar region.  She has full active extension and flexion left thumb.  Impression: Left hand dog bite  Plan: We will keep her sutures intact for now.  She will keep a clean dressing on the wound taking it off for hygiene purposes she is washing with antibacterial soap and dry completely apply new dressing.  We will see her back in 1 week for possible suture removal.

## 2022-06-24 ENCOUNTER — Ambulatory Visit: Payer: No Typology Code available for payment source | Admitting: Physician Assistant

## 2023-01-09 ENCOUNTER — Encounter: Payer: Self-pay | Admitting: Radiology

## 2023-04-10 ENCOUNTER — Emergency Department (HOSPITAL_COMMUNITY): Payer: Commercial Managed Care - HMO

## 2023-04-10 ENCOUNTER — Encounter (HOSPITAL_COMMUNITY): Payer: Self-pay

## 2023-04-10 ENCOUNTER — Emergency Department (HOSPITAL_COMMUNITY)
Admission: EM | Admit: 2023-04-10 | Discharge: 2023-04-10 | Disposition: A | Discharge status: Home / Self Care | Payer: Commercial Managed Care - HMO | Attending: Emergency Medicine | Admitting: Emergency Medicine

## 2023-04-10 ENCOUNTER — Other Ambulatory Visit: Payer: Self-pay

## 2023-04-10 DIAGNOSIS — R112 Nausea with vomiting, unspecified: Secondary | ICD-10-CM | POA: Insufficient documentation

## 2023-04-10 DIAGNOSIS — R1032 Left lower quadrant pain: Secondary | ICD-10-CM | POA: Diagnosis not present

## 2023-04-10 DIAGNOSIS — R7989 Other specified abnormal findings of blood chemistry: Secondary | ICD-10-CM | POA: Diagnosis not present

## 2023-04-10 DIAGNOSIS — R1031 Right lower quadrant pain: Secondary | ICD-10-CM | POA: Insufficient documentation

## 2023-04-10 DIAGNOSIS — R197 Diarrhea, unspecified: Secondary | ICD-10-CM | POA: Insufficient documentation

## 2023-04-10 LAB — COMPREHENSIVE METABOLIC PANEL
ALT: 17 U/L (ref 0–44)
AST: 24 U/L (ref 15–41)
Albumin: 3.4 g/dL — ABNORMAL LOW (ref 3.5–5.0)
Alkaline Phosphatase: 76 U/L (ref 38–126)
Anion gap: 9 (ref 5–15)
BUN: 22 mg/dL — ABNORMAL HIGH (ref 6–20)
CO2: 21 mmol/L — ABNORMAL LOW (ref 22–32)
Calcium: 8.3 mg/dL — ABNORMAL LOW (ref 8.9–10.3)
Chloride: 108 mmol/L (ref 98–111)
Creatinine, Ser: 0.81 mg/dL (ref 0.44–1.00)
GFR, Estimated: >60 mL/min (ref >60)
Glucose, Bld: 105 mg/dL — ABNORMAL HIGH (ref 70–99)
Potassium: 3.9 mmol/L (ref 3.5–5.1)
Sodium: 138 mmol/L (ref 135–145)
Total Bilirubin: 0.6 mg/dL (ref 0.3–1.2)
Total Protein: 5.5 g/dL — ABNORMAL LOW (ref 6.5–8.1)

## 2023-04-10 LAB — RAPID URINE DRUG SCREEN, HOSP PERFORMED
Amphetamines: NOT DETECTED
Barbiturates: NOT DETECTED
Benzodiazepines: NOT DETECTED
Cocaine: POSITIVE — AB
Opiates: NOT DETECTED
Tetrahydrocannabinol: NOT DETECTED

## 2023-04-10 LAB — URINALYSIS, ROUTINE W REFLEX MICROSCOPIC
Bacteria, UA: NONE SEEN
Bilirubin Urine: NEGATIVE
Glucose, UA: NEGATIVE mg/dL
Ketones, ur: NEGATIVE mg/dL
Leukocytes,Ua: NEGATIVE
Nitrite: NEGATIVE
Protein, ur: NEGATIVE mg/dL
Specific Gravity, Urine: 1.02 (ref 1.005–1.030)
pH: 5 (ref 5.0–8.0)

## 2023-04-10 LAB — CBC WITH DIFFERENTIAL/PLATELET
Abs Immature Granulocytes: 0.01 10*3/uL (ref 0.00–0.07)
Basophils Absolute: 0 10*3/uL (ref 0.0–0.1)
Basophils Relative: 0 %
Eosinophils Absolute: 0.1 10*3/uL (ref 0.0–0.5)
Eosinophils Relative: 1 %
HCT: 40.4 % (ref 36.0–46.0)
Hemoglobin: 13.4 g/dL (ref 12.0–15.0)
Immature Granulocytes: 0 %
Lymphocytes Relative: 3 %
Lymphs Abs: 0.2 10*3/uL — ABNORMAL LOW (ref 0.7–4.0)
MCH: 30.9 pg (ref 26.0–34.0)
MCHC: 33.2 g/dL (ref 30.0–36.0)
MCV: 93.3 fL (ref 80.0–100.0)
Monocytes Absolute: 0.2 10*3/uL (ref 0.1–1.0)
Monocytes Relative: 4 %
Neutro Abs: 5.3 10*3/uL (ref 1.7–7.7)
Neutrophils Relative %: 92 %
Platelets: 143 10*3/uL — ABNORMAL LOW (ref 150–400)
RBC: 4.33 MIL/uL (ref 3.87–5.11)
RDW: 12.1 % (ref 11.5–15.5)
WBC: 5.8 10*3/uL (ref 4.0–10.5)
nRBC: 0 % (ref 0.0–0.2)

## 2023-04-10 LAB — LIPASE, BLOOD: Lipase: 27 U/L (ref 11–51)

## 2023-04-10 MED ORDER — SODIUM CHLORIDE 0.9 % IV BOLUS
500.0000 mL | Freq: Once | INTRAVENOUS | Status: AC
  Administered 2023-04-10: 500 mL via INTRAVENOUS

## 2023-04-10 MED ORDER — IOHEXOL 350 MG/ML SOLN
75.0000 mL | Freq: Once | INTRAVENOUS | Status: AC | PRN
  Administered 2023-04-10: 75 mL via INTRAVENOUS

## 2023-04-10 MED ORDER — ACETAMINOPHEN 500 MG PO TABS
1000.0000 mg | ORAL_TABLET | Freq: Once | ORAL | Status: AC
  Administered 2023-04-10: 1000 mg via ORAL
  Filled 2023-04-10: qty 2

## 2023-04-10 MED ORDER — ONDANSETRON HCL 4 MG PO TABS: 4.0000 mg | ORAL_TABLET | Freq: Three times a day (TID) | ORAL | 0 refills | Status: DC | PRN

## 2023-04-10 MED ORDER — ACETAMINOPHEN 500 MG PO TABS: 1000.0000 mg | ORAL_TABLET | Freq: Once | ORAL | Status: DC

## 2023-04-10 MED ORDER — FENTANYL CITRATE PF 50 MCG/ML IJ SOSY
25.0000 ug | PREFILLED_SYRINGE | Freq: Once | INTRAMUSCULAR | Status: AC
  Administered 2023-04-10: 25 ug via INTRAVENOUS
  Filled 2023-04-10: qty 1

## 2023-04-10 NOTE — Discharge Instructions (Addendum)
It was a pleasure taking care of you today!  Your lab and imaging studies did not show any concerning emergent findings at this time.  You will be sent a prescription for Zofran, take as directed if you are experiencing nausea/vomiting.  If you have to take Zofran, wait at least 30 minutes before you attempt small sips/small bites of food/fluid.  Ensure to maintain fluid intake with water, tea, broth, soup, Pedialyte, Gatorade.  Follow up with your surgeon at [facility] regarding todays ED visit. Follow-up with your primary care provider as needed regarding this ED visit.  Attached is information for the on-call GI specialist, you may call and set up a follow-up appointment regarding today's ED visit.  Return to the emergency department if you experience increasing/worsening symptoms.

## 2023-04-10 NOTE — ED Triage Notes (Signed)
Patient was brought in by EMS from home. Patient states that she has been having pain that has gotten worse in the past 24 hours due to her hernia. She states she has been having nausea, vomiting and diarrhea. Patient states that she is having to have a lot of straining since her surgery on her first hernia. Patient is complaining of pain in her abdomen. 4mg  of Zofran given and of NS. Vitals in route 111/52. HR 83, 98% on room air.

## 2023-04-10 NOTE — ED Provider Notes (Signed)
[place] EMERGENCY DEPARTMENT AT [facility] Provider Note   CSN: [id number] Arrival date & time: 04/10/23  2725     History  Chief Complaint  Patient presents with   Nausea   Emesis   Diarrhea    Regina Daniel is a 52 y.o. female with a past medical history of drug abuse in remission who presents to the emergency department brought in by EMS with concerns for nausea, vomiting, diarrhea times yesterday.  Notes that she has had hernia repair last year in September.  However notes pain to her right lower quadrant.  Patient has associated 3 episodes of watery nonbloody diarrhea daily.  No meds tried at home.  Patient was given Zofran and 500 mL of normal saline with EMS prior to arrival to the ED.  Patient denies chest pain, shortness of breath, urinary symptoms, fever.  The history is provided by the patient. No language interpreter was used.       Home Medications Prior to Admission medications   Medication Sig Start Date End Date Taking? Authorizing Provider  ondansetron (ZOFRAN) 4 MG tablet Take 1 tablet (4 mg total) by mouth every 8 (eight) hours as needed for nausea or vomiting. 04/10/23  Yes Arleene Settle A, PA-C  amoxicillin (AMOXIL) 500 MG capsule Take 2 capsules by mouth now, then 1 capsule by mouth 3 times a day. 05/02/22   [provider]  HYDROcodone-acetaminophen (NORCO) 5-325 MG tablet Take 1 tablet by mouth every 6 (six) hours as needed for moderate pain. 06/11/22   Kirtland Bouchard, PA-C  ibuprofen (ADVIL) 600 MG tablet Take 1 tablet (600 mg total) by mouth every 6 (six) hours as needed. 03/29/22   Fayrene Helper, PA-C  sertraline (ZOLOFT) 25 MG tablet Take 1 tablet (25 mg total) by mouth daily. 12/12/21   Ardis Hughs, NP      Allergies    Patient has no known allergies.    Review of Systems   Review of Systems  Gastrointestinal:  Positive for diarrhea and vomiting.  All other systems reviewed and are negative.   Physical Exam Updated Vital  Signs BP 116/85   Pulse 89   Temp 98.5 F (36.9 C) (Oral)   Resp 19   LMP 11/04/2010   SpO2 98%  Physical Exam Vitals and nursing note reviewed.  Constitutional:      General: She is not in acute distress.    Appearance: She is not diaphoretic.  HENT:     Head: Normocephalic and atraumatic.     Mouth/Throat:     Pharynx: No oropharyngeal exudate.  Eyes:     General: No scleral icterus.    Conjunctiva/sclera: Conjunctivae normal.  Cardiovascular:     Rate and Rhythm: Normal rate and regular rhythm.     Pulses: Normal pulses.     Heart sounds: Normal heart sounds.  Pulmonary:     Effort: Pulmonary effort is normal. No respiratory distress.     Breath sounds: Normal breath sounds. No wheezing.  Abdominal:     General: Bowel sounds are normal.     Palpations: Abdomen is soft. There is no mass.     Tenderness: There is abdominal tenderness in the right lower quadrant and left lower quadrant. There is no guarding or rebound.     Comments: Tenderness to palpation noted to right lower quadrant and left lower quadrant.  Musculoskeletal:        General: Normal range of motion.     Cervical  back: Normal range of motion and neck supple.  Skin:    General: Skin is warm and dry.  Neurological:     Mental Status: She is alert.  Psychiatric:        Behavior: Behavior normal.     ED Results / Procedures / Treatments   Labs (all labs ordered are listed, but only abnormal results are displayed) Labs Reviewed  CBC WITH DIFFERENTIAL/PLATELET - Abnormal; Notable for the following components:      Result Value   Platelets 143 (*)    Lymphs Abs 0.2 (*)    All other components within normal limits  COMPREHENSIVE METABOLIC PANEL - Abnormal; Notable for the following components:   CO2 21 (*)    Glucose, Bld 105 (*)    BUN 22 (*)    Calcium 8.3 (*)    Total Protein 5.5 (*)    Albumin 3.4 (*)    All other components within normal limits  URINALYSIS, ROUTINE W REFLEX MICROSCOPIC -  Abnormal; Notable for the following components:   Hgb urine dipstick SMALL (*)    All other components within normal limits  RAPID URINE DRUG SCREEN, HOSP PERFORMED - Abnormal; Notable for the following components:   Cocaine POSITIVE (*)    All other components within normal limits  LIPASE, BLOOD    EKG EKG Interpretation  Date/Time:  Thursday April 10 2023 08:20:30 EDT Ventricular Rate:  90 PR Interval:  128 QRS Duration: 90 QT Interval:  370 QTC Calculation: 453 R Axis:   64 Text Interpretation: Sinus rhythm Consider right atrial enlargement Artifact in lead(s) I II III aVR aVL aVF V1 V2 V3 V4 V5 V6 Otherwise no significant change Confirmed by Elayne Snare ([id number]) on 04/10/2023 9:58:25 AM  Radiology CT ABDOMEN PELVIS W CONTRAST  Result Date: 04/10/2023 CLINICAL DATA:  Right lower quadrant abdominal pain. Worsening pain over the last 24 hours with some nausea vomiting and diarrhea EXAM: CT ABDOMEN AND PELVIS WITH CONTRAST TECHNIQUE: Multidetector CT imaging of the abdomen and pelvis was performed using the standard protocol following bolus administration of intravenous contrast. RADIATION DOSE REDUCTION: This exam was performed according to the departmental dose-optimization program which includes automated exposure control, adjustment of the mA and/or kV according to patient size and/or use of iterative reconstruction technique. CONTRAST:  75mL OMNIPAQUE IOHEXOL 350 MG/ML SOLN COMPARISON:  CT 10/12/2021 and older FINDINGS: Lower chest: No pleural effusion lung bases. There is some linear opacity seen likely scar or atelectasis. Once again there are several small lung nodules identified the lung bases. These measure up to 7 mm left lower lobe on series 7 image 6. These are unchanged going back to older examinations from 2021 and 2019. No specific imaging follow-up. Hepatobiliary: No focal liver abnormality is seen. Status post cholecystectomy. No biliary dilatation. Patent portal vein  Pancreas: Unremarkable. No pancreatic ductal dilatation or surrounding inflammatory changes. Spleen: Spleen is nonenlarged. Preserved enhancement. There is rim calcified structure at the splenic hilum measuring 10 mm consistent with a calcified splenic artery aneurysm. Adrenals/Urinary Tract: Adrenal glands are preserved. No enhancing renal mass or collecting system dilatation. Focal benign-appearing cleft along the posterior aspect of the right kidney with atrophy, unchanged from previous. Possibly related to old injury or other chronic process. The ureters have normal course and caliber extending down to the bladder. Preserved contours of the urinary bladder. Stomach/Bowel: The stomach is distended with fluid. Proximal duodenal is also fluid-filled. Slightly atypical course to the duodenal. Third portion does not cross all  the way to the left side of the spine and coils back towards the right midabdomen. The jejunum is nondilated. There is short segment intussusception noted in the anterior aspect of the abdomen. No wall thickening, dilatation in this location, likely transient. The ileum is diffusely fluid-filled but nondilated. No mucosal hyperenhancement. Normal appendix is seen caudal to the cecum in the right hemipelvis. The large bowel has some scattered colonic stool but is nondilated. Minimal sigmoid colon diverticula. Vascular/Lymphatic: No significant vascular findings are present. No enlarged abdominal or pelvic lymph nodes. Reproductive: Uterus and bilateral adnexa are unremarkable. Other: No free air or free fluid. Slight rectus muscle diastasis with protuberance of the abdominal contents along the midline including bowel. No other bowel containing hernia. Musculoskeletal: No acute or significant osseous findings. IMPRESSION: Fluid-filled distended bowel diffusely including stomach, ileum. No obstruction, free air or free fluid. Slight protuberance of the anterior abdominal wall with rectus muscle  diastasis without frank herniation. Normal appendix Electronically Signed   By: Karen Kays M.D.   On: 04/10/2023 10:46    Procedures Procedures    Medications Ordered in ED Medications  acetaminophen (TYLENOL) tablet 1,000 mg (has no administration in time range)  sodium chloride 0.9 % bolus 500 mL (0 mLs Intravenous Stopped 04/10/23 1032)  iohexol (OMNIPAQUE) 350 MG/ML injection 75 mL (75 mLs Intravenous Contrast Given 04/10/23 1023)  sodium chloride 0.9 % bolus 500 mL (0 mLs Intravenous Stopped 04/10/23 1215)  fentaNYL (SUBLIMAZE) injection 25 mcg (25 mcg Intravenous Given 04/10/23 1054)    ED Course/ Medical Decision Making/ A&P Clinical Course as of 04/10/23 1252  Thu Apr 10, 2023  1032 Re-evaluated patient resting comfortably on stretcher.  Patient notes that she has been required due to an increase in her pain.  Patient also endorses headache.  Denies numbness or tingling at this time. [SB]  1240 Pt able to tolerate PO challenge in ED.  [SB]  1240 Re-evaluated and noted improvement of symptoms with treatment regimen.  Patient notes that she has a headache and is requesting Tylenol at this time prior to discharge.  Discussed discharge treatment plan. Pt agreeable at this time. Pt appears safe for discharge. [SB]    Clinical Course User Index [SB] Samuella Rasool A, PA-C                             Medical Decision Making Amount and/or Complexity of Data Reviewed Labs: ordered. Radiology: ordered.  Risk OTC drugs. Prescription drug management.   Patient presents to the emergency department with concerns for nausea, vomiting, diarrhea since yesterday. Pt afebrile. On exam, patient with tenderness to palpation noted to right lower quadrant.  No acute cardiovascular respiratory exam findings.  Differential diagnosis includes diverticulitis, appendicitis, acute cystitis, SBO, incarcerated hernia, strangulated hernia.  Labs:  I ordered, and personally interpreted labs.  The pertinent  results include:  \Urinalysis unremarkable UDS notable positive for cocaine Lipase unremarkable CBC without leukocytosis CMP with slightly elevated BUN at 22, stable from previous value otherwise unremarkable.  Imaging: I ordered imaging studies including CT abdomen pelvis with I independently visualized and interpreted imaging which showed  Fluid-filled distended bowel diffusely including stomach, ileum. No  obstruction, free air or free fluid.    Slight protuberance of the anterior abdominal wall with rectus  muscle diastasis without frank herniation.    Normal appendix   I agree with the radiologist interpretation  Medications:  I ordered medication including IV fluids,  fentanyl, Tylenol for symptom management. Reevaluation of the patient after these medicines and interventions, I reevaluated the patient and found that they have improved I have reviewed the patients home medicines and have made adjustments as needed Tolerated PO challenge in ED with above treatment regimen     Disposition: Presenting suspicious for nausea, vomiting, diarrhea, likely a viral etiology.  Doubt concerns at this time for pancreatitis, diverticulitis, appendicitis, cholecystitis, acute cystitis, incarcerated/strangulated hernia, SBO. After consideration of the diagnostic results and the patients response to treatment, I feel that the patient would benefit from Discharge home.  Patient discharged home with a prescription for Zofran.  Patient instructed to follow-up with her surgeon at [facility] surgery regarding today's ED visit.  Supportive care measures and strict return precautions discussed with patient at bedside. Pt acknowledges and verbalizes understanding. Pt appears safe for discharge. Follow up as indicated in discharge paperwork.   This chart was dictated using voice recognition software, Dragon. Despite the best efforts of this provider to proofread and correct errors, errors may still  occur which can change documentation meaning.    Final Clinical Impression(s) / ED Diagnoses Final diagnoses:  Nausea vomiting and diarrhea    Rx / DC Orders ED Discharge Orders          Ordered    ondansetron (ZOFRAN) 4 MG tablet  Every 8 hours PRN        04/10/23 1245              Franchon Ketterman A, PA-C 04/10/23 1311    [place], [place] K, DO 04/10/23 1536

## 2023-04-10 NOTE — ED Notes (Signed)
Patient was able to drink the full cup of water with no breaks or choking noted. Patient passed fluid challenge test per nurse.

## 2023-05-18 ENCOUNTER — Other Ambulatory Visit: Payer: Self-pay

## 2023-05-18 ENCOUNTER — Emergency Department (HOSPITAL_COMMUNITY)
Admission: EM | Admit: 2023-05-18 | Discharge: 2023-05-18 | Disposition: A | Discharge status: Home / Self Care | Payer: No Typology Code available for payment source | Attending: Emergency Medicine | Admitting: Emergency Medicine

## 2023-05-18 ENCOUNTER — Encounter (HOSPITAL_COMMUNITY): Payer: Self-pay | Admitting: *Deleted

## 2023-05-18 DIAGNOSIS — L0231 Cutaneous abscess of buttock: Secondary | ICD-10-CM | POA: Insufficient documentation

## 2023-05-18 DIAGNOSIS — L0291 Cutaneous abscess, unspecified: Secondary | ICD-10-CM

## 2023-05-18 DIAGNOSIS — R300 Dysuria: Secondary | ICD-10-CM | POA: Insufficient documentation

## 2023-05-18 LAB — COMPREHENSIVE METABOLIC PANEL
ALT: 19 U/L (ref 0–44)
AST: 23 U/L (ref 15–41)
Albumin: 3.7 g/dL (ref 3.5–5.0)
Alkaline Phosphatase: 82 U/L (ref 38–126)
Anion gap: 9 (ref 5–15)
BUN: 21 mg/dL — ABNORMAL HIGH (ref 6–20)
CO2: 27 mmol/L (ref 22–32)
Calcium: 9.2 mg/dL (ref 8.9–10.3)
Chloride: 102 mmol/L (ref 98–111)
Creatinine, Ser: 0.93 mg/dL (ref 0.44–1.00)
GFR, Estimated: >60 mL/min (ref >60)
Glucose, Bld: 112 mg/dL — ABNORMAL HIGH (ref 70–99)
Potassium: 3.6 mmol/L (ref 3.5–5.1)
Sodium: 138 mmol/L (ref 135–145)
Total Bilirubin: 0.5 mg/dL (ref 0.3–1.2)
Total Protein: 6.4 g/dL — ABNORMAL LOW (ref 6.5–8.1)

## 2023-05-18 LAB — CBC
HCT: 39.7 % (ref 36.0–46.0)
Hemoglobin: 13.2 g/dL (ref 12.0–15.0)
MCH: 31.7 pg (ref 26.0–34.0)
MCHC: 33.2 g/dL (ref 30.0–36.0)
MCV: 95.2 fL (ref 80.0–100.0)
Platelets: 236 10*3/uL (ref 150–400)
RBC: 4.17 MIL/uL (ref 3.87–5.11)
RDW: 12.1 % (ref 11.5–15.5)
WBC: 8.4 10*3/uL (ref 4.0–10.5)
nRBC: 0 % (ref 0.0–0.2)

## 2023-05-18 LAB — URINALYSIS, ROUTINE W REFLEX MICROSCOPIC
Bilirubin Urine: NEGATIVE
Glucose, UA: NEGATIVE mg/dL
Hgb urine dipstick: NEGATIVE
Ketones, ur: NEGATIVE mg/dL
Nitrite: NEGATIVE
Protein, ur: NEGATIVE mg/dL
Specific Gravity, Urine: 1.027 (ref 1.005–1.030)
pH: 5 (ref 5.0–8.0)

## 2023-05-18 MED ORDER — SULFAMETHOXAZOLE-TRIMETHOPRIM 800-160 MG PO TABS
1.0000 | ORAL_TABLET | Freq: Once | ORAL | Status: AC
  Administered 2023-05-18: 1 via ORAL
  Filled 2023-05-18: qty 1

## 2023-05-18 MED ORDER — SULFAMETHOXAZOLE-TRIMETHOPRIM 800-160 MG PO TABS: 1.0000 | ORAL_TABLET | Freq: Two times a day (BID) | ORAL | 0 refills | Status: AC

## 2023-05-18 MED ORDER — LIDOCAINE-EPINEPHRINE (PF) 2 %-1:200000 IJ SOLN
10.0000 mL | Freq: Once | INTRAMUSCULAR | Status: AC
  Administered 2023-05-18: 10 mL
  Filled 2023-05-18: qty 20

## 2023-05-18 NOTE — Discharge Instructions (Addendum)
You were seen in the emergency department for an abscess.  The area was incised and drained with purulent drainage noted.  Small packing was placed into the wound to try to keep the wound open to heal properly.  Prescription for Bactrim was sent to your pharmacy.  Please take this medication as prescribed.  If you begin to experience any fevers or worsening symptoms, return to the emergency department.

## 2023-05-18 NOTE — ED Triage Notes (Signed)
The pt has an abscess to the upper crack of her buttocks she thinks she was bitten by a spider and the cebter of the abscess actually appears like a bite

## 2023-05-18 NOTE — ED Provider Notes (Signed)
[place] EMERGENCY DEPARTMENT AT [facility] Provider Note   CSN: [id number] Arrival date & time: 05/18/23  1845     History Chief Complaint  Patient presents with   Abscess    Regina Daniel is a 52 y.o. female. Patient presents to the ED with concerns of an abscess. States that she may have received an insect bite.  I asked patient specifically about possible insect bite, but she reports that she does not recall seeing an insect around her but just feels that since he spent some time outdoors that she may have seen an insect bite.  Denies any IV drug use, fevers, rashes.  Reports that this has been worsening over the last few days and has not had any prior abscesses in the similar area.  Denies any obvious drainage that she has noted but did note that she may be saw some slight yellow coloration on her undergarments around the area where they would have been rubbing against the abscess site.  Endorses some mild urinary symptoms with dysuria, increased urinary urgency over the last 2 days as well.  Denies any CVA tenderness or urinary retention.  Abscess      Home Medications Prior to Admission medications   Medication Sig Start Date End Date Taking? Authorizing Provider  sulfamethoxazole-trimethoprim (BACTRIM DS) 800-160 MG tablet Take 1 tablet by mouth 2 (two) times daily for 5 days. 05/18/23 05/23/23 Yes Smitty Knudsen, PA-C  amoxicillin (AMOXIL) 500 MG capsule Take 2 capsules by mouth now, then 1 capsule by mouth 3 times a day. 05/02/22   [provider]  HYDROcodone-acetaminophen (NORCO) 5-325 MG tablet Take 1 tablet by mouth every 6 (six) hours as needed for moderate pain. 06/11/22   Kirtland Bouchard, PA-C  ibuprofen (ADVIL) 600 MG tablet Take 1 tablet (600 mg total) by mouth every 6 (six) hours as needed. 03/29/22   Fayrene Helper, PA-C  ondansetron (ZOFRAN) 4 MG tablet Take 1 tablet (4 mg total) by mouth every 8 (eight) hours as needed for nausea or vomiting. 04/10/23    Blue, [place] A, PA-C  sertraline (ZOLOFT) 25 MG tablet Take 1 tablet (25 mg total) by mouth daily. 12/12/21   Ardis Hughs, NP      Allergies    Patient has no known allergies.    Review of Systems   Review of Systems  Skin:  Positive for wound.  All other systems reviewed and are negative.   Physical Exam Updated Vital Signs BP 94/63 (BP Location: Left Arm)   Pulse 80   Temp 97.7 F (36.5 C) (Oral)   Resp 16   Ht 5\' 2"  (1.575 m)   Wt 48 kg   LMP 11/04/2010   SpO2 99%   BMI 19.35 kg/m  Physical Exam Vitals and nursing note reviewed.  HENT:     Head: Normocephalic and atraumatic.  Eyes:     General: No scleral icterus.       Right eye: No discharge.        Left eye: No discharge.  Cardiovascular:     Rate and Rhythm: Normal rate and regular rhythm.  Abdominal:     General: Abdomen is flat. There is no distension.     Palpations: Abdomen is soft.     Tenderness: There is no right CVA tenderness or left CVA tenderness.  Skin:    General: Skin is warm and dry.     Findings: Erythema present. No rash.  Comments: Approximately 3 to 4 cm area of erythema with fluctuance noted at the central area at the right gluteal fold as marked on patient diagram above..  No obvious drainage noted.     ED Results / Procedures / Treatments   Labs (all labs ordered are listed, but only abnormal results are displayed) Labs Reviewed  COMPREHENSIVE METABOLIC PANEL - Abnormal; Notable for the following components:      Result Value   Glucose, Bld 112 (*)    BUN 21 (*)    Total Protein 6.4 (*)    All other components within normal limits  URINALYSIS, ROUTINE W REFLEX MICROSCOPIC - Abnormal; Notable for the following components:   APPearance HAZY (*)    Leukocytes,Ua MODERATE (*)    Bacteria, UA FEW (*)    All other components within normal limits  CBC    EKG None  Radiology No results found.  Procedures .Marland KitchenIncision and Drainage  Date/Time: 05/20/2023 1:45  PM  Performed by: Smitty Knudsen, PA-C Authorized by: Smitty Knudsen, PA-C   Consent:    Consent obtained:  Verbal   Consent given by:  Patient   Risks discussed:  Bleeding and incomplete drainage   Alternatives discussed:  No treatment Universal protocol:    Patient identity confirmed:  Verbally with patient Location:    Type:  Abscess   Size:  4cm   Location:  Lower extremity   Lower extremity location:  Buttock   Buttock location:  L buttock Pre-procedure details:    Skin preparation:  Chlorhexidine Sedation:    Sedation type:  None Anesthesia:    Anesthesia method:  Local infiltration   Local anesthetic:  Lidocaine 2% WITH epi Procedure type:    Complexity:  Simple Procedure details:    Ultrasound guidance: no     Needle aspiration: no     Incision types:  Single straight   Incision depth:  Dermal   Wound management:  Irrigated with saline, probed and deloculated, extensive cleaning and debrided   Drainage:  Purulent   Drainage amount:  Moderate   Wound treatment:  Wound left open   Packing materials:  1/4 in iodoform gauze   Amount 1/4" iodoform:  5in Post-procedure details:    Procedure completion:  Tolerated    Medications Ordered in ED Medications  lidocaine-EPINEPHrine (XYLOCAINE W/EPI) 2 %-1:200000 (PF) injection 10 mL (10 mLs Infiltration Given 05/18/23 2121)  sulfamethoxazole-trimethoprim (BACTRIM DS) 800-160 MG per tablet 1 tablet (1 tablet Oral Given 05/18/23 2158)    ED Course/ Medical Decision Making/ A&P                           Medical Decision Making Risk Prescription drug management.   This patient presents to the ED for concern of abscess.  Differential diagnosis includes abscess, cyst, pilonidal cyst, cellulitis, ingrown hair, folliculitis   Lab Tests:  I Ordered, and personally interpreted labs.  The pertinent results include: CBC unremarkable with no signs of infection with white count at 8.4, CMP unremarkable, UA with possible  signs of infection as there are leukocytes and bacteria present but no blood.   Medicines ordered and prescription drug management:  I ordered medication including lidocaine with epinephrine, Bactrim for anesthesia, cellulitis Reevaluation of the patient after these medicines showed that the patient improved I have reviewed the patients home medicines and have made adjustments as needed   Problem List / ED Course:  Patient presents to the emergency  department concerns of an abscess.  Reports that she may have sustained an insect bite but does not recall noticing an insect around her around the time of this popped up.  No prior history of pilonidal cyst.  Reports that the cyst/abscess was present in the right gluteal cleft at the superior aspect. On examination, there does appear to be an area of some erythema and induration with skin thinning at the central site with notable fluctuance and some pus noted beneath.  Appears that there may have been some drainage already here as there is some crusting in the gluteal cleft itself away from the wound site.  I&D will be performed. I&D performed with a moderate amount of purulent discharge drained.  The wound was probed and deloculated and irrigated extensively with packing placed.  Patient tolerated procedure well.  Given the extent of cellulitis in this area, will treat patient with Bactrim antibiotic to reduce the risk of complications with worsening cellulitis.  Advised patient that she should follow-up with her primary care provider or return to the emergency department for repeat evaluation of the wound and particularly she feels that the wound is worsening. Given her lack of urinary symptoms, I believe the patient is not likely experiencing a UTI at this time.  Advised patient to seek further evaluation if she does begin to experience urinary symptoms.  Patient is agreeable to treatment plan verbalized understanding of return precautions.  All  questions answered prior to patient discharge.  Final Clinical Impression(s) / ED Diagnoses Final diagnoses:  Abscess    Rx / DC Orders ED Discharge Orders          Ordered    sulfamethoxazole-trimethoprim (BACTRIM DS) 800-160 MG tablet  2 times daily        05/18/23 2206              Smitty Knudsen, PA-C 05/20/23 1348    Glyn Ade, MD 05/22/23 1455

## 2023-05-21 ENCOUNTER — Telehealth: Payer: Self-pay | Admitting: *Deleted

## 2023-05-21 NOTE — Telephone Encounter (Signed)
Pt called regarding abscess, draining and tenderness. RNCM advised to return to medical facility should her symptoms become worse.  Pt states that the area looks better and she was advised that it would take about 2 weeks to heal completely.

## 2023-08-04 ENCOUNTER — Encounter (HOSPITAL_COMMUNITY): Payer: Self-pay | Admitting: Emergency Medicine

## 2023-08-04 ENCOUNTER — Emergency Department (HOSPITAL_COMMUNITY)
Admission: EM | Admit: 2023-08-04 | Discharge: 2023-08-04 | Disposition: A | Discharge status: Home / Self Care | Payer: Self-pay | Attending: Emergency Medicine | Admitting: Emergency Medicine

## 2023-08-04 ENCOUNTER — Emergency Department (HOSPITAL_COMMUNITY): Payer: Self-pay

## 2023-08-04 DIAGNOSIS — R0781 Pleurodynia: Secondary | ICD-10-CM | POA: Insufficient documentation

## 2023-08-04 DIAGNOSIS — R109 Unspecified abdominal pain: Secondary | ICD-10-CM | POA: Insufficient documentation

## 2023-08-04 LAB — COMPREHENSIVE METABOLIC PANEL
ALT: 15 U/L (ref 0–44)
AST: 16 U/L (ref 15–41)
Albumin: 3.8 g/dL (ref 3.5–5.0)
Alkaline Phosphatase: 78 U/L (ref 38–126)
Anion gap: 6 (ref 5–15)
BUN: 16 mg/dL (ref 6–20)
CO2: 26 mmol/L (ref 22–32)
Calcium: 8.9 mg/dL (ref 8.9–10.3)
Chloride: 107 mmol/L (ref 98–111)
Creatinine, Ser: 0.84 mg/dL (ref 0.44–1.00)
GFR, Estimated: >60 mL/min (ref >60)
Glucose, Bld: 87 mg/dL (ref 70–99)
Potassium: 3.7 mmol/L (ref 3.5–5.1)
Sodium: 139 mmol/L (ref 135–145)
Total Bilirubin: 0.4 mg/dL (ref 0.3–1.2)
Total Protein: 6.3 g/dL — ABNORMAL LOW (ref 6.5–8.1)

## 2023-08-04 LAB — I-STAT CHEM 8, ED
BUN: 21 mg/dL — ABNORMAL HIGH (ref 6–20)
Calcium, Ion: 1 mmol/L — ABNORMAL LOW (ref 1.15–1.40)
Chloride: 107 mmol/L (ref 98–111)
Creatinine, Ser: 0.8 mg/dL (ref 0.44–1.00)
Glucose, Bld: 88 mg/dL (ref 70–99)
HCT: 40 % (ref 36.0–46.0)
Hemoglobin: 13.6 g/dL (ref 12.0–15.0)
Potassium: 3.9 mmol/L (ref 3.5–5.1)
Sodium: 140 mmol/L (ref 135–145)
TCO2: 24 mmol/L (ref 22–32)

## 2023-08-04 LAB — CBC
HCT: 41.6 % (ref 36.0–46.0)
Hemoglobin: 13.4 g/dL (ref 12.0–15.0)
MCH: 30.9 pg (ref 26.0–34.0)
MCHC: 32.2 g/dL (ref 30.0–36.0)
MCV: 95.9 fL (ref 80.0–100.0)
Platelets: 192 10*3/uL (ref 150–400)
RBC: 4.34 MIL/uL (ref 3.87–5.11)
RDW: 12 % (ref 11.5–15.5)
WBC: 4.7 10*3/uL (ref 4.0–10.5)
nRBC: 0 % (ref 0.0–0.2)

## 2023-08-04 LAB — LIPASE, BLOOD: Lipase: 33 U/L (ref 11–51)

## 2023-08-04 MED ORDER — TRAMADOL HCL 50 MG PO TABS: 50.0000 mg | ORAL_TABLET | Freq: Four times a day (QID) | ORAL | 0 refills | Status: DC | PRN

## 2023-08-04 MED ORDER — IOHEXOL 350 MG/ML SOLN
75.0000 mL | Freq: Once | INTRAVENOUS | Status: AC | PRN
  Administered 2023-08-04: 75 mL via INTRAVENOUS

## 2023-08-04 MED ORDER — CELECOXIB 200 MG PO CAPS: 200.0000 mg | ORAL_CAPSULE | Freq: Two times a day (BID) | ORAL | 0 refills | Status: DC

## 2023-08-04 NOTE — ED Notes (Signed)
Pt ambulated to the bathroom and ambulated back to bed without difficulty.  Pt did not have any dizziness or weakness when walking.
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
## 2023-08-04 NOTE — Discharge Instructions (Addendum)
There are no no rib fractures, no bruises to your lungs or any other significant abnormality. I am discharging you with some medications to take for pain. May also use over-the-counter lidocaine patches. This should resolve on its own Get help right away if you: Have difficulty breathing or shortness of breath. Develop a continual cough, or you cough up thick or bloody mucus from your lungs (sputum). Feel nauseous or you vomit. Have pain in your abdomen.
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
## 2023-10-07 ENCOUNTER — Emergency Department (HOSPITAL_COMMUNITY): Payer: No Typology Code available for payment source

## 2023-10-07 ENCOUNTER — Emergency Department (HOSPITAL_COMMUNITY)
Admission: EM | Admit: 2023-10-07 | Discharge: 2023-10-07 | Disposition: A | Discharge status: Home / Self Care | Payer: 59 | Attending: Emergency Medicine | Admitting: Emergency Medicine

## 2023-10-07 ENCOUNTER — Other Ambulatory Visit: Payer: Self-pay

## 2023-10-07 ENCOUNTER — Telehealth: Payer: Self-pay

## 2023-10-07 ENCOUNTER — Emergency Department (HOSPITAL_COMMUNITY): Payer: 59

## 2023-10-07 ENCOUNTER — Encounter (HOSPITAL_COMMUNITY): Payer: Self-pay

## 2023-10-07 DIAGNOSIS — S022XXA Fracture of nasal bones, initial encounter for closed fracture: Secondary | ICD-10-CM

## 2023-10-07 DIAGNOSIS — S0231XA Fracture of orbital floor, right side, initial encounter for closed fracture: Secondary | ICD-10-CM | POA: Insufficient documentation

## 2023-10-07 DIAGNOSIS — S0101XA Laceration without foreign body of scalp, initial encounter: Secondary | ICD-10-CM

## 2023-10-07 DIAGNOSIS — Z23 Encounter for immunization: Secondary | ICD-10-CM | POA: Insufficient documentation

## 2023-10-07 DIAGNOSIS — S0285XA Fracture of orbit, unspecified, initial encounter for closed fracture: Secondary | ICD-10-CM

## 2023-10-07 DIAGNOSIS — R040 Epistaxis: Secondary | ICD-10-CM

## 2023-10-07 DIAGNOSIS — S0992XA Unspecified injury of nose, initial encounter: Secondary | ICD-10-CM | POA: Diagnosis present

## 2023-10-07 DIAGNOSIS — S0501XA Injury of conjunctiva and corneal abrasion without foreign body, right eye, initial encounter: Secondary | ICD-10-CM | POA: Insufficient documentation

## 2023-10-07 MED ORDER — TETANUS-DIPHTH-ACELL PERTUSSIS 5-2.5-18.5 LF-MCG/0.5 IM SUSY
0.5000 mL | PREFILLED_SYRINGE | Freq: Once | INTRAMUSCULAR | Status: AC
  Administered 2023-10-07: 0.5 mL via INTRAMUSCULAR
  Filled 2023-10-07: qty 0.5

## 2023-10-07 MED ORDER — CIPROFLOXACIN HCL 0.3 % OP SOLN: 2.0000 [drp] | OPHTHALMIC | 0 refills | Status: DC

## 2023-10-07 MED ORDER — AMOXICILLIN-POT CLAVULANATE 875-125 MG PO TABS: 1.0000 | ORAL_TABLET | Freq: Two times a day (BID) | ORAL | 0 refills | Status: DC

## 2023-10-07 MED ORDER — TETRACAINE HCL 0.5 % OP SOLN
2.0000 [drp] | Freq: Once | OPHTHALMIC | Status: AC
  Administered 2023-10-07: 2 [drp] via OPHTHALMIC
  Filled 2023-10-07: qty 4

## 2023-10-07 MED ORDER — LIDOCAINE-EPINEPHRINE-TETRACAINE (LET) TOPICAL GEL
3.0000 mL | Freq: Once | TOPICAL | Status: AC
  Administered 2023-10-07: 3 mL via TOPICAL
  Filled 2023-10-07: qty 3

## 2023-10-07 MED ORDER — FLUORESCEIN SODIUM 1 MG OP STRP
1.0000 | ORAL_STRIP | Freq: Once | OPHTHALMIC | Status: AC
  Administered 2023-10-07: 1 via OPHTHALMIC
  Filled 2023-10-07: qty 1

## 2023-10-07 MED ORDER — AMOXICILLIN-POT CLAVULANATE 875-125 MG PO TABS
1.0000 | ORAL_TABLET | Freq: Once | ORAL | Status: AC
  Administered 2023-10-07: 1 via ORAL
  Filled 2023-10-07: qty 1

## 2023-10-07 MED ORDER — SILVER NITRATE-POT NITRATE 75-25 % EX MISC
CUTANEOUS | Status: AC
  Filled 2023-10-07: qty 10

## 2023-10-07 MED ORDER — TRAMADOL HCL 50 MG PO TABS: 50.0000 mg | ORAL_TABLET | Freq: Four times a day (QID) | ORAL | 0 refills | Status: DC | PRN

## 2023-10-07 MED ORDER — OXYCODONE-ACETAMINOPHEN 5-325 MG PO TABS
1.0000 | ORAL_TABLET | Freq: Once | ORAL | Status: AC
  Administered 2023-10-07: 1 via ORAL
  Filled 2023-10-07: qty 1

## 2023-10-07 MED ORDER — KETOROLAC TROMETHAMINE 30 MG/ML IJ SOLN
30.0000 mg | Freq: Once | INTRAMUSCULAR | Status: DC
  Filled 2023-10-07: qty 1

## 2023-10-07 MED ORDER — KETOROLAC TROMETHAMINE 30 MG/ML IJ SOLN
30.0000 mg | Freq: Once | INTRAMUSCULAR | Status: AC
  Administered 2023-10-07: 30 mg via INTRAMUSCULAR
  Filled 2023-10-07: qty 1
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
## 2023-10-07 NOTE — Telephone Encounter (Signed)
Unable to leave vm. Called patient to schedule ED follow up with ENT Provider. Will try calling again tomorrow due to having sx today.
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
## 2023-10-12 ENCOUNTER — Ambulatory Visit (HOSPITAL_COMMUNITY): Payer: No Typology Code available for payment source

## 2023-10-13 ENCOUNTER — Other Ambulatory Visit: Payer: Self-pay

## 2023-10-13 ENCOUNTER — Ambulatory Visit (HOSPITAL_COMMUNITY)
Admission: EM | Admit: 2023-10-13 | Discharge: 2023-10-13 | Disposition: A | Discharge status: Home / Self Care | Payer: 59 | Attending: Family Medicine | Admitting: Family Medicine

## 2023-10-13 ENCOUNTER — Telehealth: Payer: Self-pay | Admitting: Otolaryngology

## 2023-10-13 ENCOUNTER — Encounter (HOSPITAL_COMMUNITY): Payer: Self-pay | Admitting: *Deleted

## 2023-10-13 DIAGNOSIS — Z4802 Encounter for removal of sutures: Secondary | ICD-10-CM
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
## 2023-10-13 NOTE — Telephone Encounter (Signed)
Patient came into office to schedule ED follow up with Dr Jearld Fenton. I have scheduled her appt for !2/.08/2023 at 10 AM

## 2023-10-13 NOTE — ED Notes (Signed)
2 staples removed from posterior scalp. Site clean dry and intact.

## 2023-10-14 ENCOUNTER — Ambulatory Visit (INDEPENDENT_AMBULATORY_CARE_PROVIDER_SITE_OTHER): Payer: No Typology Code available for payment source | Admitting: Otolaryngology

## 2024-02-13 ENCOUNTER — Other Ambulatory Visit: Payer: Self-pay | Admitting: Otolaryngology

## 2024-02-18 ENCOUNTER — Telehealth (HOSPITAL_COMMUNITY): Payer: Self-pay | Admitting: Licensed Clinical Social Worker

## 2024-02-23 ENCOUNTER — Other Ambulatory Visit (HOSPITAL_COMMUNITY): Payer: Self-pay

## 2024-02-23 ENCOUNTER — Telehealth (HOSPITAL_COMMUNITY): Payer: Self-pay | Admitting: Professional

## 2024-03-01 ENCOUNTER — Encounter (HOSPITAL_BASED_OUTPATIENT_CLINIC_OR_DEPARTMENT_OTHER): Payer: Self-pay | Admitting: Otolaryngology

## 2024-03-01 ENCOUNTER — Other Ambulatory Visit: Payer: Self-pay

## 2024-03-02 ENCOUNTER — Encounter (HOSPITAL_BASED_OUTPATIENT_CLINIC_OR_DEPARTMENT_OTHER)
Admission: RE | Admit: 2024-03-02 | Discharge: 2024-03-02 | Disposition: A | Discharge status: Home / Self Care | Source: Ambulatory Visit | Attending: Otolaryngology | Admitting: Otolaryngology

## 2024-03-02 DIAGNOSIS — F1910 Other psychoactive substance abuse, uncomplicated: Secondary | ICD-10-CM | POA: Insufficient documentation

## 2024-03-02 DIAGNOSIS — Z01812 Encounter for preprocedural laboratory examination: Secondary | ICD-10-CM | POA: Insufficient documentation

## 2024-03-02 LAB — COMPREHENSIVE METABOLIC PANEL WITH GFR
ALT: 13 U/L (ref 0–44)
AST: 15 U/L (ref 15–41)
Albumin: 3.5 g/dL (ref 3.5–5.0)
Alkaline Phosphatase: 101 U/L (ref 38–126)
Anion gap: 7 (ref 5–15)
BUN: 14 mg/dL (ref 6–20)
CO2: 23 mmol/L (ref 22–32)
Calcium: 9.2 mg/dL (ref 8.9–10.3)
Chloride: 109 mmol/L (ref 98–111)
Creatinine, Ser: 0.89 mg/dL (ref 0.44–1.00)
GFR, Estimated: >60 mL/min (ref >60)
Glucose, Bld: 79 mg/dL (ref 70–99)
Potassium: 4.4 mmol/L (ref 3.5–5.1)
Sodium: 139 mmol/L (ref 135–145)
Total Bilirubin: 0.5 mg/dL (ref 0.0–1.2)
Total Protein: 6.1 g/dL — ABNORMAL LOW (ref 6.5–8.1)

## 2024-03-07 NOTE — Anesthesia Preprocedure Evaluation (Signed)
 Anesthesia Evaluation  Patient identified by MRN, date of birth, ID band Patient awake    Reviewed: Allergy & Precautions, NPO status , Patient's Chart, lab work & pertinent test results  History of Anesthesia Complications Negative for: history of anesthetic complications  Airway Mallampati: II  TM Distance: >3 FB Neck ROM: Full    Dental  (+) Missing, Poor Dentition, Chipped,    Pulmonary Current Smoker   Pulmonary exam normal        Cardiovascular negative cardio ROS Normal cardiovascular exam     Neuro/Psych   Anxiety Depression Bipolar Disorder   negative neurological ROS     GI/Hepatic negative GI ROS,,,Remote hx of cocaine abuse   Endo/Other  negative endocrine ROS    Renal/GU negative Renal ROS     Musculoskeletal negative musculoskeletal ROS (+)    Abdominal   Peds  Hematology negative hematology ROS (+)   Anesthesia Other Findings Deviated nasal septum  Reproductive/Obstetrics                              Anesthesia Physical Anesthesia Plan  ASA: 2  Anesthesia Plan: General   Post-op Pain Management: Tylenol  PO (pre-op)*   Induction: Intravenous  PONV Risk Score and Plan: 3 and Treatment may vary due to age or medical condition, Ondansetron , Dexamethasone , Midazolam , Propofol  infusion, TIVA and Scopolamine patch - Pre-op  Airway Management Planned: Oral ETT  Additional Equipment: None  Intra-op Plan:   Post-operative Plan: Extubation in OR  Informed Consent: I have reviewed the patients History and Physical, chart, labs and discussed the procedure including the risks, benefits and alternatives for the proposed anesthesia with the patient or authorized representative who has indicated his/her understanding and acceptance.     Dental advisory given  Plan Discussed with: CRNA  Anesthesia Plan Comments:         Anesthesia Quick Evaluation

## 2024-03-08 ENCOUNTER — Encounter (HOSPITAL_BASED_OUTPATIENT_CLINIC_OR_DEPARTMENT_OTHER)
Admission: RE | Disposition: A | Discharge status: Home / Self Care | Payer: Self-pay | Source: Home / Self Care | Attending: Otolaryngology

## 2024-03-08 ENCOUNTER — Encounter (HOSPITAL_BASED_OUTPATIENT_CLINIC_OR_DEPARTMENT_OTHER): Payer: Self-pay | Admitting: Otolaryngology

## 2024-03-08 ENCOUNTER — Other Ambulatory Visit: Payer: Self-pay

## 2024-03-08 ENCOUNTER — Ambulatory Visit (HOSPITAL_BASED_OUTPATIENT_CLINIC_OR_DEPARTMENT_OTHER)
Admission: RE | Admit: 2024-03-08 | Discharge: 2024-03-08 | Disposition: A | Discharge status: Home / Self Care | Attending: Otolaryngology | Admitting: Otolaryngology

## 2024-03-08 ENCOUNTER — Ambulatory Visit (HOSPITAL_BASED_OUTPATIENT_CLINIC_OR_DEPARTMENT_OTHER): Payer: Self-pay | Admitting: Anesthesiology

## 2024-03-08 DIAGNOSIS — J34829 Nasal valve collapse, unspecified: Secondary | ICD-10-CM | POA: Insufficient documentation

## 2024-03-08 DIAGNOSIS — J3489 Other specified disorders of nose and nasal sinuses: Secondary | ICD-10-CM | POA: Diagnosis not present

## 2024-03-08 DIAGNOSIS — F17200 Nicotine dependence, unspecified, uncomplicated: Secondary | ICD-10-CM | POA: Insufficient documentation

## 2024-03-08 DIAGNOSIS — F419 Anxiety disorder, unspecified: Secondary | ICD-10-CM | POA: Diagnosis not present

## 2024-03-08 DIAGNOSIS — J342 Deviated nasal septum: Secondary | ICD-10-CM | POA: Insufficient documentation

## 2024-03-08 DIAGNOSIS — J343 Hypertrophy of nasal turbinates: Secondary | ICD-10-CM | POA: Insufficient documentation

## 2024-03-08 DIAGNOSIS — F319 Bipolar disorder, unspecified: Secondary | ICD-10-CM | POA: Insufficient documentation

## 2024-03-08 DIAGNOSIS — F1910 Other psychoactive substance abuse, uncomplicated: Secondary | ICD-10-CM

## 2024-03-08 HISTORY — DX: Bipolar disorder, current episode manic without psychotic features, unspecified: F31.10

## 2024-03-08 HISTORY — PX: SEPTOPLASTY: SHX2393

## 2024-03-08 HISTORY — DX: Post-traumatic stress disorder, unspecified: F43.10

## 2024-03-08 HISTORY — PX: TURBINATE REDUCTION: SHX6157

## 2024-03-08 SURGERY — REDUCTION, NASAL TURBINATE
Anesthesia: General | Site: Nose | Laterality: Bilateral

## 2024-03-08 MED ORDER — FENTANYL CITRATE (PF) 100 MCG/2ML IJ SOLN: 25.0000 ug | INTRAMUSCULAR | Status: DC | PRN

## 2024-03-08 MED ORDER — ACETAMINOPHEN 500 MG PO TABS
1000.0000 mg | ORAL_TABLET | Freq: Once | ORAL | Status: AC
  Administered 2024-03-08: 1000 mg via ORAL

## 2024-03-08 MED ORDER — OXYCODONE HCL 5 MG/5ML PO SOLN: 5.0000 mg | Freq: Once | ORAL | Status: DC | PRN

## 2024-03-08 MED ORDER — LIDOCAINE 2% (20 MG/ML) 5 ML SYRINGE
INTRAMUSCULAR | Status: AC
  Filled 2024-03-08: qty 5

## 2024-03-08 MED ORDER — CEFAZOLIN SODIUM-DEXTROSE 2-4 GM/100ML-% IV SOLN: 2.0000 g | INTRAVENOUS | Status: DC

## 2024-03-08 MED ORDER — CEFAZOLIN SODIUM-DEXTROSE 2-4 GM/100ML-% IV SOLN
INTRAVENOUS | Status: AC
  Filled 2024-03-08: qty 100

## 2024-03-08 MED ORDER — LIDOCAINE 2% (20 MG/ML) 5 ML SYRINGE
INTRAMUSCULAR | Status: DC | PRN
  Administered 2024-03-08: 60 mg via INTRAVENOUS

## 2024-03-08 MED ORDER — ACETAMINOPHEN 500 MG PO TABS
ORAL_TABLET | ORAL | Status: AC
  Filled 2024-03-08: qty 2

## 2024-03-08 MED ORDER — ROCURONIUM BROMIDE 10 MG/ML (PF) SYRINGE
PREFILLED_SYRINGE | INTRAVENOUS | Status: AC
  Filled 2024-03-08: qty 10

## 2024-03-08 MED ORDER — DROPERIDOL 2.5 MG/ML IJ SOLN: 0.6250 mg | Freq: Once | INTRAMUSCULAR | Status: DC | PRN

## 2024-03-08 MED ORDER — LACTATED RINGERS IV SOLN: INTRAVENOUS | Status: DC | PRN

## 2024-03-08 MED ORDER — FENTANYL CITRATE (PF) 100 MCG/2ML IJ SOLN
INTRAMUSCULAR | Status: AC
Start: 2024-03-08 — End: ?
  Filled 2024-03-08: qty 2

## 2024-03-08 MED ORDER — BUPIVACAINE-EPINEPHRINE (PF) 0.25% -1:200000 IJ SOLN
INTRAMUSCULAR | Status: AC
  Filled 2024-03-08: qty 30

## 2024-03-08 MED ORDER — BUPIVACAINE-EPINEPHRINE (PF) 0.25% -1:200000 IJ SOLN
INTRAMUSCULAR | Status: DC | PRN
  Administered 2024-03-08: 10 mL

## 2024-03-08 MED ORDER — PROPOFOL 500 MG/50ML IV EMUL
INTRAVENOUS | Status: DC | PRN
  Administered 2024-03-08: 150 ug/kg/min via INTRAVENOUS

## 2024-03-08 MED ORDER — MIDAZOLAM HCL 5 MG/5ML IJ SOLN
INTRAMUSCULAR | Status: DC | PRN
  Administered 2024-03-08: 2 mg via INTRAVENOUS

## 2024-03-08 MED ORDER — DEXAMETHASONE SODIUM PHOSPHATE 10 MG/ML IJ SOLN
INTRAMUSCULAR | Status: AC
  Filled 2024-03-08: qty 1

## 2024-03-08 MED ORDER — DEXAMETHASONE SODIUM PHOSPHATE 10 MG/ML IJ SOLN
INTRAMUSCULAR | Status: DC | PRN
  Administered 2024-03-08: 10 mg via INTRAVENOUS

## 2024-03-08 MED ORDER — SODIUM CHLORIDE 0.9 % IR SOLN
Status: DC | PRN
  Administered 2024-03-08: 1

## 2024-03-08 MED ORDER — 0.9 % SODIUM CHLORIDE (POUR BTL) OPTIME
TOPICAL | Status: DC | PRN
  Administered 2024-03-08: 1000 mL

## 2024-03-08 MED ORDER — SUGAMMADEX SODIUM 200 MG/2ML IV SOLN
INTRAVENOUS | Status: DC | PRN
  Administered 2024-03-08: 200 mg via INTRAVENOUS

## 2024-03-08 MED ORDER — FENTANYL CITRATE (PF) 100 MCG/2ML IJ SOLN
INTRAMUSCULAR | Status: AC
  Filled 2024-03-08: qty 2

## 2024-03-08 MED ORDER — OXYMETAZOLINE HCL 0.05 % NA SOLN
NASAL | Status: DC | PRN
  Administered 2024-03-08: 1 via TOPICAL

## 2024-03-08 MED ORDER — SCOPOLAMINE 1 MG/3DAYS TD PT72
1.0000 | MEDICATED_PATCH | Freq: Once | TRANSDERMAL | Status: DC
  Administered 2024-03-08: 1.5 mg via TRANSDERMAL

## 2024-03-08 MED ORDER — FENTANYL CITRATE (PF) 100 MCG/2ML IJ SOLN
INTRAMUSCULAR | Status: DC | PRN
  Administered 2024-03-08: 50 ug via INTRAVENOUS
  Administered 2024-03-08: 100 ug via INTRAVENOUS

## 2024-03-08 MED ORDER — ONDANSETRON HCL 4 MG/2ML IJ SOLN
INTRAMUSCULAR | Status: AC
  Filled 2024-03-08: qty 2

## 2024-03-08 MED ORDER — PROPOFOL 10 MG/ML IV BOLUS
INTRAVENOUS | Status: DC | PRN
  Administered 2024-03-08: 120 mg via INTRAVENOUS

## 2024-03-08 MED ORDER — MIDAZOLAM HCL 2 MG/2ML IJ SOLN
INTRAMUSCULAR | Status: AC
Start: 2024-03-08 — End: ?
  Filled 2024-03-08: qty 2

## 2024-03-08 MED ORDER — SCOPOLAMINE 1 MG/3DAYS TD PT72
MEDICATED_PATCH | TRANSDERMAL | Status: AC
  Filled 2024-03-08: qty 1

## 2024-03-08 MED ORDER — OXYCODONE HCL 5 MG PO TABS: 5.0000 mg | ORAL_TABLET | Freq: Once | ORAL | Status: DC | PRN

## 2024-03-08 MED ORDER — EPHEDRINE SULFATE-NACL 50-0.9 MG/10ML-% IV SOSY
PREFILLED_SYRINGE | INTRAVENOUS | Status: DC | PRN
  Administered 2024-03-08 (×2): 5 mg via INTRAVENOUS

## 2024-03-08 MED ORDER — ONDANSETRON HCL 4 MG/2ML IJ SOLN
INTRAMUSCULAR | Status: DC | PRN
  Administered 2024-03-08: 4 mg via INTRAVENOUS

## 2024-03-08 MED ORDER — ROCURONIUM BROMIDE 100 MG/10ML IV SOLN
INTRAVENOUS | Status: DC | PRN
  Administered 2024-03-08: 40 mg via INTRAVENOUS

## 2024-03-08 MED ORDER — LACTATED RINGERS IV SOLN: INTRAVENOUS | Status: DC

## 2024-03-08 SURGICAL SUPPLY — 46 items
BENZOIN TINCTURE PRP APPL 2/3 (GAUZE/BANDAGES/DRESSINGS) ×2 IMPLANT
BLADE INF TURB ROT M4 2 5PK (BLADE) IMPLANT
BLADE SHAVER TURBINATE 11X2.9 (BLADE) ×1 IMPLANT
BLADE SURG 11 STRL SS (BLADE) ×3 IMPLANT
CANISTER SUCT 1200ML W/VALVE (MISCELLANEOUS) ×3 IMPLANT
COAGULATOR SUCT 8FR VV (MISCELLANEOUS) IMPLANT
COAGULATOR SUCT SWTCH 10FR 6 (ELECTROSURGICAL) IMPLANT
CORD BIPOLAR FORCEPS 12FT (ELECTRODE) ×3 IMPLANT
DRSG TEGADERM 2-3/8X2-3/4 SM (GAUZE/BANDAGES/DRESSINGS) IMPLANT
DRSG TELFA 3X8 NADH STRL (GAUZE/BANDAGES/DRESSINGS) ×3 IMPLANT
ELECT NDL BLADE 2-5/6 (NEEDLE) IMPLANT
ELECT NEEDLE BLADE 2-5/6 (NEEDLE) IMPLANT
ELECTRODE REM PT RTRN 9FT ADLT (ELECTROSURGICAL) ×2 IMPLANT
FORCEPS BIPOLAR SPETZLER 8 1.0 (NEUROSURGERY SUPPLIES) IMPLANT
GAUZE SPONGE 2X2 STRL 8-PLY (GAUZE/BANDAGES/DRESSINGS) ×3 IMPLANT
GAUZE VASELINE FOILPK 1/2 X 72 (GAUZE/BANDAGES/DRESSINGS) ×2 IMPLANT
GLOVE BIO SURGEON STRL SZ7 (GLOVE) ×3 IMPLANT
GLOVE SURG SS PI 6.5 STRL IVOR (GLOVE) ×1 IMPLANT
GOWN STRL REUS W/ TWL LRG LVL3 (GOWN DISPOSABLE) ×6 IMPLANT
IV SET EXT 30 76VOL 4 MALE LL (IV SETS) IMPLANT
MARKER SKIN DUAL TIP RULER LAB (MISCELLANEOUS) IMPLANT
NDL PRECISIONGLIDE 27X1.5 (NEEDLE) ×2 IMPLANT
NEEDLE PRECISIONGLIDE 27X1.5 (NEEDLE) ×3 IMPLANT
NS IRRIG 1000ML POUR BTL (IV SOLUTION) ×3 IMPLANT
PACK BASIN DAY SURGERY FS (CUSTOM PROCEDURE TRAY) ×3 IMPLANT
PACK ENT DAY SURGERY (CUSTOM PROCEDURE TRAY) ×3 IMPLANT
PATTIES SURGICAL .5 X3 (DISPOSABLE) ×3 IMPLANT
PENCIL SMOKE EVACUATOR (MISCELLANEOUS) IMPLANT
SLEEVE SCD COMPRESS KNEE MED (STOCKING) ×3 IMPLANT
SPLINT NASAL AIRWAY SILICONE (MISCELLANEOUS) IMPLANT
SPLINT NASAL DENVER LRG BLUSH (MISCELLANEOUS) IMPLANT
SPLINT NASAL DENVER SM/MD BLUS (MISCELLANEOUS) IMPLANT
SUT CHROMIC 3 0 SH 27 (SUTURE) ×1 IMPLANT
SUT CHROMIC 4 0 RB 1X27 (SUTURE) ×3 IMPLANT
SUT CHROMIC 6 0 G 1 (SUTURE) IMPLANT
SUT MNCRL AB 4-0 PS2 18 (SUTURE) IMPLANT
SUT MON AB 5-0 PS2 18 (SUTURE) ×1 IMPLANT
SUT PDS AB 4-0 RB1 27 (SUTURE) IMPLANT
SUT PDS II 5-0 VIOLET 1X18 TF (SUTURE) IMPLANT
SUT PLAIN 4 0 ~~LOC~~ 1 (SUTURE) IMPLANT
SUT PLAIN GUT FAST 5-0 (SUTURE) IMPLANT
SUT SILK 2 0 SH (SUTURE) IMPLANT
TAPE PAPER 1/2X10 TAN MEDIPORE (MISCELLANEOUS) ×3 IMPLANT
TOWEL GREEN STERILE FF (TOWEL DISPOSABLE) ×3 IMPLANT
TUBE SALEM SUMP 16F (TUBING) ×3 IMPLANT
YANKAUER SUCT BULB TIP NO VENT (SUCTIONS) ×3 IMPLANT
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
## 2024-03-08 NOTE — Discharge Instructions (Signed)
  Post Anesthesia Home Care Instructions Next tylenol  dose after 5pm Activity: Get plenty of rest for the remainder of the day. A responsible individual must stay with you for 24 hours following the procedure.  For the next 24 hours, DO NOT: -Drive a car -Advertising copywriter -Drink alcoholic beverages -Take any medication unless instructed by your physician -Make any legal decisions or sign important papers.  Meals: Start with liquid foods such as gelatin or soup. Progress to regular foods as tolerated. Avoid greasy, spicy, heavy foods. If nausea and/or vomiting occur, drink only clear liquids until the nausea and/or vomiting subsides. Call your physician if vomiting continues.  Special Instructions/Symptoms: Your throat may feel dry or sore from the anesthesia or the breathing tube placed in your throat during surgery. If this causes discomfort, gargle with warm salt water. The discomfort should disappear within 24 hours.  If you had a scopolamine patch placed behind your ear for the management of post- operative nausea and/or vomiting:  1. The medication in the patch is effective for 72 hours, after which it should be removed.  Wrap patch in a tissue and discard in the trash. Wash hands thoroughly with soap and water. 2. You may remove the patch earlier than 72 hours if you experience unpleasant side effects which may include dry mouth, dizziness or visual disturbances. 3. Avoid touching the patch. Wash your hands with soap and water after contact with the patch.    Post Anesthesia Home Care Instructions  Activity: Get plenty of rest for the remainder of the day. A responsible individual must stay with you for 24 hours following the procedure.  For the next 24 hours, DO NOT: -Drive a car -Advertising copywriter -Drink alcoholic beverages -Take any medication unless instructed by your physician -Make any legal decisions or sign important papers.  Meals: Start with liquid foods such as  gelatin or soup. Progress to regular foods as tolerated. Avoid greasy, spicy, heavy foods. If nausea and/or vomiting occur, drink only clear liquids until the nausea and/or vomiting subsides. Call your physician if vomiting continues.  Special Instructions/Symptoms: Your throat may feel dry or sore from the anesthesia or the breathing tube placed in your throat during surgery. If this causes discomfort, gargle with warm salt water. The discomfort should disappear within 24 hours.  If you had a scopolamine patch placed behind your ear for the management of post- operative nausea and/or vomiting:  1. The medication in the patch is effective for 72 hours, after which it should be removed.  Wrap patch in a tissue and discard in the trash. Wash hands thoroughly with soap and water. 2. You may remove the patch earlier than 72 hours if you experience unpleasant side effects which may include dry mouth, dizziness or visual disturbances. 3. Avoid touching the patch. Wash your hands with soap and water after contact with the patch.

## 2024-03-08 NOTE — Op Note (Signed)
 OPERATIVE NOTE  Regina Daniel Date/Time of Admission: 03/08/2024  8:35 AM  CSN: [id number];MRN:[id number] Attending Provider: Atiya Yera, MD Room/Bed: [facility]/NONE DOB: Mar 25, 1971 Age: 53 y.o.   Pre-Op Diagnosis: Deviated nasal septum, Nasal obstruction  Post-Op Diagnosis: Deviated nasal septum, Nasal obstruction  Procedure: Procedure(s): Septoplasty Repair of nasal vestibular stenosis Submucosal resection of bilateral inferior turbinate  Anesthesia: General  Surgeon(s): Zach Camisha Srey, MD  Staff: Circulator: Jolaine Nasuti, RN Relief Circulator: Ricke Charleston, RN Relief Scrub: Tasia Farr K Scrub Person: Miachel Adams L  Implants: * No implants in log *  Specimens: * No specimens in log *  Complications: none  EBL: 25 ML  Condition: stable  Operative Findings:  Severe right traumatic septal deviation with internal nasal valve collapse.  Description of Operation: After informed consent was obtained the patient was brought back to the operating room and given general anesthesia.  The nose was prepped with Afrin-soaked cottonoids and injected with 0.25% Marcaine  with epinephrine .  Bilateral marginal incisions were connected with an inverted "V" columellar incision and the soft tissue elevated over the lower lateral cartilages and nasal dorsum.  The domes were then divided and the anterior septal angle was approached.  Bilateral mucoperichondrial flaps were then raised back to the area of previous septoplasty.  The deviated portions of residual cartilage were then removed and used later for grafting.  The caudal septum was also dislocated off to the right side and this was repositioned after releasing it from the maxillary crest.  Next to spreader grafts approximately 3 x 12 mm in length were then secured between the upper lateral cartilages and the dorsal septum and secured using a 4-0 Monocryl suture.  This was done to address the internal nasal  valve collapse.  The domes were also reconstituted with the same suture.  The septal flaps were then sutured in place using a 4-0 chromic suture in mattress fashion.  The marginal incisions were then closed with 4-0 chromic, the columella closed with 5-0 chromic and a septocolumellar 3-0 chromic placed to medialize the caudal septum.  A stab incision was made at the head of the inferior turbinates bilaterally.  Starting with the right side a submucosal dissection plane was created and the submucosal hypertrophied tissue was removed using a microdebrider.  The same was performed on the left side.  This concluded the procedure and the patient was transferred over to anesthesia in stable condition.

## 2024-03-08 NOTE — Anesthesia Postprocedure Evaluation (Signed)
 Anesthesia Post Note  Patient: Regina Daniel  Procedure(s) Performed: REDUCTION, NASAL TURBINATE (Bilateral) RHINOPLASTY (Bilateral) SEPTOPLASTY, NOSE (Nose)     Patient location during evaluation: PACU Anesthesia Type: General Level of consciousness: awake and alert Pain management: pain level controlled Vital Signs Assessment: post-procedure vital signs reviewed and stable Respiratory status: spontaneous breathing, nonlabored ventilation and respiratory function stable Cardiovascular status: blood pressure returned to baseline Postop Assessment: no apparent nausea or vomiting Anesthetic complications: no   No notable events documented.  Last Vitals:  Vitals:   03/08/24 1300 03/08/24 1315  BP: (!) 91/51 (!) 95/55  Pulse: (!) 56 (!) 53  Resp: 15 15  Temp:    SpO2: 99% 100%    Last Pain:  Vitals:   03/08/24 1315  TempSrc:   PainSc: Asleep                 Rayfield Cairo

## 2024-03-08 NOTE — Anesthesia Procedure Notes (Signed)
 Procedure Name: Intubation Date/Time: 03/08/2024 11:57 AM  Performed by: Glorya Larsson, CRNAPre-anesthesia Checklist: Patient identified, Emergency Drugs available, Suction available and Patient being monitored Patient Re-evaluated:Patient Re-evaluated prior to induction Oxygen Delivery Method: Circle System Utilized Preoxygenation: Pre-oxygenation with 100% oxygen Induction Type: IV induction Ventilation: Mask ventilation without difficulty Laryngoscope Size: Mac and 3 Grade View: Grade I Tube type: Oral Tube size: 7.0 mm Number of attempts: 1 Airway Equipment and Method: Stylet Placement Confirmation: ETT inserted through vocal cords under direct vision, positive ETCO2 and breath sounds checked- equal and bilateral Secured at: 22 cm Tube secured with: Tape Dental Injury: Teeth and Oropharynx as per pre-operative assessment

## 2024-03-08 NOTE — Transfer of Care (Signed)
 Immediate Anesthesia Transfer of Care Note  Patient: Regina Daniel  Procedure(s) Performed: REDUCTION, NASAL TURBINATE (Bilateral) RHINOPLASTY (Bilateral) SEPTOPLASTY, NOSE (Nose)  Patient Location: PACU  Anesthesia Type:General  Level of Consciousness: awake, alert , and oriented  Airway & Oxygen Therapy: Patient Spontanous Breathing and Patient connected to face mask oxygen  Post-op Assessment: Report given to RN and Post -op Vital signs reviewed and stable  Post vital signs: Reviewed and stable  Last Vitals:  Vitals Value Taken Time  BP 95/43 03/08/24 1250  Temp    Pulse 61 03/08/24 1254  Resp 13 03/08/24 1254  SpO2 96 % 03/08/24 1254  Vitals shown include unfiled device data.  Last Pain:  Vitals:   03/08/24 0908  TempSrc: Temporal  PainSc: 0-No pain      Patients Stated Pain Goal: 3 (03/08/24 0908)  Complications: No notable events documented.

## 2024-03-09 ENCOUNTER — Encounter (HOSPITAL_BASED_OUTPATIENT_CLINIC_OR_DEPARTMENT_OTHER): Payer: Self-pay | Admitting: Otolaryngology

## 2024-07-23 ENCOUNTER — Emergency Department (HOSPITAL_COMMUNITY)

## 2024-07-23 ENCOUNTER — Encounter (HOSPITAL_COMMUNITY): Payer: Self-pay | Admitting: Emergency Medicine

## 2024-07-23 ENCOUNTER — Other Ambulatory Visit: Payer: Self-pay

## 2024-07-23 ENCOUNTER — Emergency Department (HOSPITAL_COMMUNITY)
Admission: EM | Admit: 2024-07-23 | Discharge: 2024-07-23 | Disposition: A | Discharge status: Home / Self Care | Attending: Emergency Medicine | Admitting: Emergency Medicine

## 2024-07-23 DIAGNOSIS — F1410 Cocaine abuse, uncomplicated: Secondary | ICD-10-CM | POA: Insufficient documentation

## 2024-07-23 DIAGNOSIS — R0789 Other chest pain: Secondary | ICD-10-CM | POA: Diagnosis present

## 2024-07-23 DIAGNOSIS — R110 Nausea: Secondary | ICD-10-CM | POA: Insufficient documentation

## 2024-07-23 DIAGNOSIS — R079 Chest pain, unspecified: Secondary | ICD-10-CM

## 2024-07-23 LAB — CBC
HCT: 42.6 % (ref 36.0–46.0)
Hemoglobin: 14.4 g/dL (ref 12.0–15.0)
MCH: 29.3 pg (ref 26.0–34.0)
MCHC: 33.8 g/dL (ref 30.0–36.0)
MCV: 86.6 fL (ref 80.0–100.0)
Platelets: 267 K/uL (ref 150–400)
RBC: 4.92 MIL/uL (ref 3.87–5.11)
RDW: 12.4 % (ref 11.5–15.5)
WBC: 10.1 K/uL (ref 4.0–10.5)
nRBC: 0 % (ref 0.0–0.2)

## 2024-07-23 LAB — COMPREHENSIVE METABOLIC PANEL WITH GFR
ALT: 20 U/L (ref 0–44)
AST: 29 U/L (ref 15–41)
Albumin: 4.1 g/dL (ref 3.5–5.0)
Alkaline Phosphatase: 122 U/L (ref 38–126)
Anion gap: 10 (ref 5–15)
BUN: 19 mg/dL (ref 6–20)
CO2: 21 mmol/L — ABNORMAL LOW (ref 22–32)
Calcium: 8.9 mg/dL (ref 8.9–10.3)
Chloride: 106 mmol/L (ref 98–111)
Creatinine, Ser: 1.18 mg/dL — ABNORMAL HIGH (ref 0.44–1.00)
GFR, Estimated: 56 mL/min — ABNORMAL LOW (ref >60)
Glucose, Bld: 110 mg/dL — ABNORMAL HIGH (ref 70–99)
Potassium: 3.8 mmol/L (ref 3.5–5.1)
Sodium: 137 mmol/L (ref 135–145)
Total Bilirubin: 0.7 mg/dL (ref 0.0–1.2)
Total Protein: 7.1 g/dL (ref 6.5–8.1)

## 2024-07-23 LAB — RAPID URINE DRUG SCREEN, HOSP PERFORMED
Amphetamines: NOT DETECTED
Barbiturates: NOT DETECTED
Benzodiazepines: NOT DETECTED
Cocaine: POSITIVE — AB
Opiates: NOT DETECTED
Tetrahydrocannabinol: NOT DETECTED

## 2024-07-23 LAB — TROPONIN I (HIGH SENSITIVITY)
Troponin I (High Sensitivity): 5 ng/L (ref <18)
Troponin I (High Sensitivity): 4 ng/L (ref <18)

## 2024-07-23 MED ORDER — LACTATED RINGERS IV BOLUS
1000.0000 mL | Freq: Once | INTRAVENOUS | Status: AC
  Administered 2024-07-23: 1000 mL via INTRAVENOUS

## 2024-07-23 MED ORDER — ONDANSETRON HCL 4 MG/2ML IJ SOLN
4.0000 mg | Freq: Once | INTRAMUSCULAR | Status: AC
  Administered 2024-07-23: 4 mg via INTRAVENOUS
  Filled 2024-07-23: qty 2

## 2024-07-23 MED ORDER — ONDANSETRON 4 MG PO TBDP: 4.0000 mg | ORAL_TABLET | Freq: Once | ORAL | Status: DC

## 2024-07-23 NOTE — ED Provider Notes (Signed)
 [place] [facility] AT [facility] Provider Note   CSN: [id number] Arrival date & time: 07/23/24  9482     Patient presents with: Chest Pain   Regina Daniel is a 53 y.o. female.  With a history of cocaine use who presents to the ED for chest pain.  Patient has a longstanding history of crack cocaine use.  She was previously sober since April on but then relapsed on cocaine 2 days ago.  She has been smoking crack cocaine continuously for the last 36 hours.  Has not had much to eat or drink.  Feels shaky and weak all over.  Reports some chest pain and nausea this morning.  Is currently living with her aunt and uncle.  Denies SI HI.  No alcohol or concomitant substance use of other drugs    Chest Pain      Prior to Admission medications   Medication Sig Start Date End Date Taking? Authorizing Provider  gabapentin  (NEURONTIN ) 300 MG capsule Take 300 mg by mouth 2 (two) times daily.    [provider]  hydrOXYzine  (ATARAX ) 25 MG tablet Take 25 mg by mouth 3 (three) times daily as needed.    [provider]  hydrOXYzine  (ATARAX ) 50 MG tablet Take 50 mg by mouth 3 (three) times daily as needed.    [provider]  ibuprofen  (ADVIL ) 600 MG tablet Take 600 mg by mouth every 6 (six) hours as needed.    [provider]  QUEtiapine (SEROQUEL) 100 MG tablet Take 100 mg by mouth at bedtime.    [provider]    Allergies: Patient has no known allergies.    Review of Systems  Cardiovascular:  Positive for chest pain.    Updated Vital Signs BP 100/62   Pulse (!) 59   Temp 98.2 F (36.8 C) (Oral)   Resp 16   Ht 5' 2 (1.575 m)   Wt 61.7 kg   LMP 11/04/2010   SpO2 96%   BMI 24.87 kg/m   Physical Exam Vitals and nursing note reviewed.  HENT:     Head: Normocephalic and atraumatic.     Comments: Rhinorrhea Eyes:     Pupils: Pupils are equal, round, and reactive to light.  Cardiovascular:     Rate and Rhythm: Normal  rate and regular rhythm.  Pulmonary:     Effort: Pulmonary effort is normal.     Breath sounds: Normal breath sounds.  Abdominal:     Palpations: Abdomen is soft.     Tenderness: There is no abdominal tenderness.  Skin:    General: Skin is warm and dry.  Neurological:     Mental Status: She is alert.  Psychiatric:        Mood and Affect: Mood normal.     (all labs ordered are listed, but only abnormal results are displayed) Labs Reviewed  COMPREHENSIVE METABOLIC PANEL WITH GFR - Abnormal; Notable for the following components:      Result Value   CO2 21 (*)    Glucose, Bld 110 (*)    Creatinine, Ser 1.18 (*)    GFR, Estimated 56 (*)    All other components within normal limits  CBC  RAPID URINE DRUG SCREEN, HOSP PERFORMED  TROPONIN I (HIGH SENSITIVITY)  TROPONIN I (HIGH SENSITIVITY)    EKG: None  Radiology: DG Chest 2 View Result Date: 07/23/2024 CLINICAL DATA:  53 year old female with chest pain. EXAM: CHEST - 2 VIEW COMPARISON:  Portable chest  10/07/2023 and earlier. FINDINGS: AP and lateral views 0552 hours. Lung volumes are chronically at the upper limits of normal. Normal cardiac size and mediastinal contours. Visualized tracheal air column is within normal limits. Lung markings are stable since 2021. No pneumothorax, pulmonary edema, pleural effusion or confluent lung opacity. No acute osseous abnormality identified. Negative visible bowel gas. IMPRESSION: No acute cardiopulmonary abnormality. Electronically Signed   By: VEAR Hurst M.D.   On: 07/23/2024 06:17     Procedures   Medications Ordered in the ED  lactated ringers  bolus 1,000 mL (1,000 mLs Intravenous New Bag/Given 07/23/24 0803)  ondansetron  (ZOFRAN ) injection 4 mg (4 mg Intravenous Given 07/23/24 0805)    Clinical Course as of 07/23/24 1015  Fri Jul 23, 2024  1013 Patient resting comfortably in ED.  No ischemic changes on EKG.  Delta troponin flat.  Remainder of laboratory workup unremarkable.  Chest x-ray  looks clear.  She is able to eat and drink without issue.  She will be discharged with instruction for outpatient resources for substance abuse counseling [MP]    Clinical Course User Index [MP] Pamella Ozell LABOR, DO                                 Medical Decision Making 53 year old female with history as above presenting after relapse on crack cocaine use.  Smokes crack.  No IV or other drug use.  No alcohol use.  Reporting chest pain and nausea.  Has not had much to eat or drink in the last 2 days cocaine binge.  No SI HI.  Will obtain cardiac workup including high sensitive troponin provide Zofran  for nausea vomiting.  Not significantly tachycardic.  No need for benzodiazepines at this time.  Risk Prescription drug management.        Final diagnoses:  Chest pain, unspecified type  Cocaine abuse [facility])    ED Discharge Orders     None          Pamella Ozell LABOR, DO 07/23/24 1015

## 2024-07-23 NOTE — ED Triage Notes (Signed)
 BIB [facility]  from the extended stay that the pt is residing at. Hx of cocaine abuse, but pt relapsed and has been on a binge since Wednesday. Pt states that she has only had cocaine, pepsi and water since Wednesday. Pt is c/o chest pain that is a burning/achy sensation in the middle of her chest. Pt has been vomiting since onset of pain. Pt was give 4 mg Zofran  en route and not had any vomiting since. 200 mL NS.   BP 122/80- initially hypotensive @ 104 systolic HR 74 Spo2 97 RA 20 g lt wrist  Last Cocaine use at 0130 this AM.

## 2024-07-23 NOTE — Discharge Instructions (Signed)
 You were seen in the emergency room for chest pain in the setting of cocaine use Your blood work EKG and chest x-ray looked okay You need to follow-up with your primary doctor You can also go to the [facility] here in [place] at the address listed above for help with substance use Also see the resource guide for outpatient substance use counseling provided Return to the emergency department for concerns of overdose chest pain trouble breathing or any other issues

## 2024-07-23 NOTE — ED Provider Triage Note (Signed)
 Emergency Medicine Provider Triage Evaluation Note  Regina Daniel , a 53 y.o. female  was evaluated in triage.  Pt complains of chest pain beginning this evening around 11 PM.  Patient reports that she has been on a binge using cocaine to the amount of $300 since Wednesday.  She reports that she recently relapsed.  Denies any other drug use.  Denies alcohol.  Denies shortness of breath.  Arrives nauseous without vomiting.  Denies leg swelling, lightheadedness or dizziness.  Review of Systems  Positive:  Negative:   Physical Exam  Ht 5' 2 (1.575 m)   Wt 61.7 kg   LMP 11/04/2010   BMI 24.87 kg/m  Gen:   Awake, no distress   Resp:  Normal effort  MSK:   Moves extremities without difficulty  Other:    Medical Decision Making  Medically screening exam initiated at 5:36 AM.  Appropriate orders placed.  Regina Daniel was informed that the remainder of the evaluation will be completed by another provider, this initial triage assessment does not replace that evaluation, and the importance of remaining in the ED until their evaluation is complete.     Ruthell Lonni FALCON, PA-C 07/23/24 [phone]

## 2024-07-26 ENCOUNTER — Other Ambulatory Visit (INDEPENDENT_AMBULATORY_CARE_PROVIDER_SITE_OTHER): Payer: Self-pay

## 2024-07-26 ENCOUNTER — Ambulatory Visit (INDEPENDENT_AMBULATORY_CARE_PROVIDER_SITE_OTHER): Admitting: Physician Assistant

## 2024-07-26 ENCOUNTER — Encounter: Payer: Self-pay | Admitting: Physician Assistant

## 2024-07-26 DIAGNOSIS — G8929 Other chronic pain: Secondary | ICD-10-CM | POA: Diagnosis not present

## 2024-07-26 DIAGNOSIS — M25562 Pain in left knee: Secondary | ICD-10-CM

## 2024-07-26 MED ORDER — LIDOCAINE HCL 1 % IJ SOLN
3.0000 mL | INTRAMUSCULAR | Status: AC | PRN
  Administered 2024-07-26: 3 mL

## 2024-07-26 MED ORDER — METHYLPREDNISOLONE ACETATE 40 MG/ML IJ SUSP
40.0000 mg | INTRAMUSCULAR | Status: AC | PRN
  Administered 2024-07-26: 40 mg via INTRA_ARTICULAR

## 2024-07-26 NOTE — Progress Notes (Signed)
 Office Visit Note   Patient: Regina Daniel           Date of Birth: Jul 11, 1971           MRN: [id number] Visit Date: 07/26/2024              Requested by: Barbra Odor, NP [address] [place],  [place] [address] PCP: Barbra Odor, NP   Assessment & Plan: Visit Diagnoses:  1. 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  We will have her work on quad strengthening particularly her VMO as shown and discussed.  Will see her back in 4 weeks see how she is doing overall.  Questions were encouraged and answered at length.  Follow-Up Instructions: Return in about 4 weeks (around 08/23/2024).   Orders:  Orders Placed This Encounter  Procedures   XR Knee 1-2 Views Left   No orders of the defined types were placed in this encounter.     Procedures: Large Joint Inj: L knee on 07/26/2024 5:18 PM Indications: pain Details: 22 G 1.5 in needle, anterolateral approach  Arthrogram: No  Medications: 3 mL lidocaine  1 %; 40 mg methylPREDNISolone  acetate 40 MG/ML Outcome: tolerated well, no immediate complications Procedure, treatment alternatives, risks and benefits explained, specific risks discussed. Consent was given by the patient. Immediately prior to procedure a time out was called to verify the correct patient, procedure, equipment, support staff and site/side marked as required. Patient was prepped and draped in the usual sterile fashion.       Clinical Data: No additional findings.   Subjective: Chief Complaint  Patient presents with   Left Knee - Pain    HPI Regina Daniel is a 53 year old female who is well-known to Dr. Vernetta service.  Comes in today for left knee pain.  She states knee pain has been ongoing for 5 to 6 years.  Pain is worse with standing and stairs.  The pain has gotten worse since July of this year.  Pain is anterior aspect of the knee just below the patella deep in  the joint.  Feels the knee is weak and might give way.  Painful popping.  Notes some catching and some swelling of the knee at times.  She is nondiabetic.  Review of Systems Negative for fevers chills or ongoing infections  Objective: Vital Signs: LMP 11/04/2010   Physical Exam General: Well-developed well-nourished female no acute distress mood and affect appropriate uses no assistive devices to ambulate.  Ortho Exam Bilateral knees: Good range of motion of both knees.  No abnormal warmth erythema or effusion of either knee.  Slight crepitus left knee only.  No instability valgus varus stressing of either knee.  Nontender along medial lateral joint line.  Manipulation of the left patella causes pain.  Regina Daniel's positive on the left negative on the right. Specialty Comments:  No specialty comments available.  Imaging: XR Knee 1-2 Views Left Result Date: 07/26/2024 AP lateral views left knee: No acute fractures.  All 3 compartments are well-preserved.  No bony abnormalities.  Knee is well located.    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  Past Surgical History:  Procedure Laterality Date   CHOLECYSTECTOMY     NASAL SEPTUM SURGERY  MARCH 2017   ORIF WRIST FRACTURE Left 03/29/2016   Procedure: LEFT WRIST OSTEOTOMY WITH OPEN REDUCTION INTERNAL FIXATION (ORIF) ;  Surgeon:  Lonni CINDERELLA Poli, MD;  Location: [facility];  Service: Orthopedics;  Laterality: Left;  Left Intra-clavicular block   SEPTOPLASTY  03/08/2024   Procedure: SEPTOPLASTY;  Surgeon: Maggie Hussar, MD;  Location: [facility];  Service: ENT;;   TUBAL LIGATION     TURBINATE REDUCTION Bilateral 03/08/2024   Procedure: SUBMUCOSAL RESECTION OF BILATERAL INFERIOR TURBINATE, REPAIR OF NASAL VESTIBULAR STENOSIS;  Surgeon: Maggie Hussar, MD;  Location: [facility];  Service: ENT;  Laterality: Bilateral;  BILATERAL INFERIOR TURBINATE REDUCTION   WRIST SURGERY Right YEARS AGO   TENDON REPAIR   Social History   Occupational History   Not on file  Tobacco Use   Smoking status: Every Day    Current packs/day: 0.50    Average packs/day: 0.5 packs/day for 32.0 years (16.0 ttl pk-yrs)    Types: Cigarettes   Smokeless tobacco: Never   Tobacco comments:    USING E CIGARETTE NOW  Vaping Use   Vaping status: Former  Substance and Sexual Activity   Alcohol use: No   Drug use: Not Currently    Types: Cocaine    Comment: last use 11/09/23   Sexual activity: Not on file

## 2024-07-31 ENCOUNTER — Other Ambulatory Visit (HOSPITAL_COMMUNITY)
Admission: EM | Admit: 2024-07-31 | Discharge: 2024-07-31 | Disposition: A | Discharge status: Other Acute Inpatient Hospital

## 2024-07-31 ENCOUNTER — Other Ambulatory Visit (INDEPENDENT_AMBULATORY_CARE_PROVIDER_SITE_OTHER)
Admission: EM | Admit: 2024-07-31 | Discharge: 2024-08-02 | Disposition: A | Discharge status: Home / Self Care | Source: Intra-hospital | Attending: Psychiatry | Admitting: Psychiatry

## 2024-07-31 DIAGNOSIS — F411 Generalized anxiety disorder: Secondary | ICD-10-CM | POA: Insufficient documentation

## 2024-07-31 DIAGNOSIS — Z599 Problem related to housing and economic circumstances, unspecified: Secondary | ICD-10-CM | POA: Insufficient documentation

## 2024-07-31 DIAGNOSIS — F17290 Nicotine dependence, other tobacco product, uncomplicated: Secondary | ICD-10-CM | POA: Insufficient documentation

## 2024-07-31 DIAGNOSIS — Z91410 Personal history of adult physical and sexual abuse: Secondary | ICD-10-CM | POA: Insufficient documentation

## 2024-07-31 DIAGNOSIS — Z634 Disappearance and death of family member: Secondary | ICD-10-CM | POA: Diagnosis not present

## 2024-07-31 DIAGNOSIS — Z653 Problems related to other legal circumstances: Secondary | ICD-10-CM | POA: Diagnosis not present

## 2024-07-31 DIAGNOSIS — F1494 Cocaine use, unspecified with cocaine-induced mood disorder: Secondary | ICD-10-CM | POA: Diagnosis present

## 2024-07-31 DIAGNOSIS — Z79899 Other long term (current) drug therapy: Secondary | ICD-10-CM | POA: Diagnosis not present

## 2024-07-31 DIAGNOSIS — F4310 Post-traumatic stress disorder, unspecified: Secondary | ICD-10-CM | POA: Insufficient documentation

## 2024-07-31 DIAGNOSIS — F32A Depression, unspecified: Secondary | ICD-10-CM | POA: Diagnosis not present

## 2024-07-31 DIAGNOSIS — R9431 Abnormal electrocardiogram [ECG] [EKG]: Secondary | ICD-10-CM | POA: Diagnosis not present

## 2024-07-31 LAB — URINALYSIS, ROUTINE W REFLEX MICROSCOPIC
Bilirubin Urine: NEGATIVE
Glucose, UA: NEGATIVE mg/dL
Hgb urine dipstick: NEGATIVE
Ketones, ur: 5 mg/dL — AB
Nitrite: NEGATIVE
Protein, ur: 30 mg/dL — AB
Specific Gravity, Urine: 1.027 (ref 1.005–1.030)
pH: 6 (ref 5.0–8.0)

## 2024-07-31 LAB — POCT URINE DRUG SCREEN - MANUAL ENTRY (I-SCREEN)
POC Amphetamine UR: NOT DETECTED
POC Buprenorphine (BUP): POSITIVE — AB
POC Cocaine UR: POSITIVE — AB
POC Marijuana UR: POSITIVE — AB
POC Methadone UR: NOT DETECTED
POC Methamphetamine UR: NOT DETECTED
POC Morphine: NOT DETECTED
POC Oxazepam (BZO): NOT DETECTED
POC Oxycodone UR: NOT DETECTED
POC Secobarbital (BAR): NOT DETECTED

## 2024-07-31 LAB — POC URINE PREG, ED: Preg Test, Ur: NEGATIVE

## 2024-07-31 LAB — POCT PREGNANCY, URINE: Preg Test, Ur: NEGATIVE

## 2024-07-31 MED ORDER — DIPHENHYDRAMINE HCL 50 MG PO CAPS: 50.0000 mg | ORAL_CAPSULE | Freq: Three times a day (TID) | ORAL | Status: DC | PRN

## 2024-07-31 MED ORDER — NICOTINE 21 MG/24HR TD PT24
21.0000 mg | MEDICATED_PATCH | Freq: Every day | TRANSDERMAL | Status: DC
  Administered 2024-07-31 – 2024-08-02 (×3): 21 mg via TRANSDERMAL
  Filled 2024-07-31 (×3): qty 1

## 2024-07-31 MED ORDER — HALOPERIDOL 5 MG PO TABS: 5.0000 mg | ORAL_TABLET | Freq: Three times a day (TID) | ORAL | Status: DC | PRN

## 2024-07-31 MED ORDER — GABAPENTIN 300 MG PO CAPS
300.0000 mg | ORAL_CAPSULE | Freq: Two times a day (BID) | ORAL | Status: DC
  Administered 2024-07-31 – 2024-08-02 (×5): 300 mg via ORAL
  Filled 2024-07-31 (×5): qty 1

## 2024-07-31 MED ORDER — MAGNESIUM HYDROXIDE 400 MG/5ML PO SUSP: 30.0000 mL | Freq: Every day | ORAL | Status: DC | PRN

## 2024-07-31 MED ORDER — PRAZOSIN HCL 2 MG PO CAPS
2.0000 mg | ORAL_CAPSULE | Freq: Every day | ORAL | Status: DC
  Administered 2024-07-31 – 2024-08-01 (×2): 2 mg via ORAL
  Filled 2024-07-31 (×2): qty 1

## 2024-07-31 MED ORDER — HALOPERIDOL LACTATE 5 MG/ML IJ SOLN: 5.0000 mg | Freq: Three times a day (TID) | INTRAMUSCULAR | Status: DC | PRN

## 2024-07-31 MED ORDER — LORAZEPAM 2 MG/ML IJ SOLN: 2.0000 mg | Freq: Three times a day (TID) | INTRAMUSCULAR | Status: DC | PRN

## 2024-07-31 MED ORDER — HYDROXYZINE HCL 25 MG PO TABS
25.0000 mg | ORAL_TABLET | Freq: Three times a day (TID) | ORAL | Status: DC | PRN
  Administered 2024-07-31 – 2024-08-02 (×4): 25 mg via ORAL
  Filled 2024-07-31 (×4): qty 1

## 2024-07-31 MED ORDER — HALOPERIDOL LACTATE 5 MG/ML IJ SOLN: 10.0000 mg | Freq: Three times a day (TID) | INTRAMUSCULAR | Status: DC | PRN

## 2024-07-31 MED ORDER — HYDROXYZINE HCL 25 MG PO TABS: 25.0000 mg | ORAL_TABLET | Freq: Three times a day (TID) | ORAL | Status: DC | PRN

## 2024-07-31 MED ORDER — TRAZODONE HCL 50 MG PO TABS: 50.0000 mg | ORAL_TABLET | Freq: Every evening | ORAL | Status: DC | PRN

## 2024-07-31 MED ORDER — DIPHENHYDRAMINE HCL 50 MG/ML IJ SOLN: 50.0000 mg | Freq: Three times a day (TID) | INTRAMUSCULAR | Status: DC | PRN

## 2024-07-31 MED ORDER — QUETIAPINE FUMARATE 100 MG PO TABS
100.0000 mg | ORAL_TABLET | Freq: Every day | ORAL | Status: DC
  Administered 2024-07-31 – 2024-08-01 (×2): 100 mg via ORAL
  Filled 2024-07-31 (×2): qty 1

## 2024-07-31 MED ORDER — ALUM & MAG HYDROXIDE-SIMETH 200-200-20 MG/5ML PO SUSP
30.0000 mL | ORAL | Status: DC | PRN
  Administered 2024-08-01: 30 mL via ORAL
  Filled 2024-07-31: qty 30

## 2024-07-31 MED ORDER — ONDANSETRON 4 MG PO TBDP
4.0000 mg | ORAL_TABLET | Freq: Three times a day (TID) | ORAL | Status: DC | PRN
  Administered 2024-07-31 (×2): 4 mg via ORAL
  Filled 2024-07-31 (×2): qty 1

## 2024-07-31 MED ORDER — ACETAMINOPHEN 325 MG PO TABS: 650.0000 mg | ORAL_TABLET | Freq: Four times a day (QID) | ORAL | Status: DC | PRN

## 2024-07-31 MED ORDER — ALUM & MAG HYDROXIDE-SIMETH 200-200-20 MG/5ML PO SUSP: 30.0000 mL | ORAL | Status: DC | PRN

## 2024-07-31 NOTE — Group Note (Signed)
 Group Topic: Decisional Balance/Substance Abuse  Group Date: 07/31/2024 Start Time: 8069 End Time: 2000 Facilitators: Verdon Jacqualyn BRAVO, NT  Department: [facility]  Number of Participants: 5  Group Focus: chemical dependency education Treatment Modality:  Individual Therapy Interventions utilized were group exercise Purpose: relapse prevention strategies  Name: Regina Daniel Date of Birth: [birth year]  MR: [id number]    Level of Participation: active Quality of Participation: cooperative Interactions with others: gave feedback Mood/Affect: appropriate Triggers (if applicable): n/a Cognition: coherent/clear Progress: Moderate Response: n/a Plan: follow-up needed  Patients Problems:  Patient Active Problem List   Diagnosis Date Noted   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

## 2024-07-31 NOTE — ED Notes (Signed)
 Pt A&O x 4, awake, alert & responsive, no distress noted.  Monitoring for safety.
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
## 2024-07-31 NOTE — ED Notes (Signed)
 Pt in AA meeting at present.
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
## 2024-07-31 NOTE — ED Notes (Signed)
 Vol.  Arrived to [facility] bed 163 as direct admit from assessment.  Sad affect depressed mood.  Reported [place] from [place] [address] and was throwing up most of the way here.  Pt reports wants to get clean Denied current SI plan and intent.  Denied HI and A.V hallucinations Q 15 minute observations  in place for safety

## 2024-08-01 DIAGNOSIS — F1494 Cocaine use, unspecified with cocaine-induced mood disorder: Secondary | ICD-10-CM | POA: Diagnosis not present

## 2024-08-01 LAB — CBC WITH DIFFERENTIAL/PLATELET
Abs Immature Granulocytes: 0.01 K/uL (ref 0.00–0.07)
Basophils Absolute: 0 K/uL (ref 0.0–0.1)
Basophils Relative: 0 %
Eosinophils Absolute: 0.1 K/uL (ref 0.0–0.5)
Eosinophils Relative: 2 %
HCT: 38.5 % (ref 36.0–46.0)
Hemoglobin: 13 g/dL (ref 12.0–15.0)
Immature Granulocytes: 0 %
Lymphocytes Relative: 21 %
Lymphs Abs: 1.4 K/uL (ref 0.7–4.0)
MCH: 29.2 pg (ref 26.0–34.0)
MCHC: 33.8 g/dL (ref 30.0–36.0)
MCV: 86.5 fL (ref 80.0–100.0)
Monocytes Absolute: 0.4 K/uL (ref 0.1–1.0)
Monocytes Relative: 6 %
Neutro Abs: 4.6 K/uL (ref 1.7–7.7)
Neutrophils Relative %: 71 %
Platelets: 218 K/uL (ref 150–400)
RBC: 4.45 MIL/uL (ref 3.87–5.11)
RDW: 12.4 % (ref 11.5–15.5)
WBC: 6.5 K/uL (ref 4.0–10.5)
nRBC: 0 % (ref 0.0–0.2)

## 2024-08-01 LAB — HEMOGLOBIN A1C
Hgb A1c MFr Bld: 5.2 % (ref 4.8–5.6)
Mean Plasma Glucose: 102.54 mg/dL

## 2024-08-01 LAB — COMPREHENSIVE METABOLIC PANEL WITH GFR
ALT: 14 U/L (ref 0–44)
AST: 18 U/L (ref 15–41)
Albumin: 3.6 g/dL (ref 3.5–5.0)
Alkaline Phosphatase: 112 U/L (ref 38–126)
Anion gap: 12 (ref 5–15)
BUN: 13 mg/dL (ref 6–20)
CO2: 23 mmol/L (ref 22–32)
Calcium: 9.1 mg/dL (ref 8.9–10.3)
Chloride: 101 mmol/L (ref 98–111)
Creatinine, Ser: 0.91 mg/dL (ref 0.44–1.00)
GFR, Estimated: >60 mL/min (ref >60)
Glucose, Bld: 100 mg/dL — ABNORMAL HIGH (ref 70–99)
Potassium: 3.1 mmol/L — ABNORMAL LOW (ref 3.5–5.1)
Sodium: 136 mmol/L (ref 135–145)
Total Bilirubin: 0.6 mg/dL (ref 0.0–1.2)
Total Protein: 6.5 g/dL (ref 6.5–8.1)

## 2024-08-01 LAB — TSH: TSH: 1.071 u[IU]/mL (ref 0.350–4.500)

## 2024-08-01 LAB — VITAMIN D 25 HYDROXY (VIT D DEFICIENCY, FRACTURES): Vit D, 25-Hydroxy: 35.17 ng/mL (ref 30–100)

## 2024-08-01 LAB — LIPID PANEL
Cholesterol: 190 mg/dL (ref 0–200)
HDL: 50 mg/dL (ref >40)
LDL Cholesterol: 129 mg/dL — ABNORMAL HIGH (ref 0–99)
Total CHOL/HDL Ratio: 3.8 ratio
Triglycerides: 55 mg/dL (ref <150)
VLDL: 11 mg/dL (ref 0–40)

## 2024-08-01 LAB — VITAMIN B12: Vitamin B-12: 331 pg/mL (ref 180–914)

## 2024-08-01 MED ORDER — POTASSIUM CHLORIDE CRYS ER 20 MEQ PO TBCR
40.0000 meq | EXTENDED_RELEASE_TABLET | Freq: Once | ORAL | Status: AC
  Administered 2024-08-01: 40 meq via ORAL
  Filled 2024-08-01: qty 2
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
## 2024-08-01 NOTE — Progress Notes (Signed)
 Meal given at [facility]

## 2024-08-01 NOTE — ED Notes (Signed)
 Pt sleeping at present, no distress noted, monitoring for safety.

## 2024-08-01 NOTE — Group Note (Signed)
 Group Topic: Positive Affirmations  Group Date: 08/01/2024 Start Time: 1300 End Time: 1345 Facilitators: Elnor Keven SAILOR  Department: [facility]  Number of Participants: 7  Group Focus: affirmation Treatment Modality:  Psychoeducation Interventions utilized were physical activity, support Purpose: reinforce self-care  Name: Regina Daniel Date of Birth: 25-Jun-1971  MR: [id number]    Level of Participation: active Quality of Participation: attentive Interactions with others: gave feedback Mood/Affect: appropriate Triggers (if applicable): NA Cognition: coherent/clear Progress: Significant Response: Pt shares she is grateful for freedom. Her affirmation of choice was I deserve the best.  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

## 2024-08-01 NOTE — Group Note (Signed)
 Group Topic: Change and Accountability  Group Date: 08/01/2024 Start Time: 8069 End Time: 2000 Facilitators: Anice Benton LABOR, NT  Department: [facility]  Number of Participants: 4  Group Focus: acceptance and clarity of thought Treatment Modality:  Individual Therapy Interventions utilized were assignment Purpose: increase insight  Name: Regina Daniel Date of Birth: January 23, 1971  MR: [id number]    Level of Participation: active Quality of Participation: attentive and positive  Interactions with others: gave feedback Mood/Affect: appropriate Triggers (if applicable): N/A Cognition: coherent/clear Progress: Moderate Response: Good Plan: follow-up needed  Patients Problems:  Patient Active Problem List   Diagnosis Date Noted   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

## 2024-08-01 NOTE — Progress Notes (Signed)
 Meal given

## 2024-08-01 NOTE — ED Notes (Signed)
 Patient was awake for breakfast.  She is calm and pleasant and reports feeling better with decreased nausea and no reports of vomiting.  Patient continues to appear sad with constricted affect.  Patient denies avh shi or plan.  Makes needs known and easily approachable.  Will monitor.

## 2024-08-02 DIAGNOSIS — F1494 Cocaine use, unspecified with cocaine-induced mood disorder: Secondary | ICD-10-CM | POA: Diagnosis not present

## 2024-08-02 NOTE — ED Notes (Signed)
 Medications reviewed with pt, questions denied. Pt administered prn atarax  for c/o anxiety prior to scheduled am medications. Pt reports to have eaten breakfast. Pt denied physical pain and discomforts at medication window. Pt denied si, verbal contract for safety provided. Pt also reports to not have had BM for a few days, declined intervention/ medication for constipation.
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
## 2024-08-02 NOTE — ED Notes (Signed)
 Paitent had dinner.

## 2024-08-02 NOTE — ED Notes (Signed)
 Pt administered prn atarax  for c/o anxiety. Pt currently in dayroom eating breakfast.

## 2024-08-02 NOTE — ED Notes (Signed)
 Pt c/o anxiety, visibly appears anxious. Pt administered prn atarax  as requested.

## 2024-08-02 NOTE — ED Notes (Signed)
 Paitent did not attend group AA meeting.

## 2024-08-02 NOTE — ED Notes (Signed)
 Paitent had lunch.

## 2024-08-02 NOTE — Group Note (Signed)
 Group Topic: Understanding Self  Group Date: 08/02/2024 Start Time: 1210 End Time: 1230 Facilitators: Daved Tinnie HERO, RN  Department: [facility]  Number of Participants: 9  Group Focus: nursing group Treatment Modality:  Psychoeducation Interventions utilized were exploration Purpose: increase insight  Name: Regina Daniel Date of Birth: 12/31/1970  MR: [id number]    Level of Participation: active Quality of Participation: attentive and cooperative Interactions with others: gave feedback Mood/Affect: flat, irritable  Triggers (if applicable): n/a Cognition: coherent/clear Progress: Gaining insight Response: pt explored maslows hierarchy of needs, says they need to improve, social connections Plan: patient will be encouraged to attend future RN education groups  Patients Problems:  Patient Active Problem List   Diagnosis Date Noted   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

## 2024-08-02 NOTE — ED Notes (Signed)
 Pt reported that prn atarax  was helpful in decreasing anxiety level, however, facial expression continues to display anxiety.

## 2024-08-02 NOTE — ED Notes (Signed)
 RN informed by MD that pt will be leaving AMA. Pt completed AMA form and safety plan.

## 2024-08-02 NOTE — ED Notes (Signed)
 Pt was escorted off unit by staff to lobby, pt says she is taking the bus. Pt items returned.

## 2024-08-02 NOTE — Care Management (Addendum)
 [facility] Care Management  Writer met with the patient and discussed discharge planning.  Patient request [facility] [place] and [facility].  Patient requests to discharge on Tuesday 08-02-2024  Writer provided patient with [facility] and [facility] resources.  Patient will contact these programs to complete an phone intake interviews for placement.   Patient reports that she relapsed after 1 year of sobriety.  Patient reports that she is employed at [employer] in [place].  Patient reports that her last use of cocaine was on 08-01-24.  Patient reports that she is able to return to living with her Uncle in [place].  The address to her [place] is [address] in [place]   Patient declined inpatient substance abuse treatment or intensive outpatient therapy.

## 2024-08-02 NOTE — Group Note (Signed)
 Group Topic: Fears and Unhealthy Coping Skills  Group Date: 08/02/2024 Start Time: 0900 End Time: 0950 Facilitators: Lonzell Dwayne RAMAN, NT  Department: [facility]  Number of Participants: 7  Group Focus: chemical dependency issues Treatment Modality:  Patient-Centered Therapy Interventions utilized were group exercise Purpose: reinforce self-care  Name: Regina Daniel Date of Birth: [birth year]  MR: [id number]    Level of Participation: Patient did attend group Quality of Participation: attentive Interactions with others: gave feedback Mood/Affect: positive Triggers (if applicable): N/A Cognition: coherent/clear Progress: Gaining insight Response: appropriate Plan: follow-up needed  Patients Problems:  Patient Active Problem List   Diagnosis Date Noted   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

## 2024-09-06 ENCOUNTER — Encounter: Payer: Self-pay | Admitting: Radiology

## 2024-09-21 ENCOUNTER — Encounter (HOSPITAL_COMMUNITY): Payer: Self-pay

## 2024-09-21 ENCOUNTER — Emergency Department (HOSPITAL_COMMUNITY)
Admission: EM | Admit: 2024-09-21 | Discharge: 2024-09-21 | Disposition: A | Discharge status: Home / Self Care | Attending: Emergency Medicine | Admitting: Emergency Medicine

## 2024-09-21 DIAGNOSIS — L0231 Cutaneous abscess of buttock: Secondary | ICD-10-CM | POA: Insufficient documentation

## 2024-09-21 DIAGNOSIS — Z79899 Other long term (current) drug therapy: Secondary | ICD-10-CM | POA: Insufficient documentation

## 2024-09-21 LAB — CBC WITH DIFFERENTIAL/PLATELET
Abs Immature Granulocytes: 0.03 K/uL (ref 0.00–0.07)
Basophils Absolute: 0.1 K/uL (ref 0.0–0.1)
Basophils Relative: 1 %
Eosinophils Absolute: 0.2 K/uL (ref 0.0–0.5)
Eosinophils Relative: 3 %
HCT: 38.8 % (ref 36.0–46.0)
Hemoglobin: 12.8 g/dL (ref 12.0–15.0)
Immature Granulocytes: 1 %
Lymphocytes Relative: 20 %
Lymphs Abs: 1.2 K/uL (ref 0.7–4.0)
MCH: 29.3 pg (ref 26.0–34.0)
MCHC: 33 g/dL (ref 30.0–36.0)
MCV: 88.8 fL (ref 80.0–100.0)
Monocytes Absolute: 0.5 K/uL (ref 0.1–1.0)
Monocytes Relative: 8 %
Neutro Abs: 4 K/uL (ref 1.7–7.7)
Neutrophils Relative %: 67 %
Platelets: 218 K/uL (ref 150–400)
RBC: 4.37 MIL/uL (ref 3.87–5.11)
RDW: 12.6 % (ref 11.5–15.5)
WBC: 5.9 K/uL (ref 4.0–10.5)
nRBC: 0 % (ref 0.0–0.2)

## 2024-09-21 LAB — COMPREHENSIVE METABOLIC PANEL WITH GFR
ALT: 16 U/L (ref 0–44)
AST: 21 U/L (ref 15–41)
Albumin: 3.4 g/dL — ABNORMAL LOW (ref 3.5–5.0)
Alkaline Phosphatase: 108 U/L (ref 38–126)
Anion gap: 13 (ref 5–15)
BUN: 12 mg/dL (ref 6–20)
CO2: 19 mmol/L — ABNORMAL LOW (ref 22–32)
Calcium: 9.4 mg/dL (ref 8.9–10.3)
Chloride: 105 mmol/L (ref 98–111)
Creatinine, Ser: 0.87 mg/dL (ref 0.44–1.00)
GFR, Estimated: >60 mL/min (ref >60)
Glucose, Bld: 89 mg/dL (ref 70–99)
Potassium: 4 mmol/L (ref 3.5–5.1)
Sodium: 137 mmol/L (ref 135–145)
Total Bilirubin: 0.3 mg/dL (ref 0.0–1.2)
Total Protein: 6.4 g/dL — ABNORMAL LOW (ref 6.5–8.1)

## 2024-09-21 LAB — URINALYSIS, W/ REFLEX TO CULTURE (INFECTION SUSPECTED)
Bilirubin Urine: NEGATIVE
Glucose, UA: NEGATIVE mg/dL
Hgb urine dipstick: NEGATIVE
Ketones, ur: NEGATIVE mg/dL
Leukocytes,Ua: NEGATIVE
Nitrite: NEGATIVE
Protein, ur: NEGATIVE mg/dL
Specific Gravity, Urine: 1.017 (ref 1.005–1.030)
pH: 5 (ref 5.0–8.0)

## 2024-09-21 LAB — I-STAT CG4 LACTIC ACID, ED: Lactic Acid, Venous: 1.3 mmol/L (ref 0.5–1.9)

## 2024-09-21 MED ORDER — LIDOCAINE-EPINEPHRINE (PF) 2 %-1:200000 IJ SOLN
10.0000 mL | Freq: Once | INTRAMUSCULAR | Status: AC
  Administered 2024-09-21: 10 mL
  Filled 2024-09-21: qty 20

## 2024-09-21 NOTE — Discharge Instructions (Signed)
 Keep the wound clean and dry and do not submerge in water until the incision has closed. Continue taking your antibiotic until you are able to follow up with your primary doctor. You should change the bandage tomorrow after showering.

## 2024-09-21 NOTE — ED Triage Notes (Signed)
 Pt reports abscess left butt cheek that first appeared a week ago. Started taking sulfa  antibiotic yesterday and reports abscess has increased in size and more painful since.

## 2024-09-21 NOTE — ED Provider Triage Note (Signed)
 Emergency Medicine Provider Triage Evaluation Note  Regina Daniel , a 53 y.o. female  was evaluated in triage.  Pt complains of left buttock abscess 2-3cm on Bractrim for 1.5days, no getting better. Interested in I&D! No IVDU, no fever  Review of Systems  Positive: Abscess  Negative: Fever  Physical Exam  BP 102/66   Pulse 86   Temp 97.9 F (36.6 C) (Oral)   Resp 20   LMP 11/04/2010   SpO2 98%  Gen:   Awake, no distress   Resp:  Normal effort  MSK:   Moves extremities without difficulty  Other:    Medical Decision Making  Medically screening exam initiated at 12:30 PM.  Appropriate orders placed.  Regina Daniel was informed that the remainder of the evaluation will be completed by another provider, this initial triage assessment does not replace that evaluation, and the importance of remaining in the ED until their evaluation is complete.     Shermon Warren SAILOR, PA-C 09/21/24 1231

## 2024-09-21 NOTE — ED Triage Notes (Signed)
 Pt to er, pt states that she was seen at her pmd yesterday and told that she has MRSA, pt states that she is here for an abscess on her lower back/buttocks.  Pt states that she has had the abscess for the past week.

## 2024-09-21 NOTE — ED Provider Notes (Signed)
 [place] EMERGENCY DEPARTMENT AT [facility] Provider Note   CSN: [id number] Arrival date & time: 09/21/24  1142     Patient presents with: Abscess   Regina Daniel is a 53 y.o. female.   Noncontributory past medical history presenting with abscess x 1 week on the left buttock.  She was seen yesterday in her primary care doctor's office who prescribed Bactrim .  She has had 3 doses of Bactrim  and feels that the abscess has only gotten bigger.  She has had a prior abscess in the past on the opposite buttock which drained spontaneously.  Denies fevers, chills, other systemic symptoms though does report she has hot flashes so is uncertain.   Abscess      Prior to Admission medications   Medication Sig Start Date End Date Taking? Authorizing Provider  gabapentin  (NEURONTIN ) 300 MG capsule Take 300 mg by mouth 2 (two) times daily.    [provider]  hydrOXYzine  (ATARAX ) 25 MG tablet Take 25 mg by mouth 3 (three) times daily as needed.    [provider]  hydrOXYzine  (VISTARIL ) 25 MG capsule Take 25 mg by mouth in the morning. & 50mg  po QHS    [provider]  nicotine  (NICODERM CQ  - DOSED IN MG/24 HOURS) 21 mg/24hr patch Place 21 mg onto the skin daily.    [provider]  prazosin  (MINIPRESS ) 2 MG capsule Take 2 mg by mouth at bedtime.    [provider]  QUEtiapine  (SEROQUEL ) 100 MG tablet Take 100 mg by mouth at bedtime.    [provider]    Allergies: Patient has no known allergies.    Review of Systems  Updated Vital Signs BP 102/66   Pulse 86   Temp 97.8 F (36.6 C) (Oral)   Resp 20   LMP 11/04/2010   SpO2 98%   Physical Exam Constitutional:      Appearance: Normal appearance.  Cardiovascular:     Rate and Rhythm: Normal rate and regular rhythm.  Pulmonary:     Effort: Pulmonary effort is normal.     Breath sounds: Normal breath sounds.  Skin:    General: Skin is warm and dry.     Findings:  Abscess present.      Neurological:     Mental Status: She is alert.     (all labs ordered are listed, but only abnormal results are displayed) Labs Reviewed  COMPREHENSIVE METABOLIC PANEL WITH GFR - Abnormal; Notable for the following components:      Result Value   CO2 19 (*)    Total Protein 6.4 (*)    Albumin 3.4 (*)    All other components within normal limits  URINALYSIS, W/ REFLEX TO CULTURE (INFECTION SUSPECTED) - Abnormal; Notable for the following components:   APPearance HAZY (*)    Bacteria, UA RARE (*)    Non Squamous Epithelial 0-5 (*)    All other components within normal limits  CBC WITH DIFFERENTIAL/PLATELET  I-STAT CG4 LACTIC ACID, ED    EKG: None  Radiology: No results found.   .Incision and Drainage  Date/Time: 09/21/2024 5:21 PM  Performed by: Cleotilde Lukes, DO Authorized by: Darra Fonda MATSU, MD   Consent:    Consent obtained:  Verbal   Consent given by:  Patient   Risks discussed:  Bleeding, pain and infection Universal protocol:    Procedure explained and questions answered to patient or proxy's satisfaction: yes     Site/side marked: yes  Patient identity confirmed:  Verbally with patient and arm band Location:    Type:  Abscess   Location:  Lower extremity   Lower extremity location:  Buttock   Buttock location:  L buttock Pre-procedure details:    Skin preparation:  Povidone-iodine Sedation:    Sedation type:  None Anesthesia:    Anesthesia method:  Local infiltration   Local anesthetic:  Lidocaine  2% WITH epi Procedure type:    Complexity:  Simple Procedure details:    Ultrasound guidance: no     Needle aspiration: no     Incision types:  Single straight   Incision depth:  Dermal   Wound management:  Probed and deloculated   Drainage:  Bloody and purulent   Drainage amount:  Moderate   Wound treatment:  Wound left open   Packing materials:  None Post-procedure details:    Procedure completion:  Tolerated well, no  immediate complications    Medications Ordered in the ED  lidocaine -EPINEPHrine  (XYLOCAINE  W/EPI) 2 %-1:200000 (PF) injection 10 mL (has no administration in time range)                                    Medical Decision Making This is a 53 year old previously healthy female patient with a well-circumscribed 2 cm x 2 cm abscess present on the left buttock.  It is not associated with the anus and does have a head for spontaneous drainage.  Given the reported increase in size we will proceed with drainage of the abscess here in the emergency department and recommend follow-up with her primary doctor.  Patient bandaged following incision and drainage.  Tolerated well.  Wound too shallow for packing, left open.  Amount and/or Complexity of Data Reviewed Labs: ordered.  Risk Prescription drug management.   Final diagnoses:  None    ED Discharge Orders     None          Cleotilde Lukes, DO 09/21/24 1724    Darra Fonda MATSU, MD 09/25/24 [phone]
